# Patient Record
Sex: Male | Born: 1955 | Race: White | Hispanic: No | Marital: Married | Smoking: Former smoker
Health system: Southern US, Community
[De-identification: ages and names within clinical notes are randomized; demographics above are authoritative.]

## PROBLEM LIST (undated history)

## (undated) DIAGNOSIS — N200 Calculus of kidney: Secondary | ICD-10-CM

## (undated) DIAGNOSIS — F0390 Unspecified dementia without behavioral disturbance: Secondary | ICD-10-CM

## (undated) DIAGNOSIS — N132 Hydronephrosis with renal and ureteral calculous obstruction: Secondary | ICD-10-CM

## (undated) DIAGNOSIS — D649 Anemia, unspecified: Secondary | ICD-10-CM

## (undated) HISTORY — PX: NEPHRECTOMY: SHX65

## (undated) NOTE — *Deleted (*Deleted)
Transition of Care [facility]) - CM/SW Discharge Note   Patient Details  Name: Gerald Diaz MRN: [id number] Date of Birth: 07-15-1955  [facility]) CM/SW Contact:  Epifanio Lesches, RN Phone Number: [phone] 02/10/2020, 12:19 PM   Clinical Narrative:    Patient will DC to: home  With 24/7 caregivers Anticipated DC date: 02/10/2020 Family notified: [name] ( guardian) at [phone] Transport by: car   Per MD patient ready for DC today . RN, patient, patient's guardian,  notified of DC.  RNCM will sign off for now as intervention is no longer needed. Please consult [place] again if new needs arise.     Barriers to Discharge: No Barriers Identified   Patient Goals and CMS Choice Patient states their goals for this hospitalization and ongoing recovery are:: return home w/ caregivers CMS Medicare.gov Compare Post Acute Care list provided to::  (n/a) Choice offered to / list presented to : [facility] POA / Guardian  Discharge Placement                       Discharge Plan and Services In-house Referral: Clinical Social Work Discharge Planning Services: CM Consult Post Acute Care Choice: Resumption of Svcs/PTA Provider                               Social Determinants of Health (SDOH) Interventions     Readmission Risk Interventions Readmission Risk Prevention Plan 02/09/2020  Post Dischage Appt Not Complete  Appt Comments d/c date tbd  Medication Screening Complete  Transportation Screening Complete  Some recent data might be hidden

---

## 1997-10-11 ENCOUNTER — Ambulatory Visit (HOSPITAL_COMMUNITY): Admission: RE | Admit: 1997-10-11 | Discharge: 1997-10-11 | Payer: Self-pay

## 2001-05-01 ENCOUNTER — Emergency Department (HOSPITAL_COMMUNITY): Admission: EM | Admit: 2001-05-01 | Discharge: 2001-05-01 | Payer: Self-pay | Admitting: Emergency Medicine

## 2001-05-08 ENCOUNTER — Emergency Department (HOSPITAL_COMMUNITY): Admission: EM | Admit: 2001-05-08 | Discharge: 2001-05-08 | Payer: Self-pay | Admitting: Emergency Medicine

## 2004-02-16 ENCOUNTER — Ambulatory Visit (HOSPITAL_COMMUNITY): Admission: RE | Admit: 2004-02-16 | Discharge: 2004-02-16 | Payer: Self-pay | Admitting: Orthopedic Surgery

## 2004-02-19 ENCOUNTER — Ambulatory Visit (HOSPITAL_BASED_OUTPATIENT_CLINIC_OR_DEPARTMENT_OTHER): Admission: RE | Admit: 2004-02-19 | Discharge: 2004-02-19 | Payer: Self-pay | Admitting: Orthopedic Surgery

## 2004-10-11 ENCOUNTER — Emergency Department (HOSPITAL_COMMUNITY): Admission: EM | Admit: 2004-10-11 | Discharge: 2004-10-11 | Payer: Self-pay | Admitting: *Deleted

## 2005-06-09 ENCOUNTER — Encounter: Admission: RE | Admit: 2005-06-09 | Discharge: 2005-06-09 | Payer: Self-pay | Admitting: Sports Medicine

## 2009-02-27 ENCOUNTER — Ambulatory Visit: Payer: Self-pay | Admitting: Oncology

## 2009-03-13 LAB — CBC WITH DIFFERENTIAL (CANCER CENTER ONLY)
BASO#: 0.1 10*3/uL (ref 0.0–0.2)
BASO%: 0.8 % (ref 0.0–2.0)
EOS%: 7.8 % — ABNORMAL HIGH (ref 0.0–7.0)
Eosinophils Absolute: 0.5 10*3/uL (ref 0.0–0.5)
HCT: 43.4 % (ref 38.7–49.9)
HGB: 14.9 g/dL (ref 13.0–17.1)
LYMPH#: 1.5 10*3/uL (ref 0.9–3.3)
LYMPH%: 22.8 % (ref 14.0–48.0)
MCH: 33.6 pg — ABNORMAL HIGH (ref 28.0–33.4)
MCHC: 34.2 g/dL (ref 32.0–35.9)
MCV: 98 fL (ref 82–98)
MONO#: 0.4 10*3/uL (ref 0.1–0.9)
MONO%: 6.1 % (ref 0.0–13.0)
NEUT#: 4 10*3/uL (ref 1.5–6.5)
NEUT%: 62.5 % (ref 40.0–80.0)
Platelets: 197 10*3/uL (ref 145–400)
RBC: 4.42 10*6/uL (ref 4.20–5.70)
RDW: 11.4 % (ref 10.5–14.6)
WBC: 6.4 10*3/uL (ref 4.0–10.0)

## 2009-03-13 LAB — MORPHOLOGY - CHCC SATELLITE
PLT EST ~~LOC~~: ADEQUATE
Platelet Morphology: NORMAL

## 2009-03-13 LAB — URINALYSIS, MICROSCOPIC (CHCC SATELLITE)
Bacteria, UA: NEGATIVE
Bilirubin (Urine): NEGATIVE
Blood: NEGATIVE
Epithelial Cells: NONE SEEN
Glucose: NEGATIVE g/dL
Ketones: NEGATIVE mg/dL
Leukocyte Esterase: NEGATIVE
Mucus: NONE SEEN
Nitrite: NEGATIVE
Protein: NEGATIVE mg/dL
Specific Gravity, Urine: 1.015 (ref 1.003–1.035)
WBC: NEGATIVE (ref 0–2)
pH: 6 (ref 4.60–8.00)

## 2009-03-13 LAB — CMP (CANCER CENTER ONLY)
ALT(SGPT): 27 U/L (ref 10–47)
AST: 27 U/L (ref 11–38)
Albumin: 3.5 g/dL (ref 3.3–5.5)
Alkaline Phosphatase: 66 U/L (ref 26–84)
BUN, Bld: 27 mg/dL — ABNORMAL HIGH (ref 7–22)
CO2: 29 mEq/L (ref 18–33)
Calcium: 9.1 mg/dL (ref 8.0–10.3)
Chloride: 105 mEq/L (ref 98–108)
Creat: 0.8 mg/dl (ref 0.6–1.2)
Glucose, Bld: 86 mg/dL (ref 73–118)
Potassium: 4.6 mEq/L (ref 3.3–4.7)
Sodium: 144 mEq/L (ref 128–145)
Total Bilirubin: 0.6 mg/dl (ref 0.20–1.60)
Total Protein: 6.9 g/dL (ref 6.4–8.1)

## 2009-03-13 LAB — LACTATE DEHYDROGENASE: LDH: 141 U/L (ref 94–250)

## 2009-03-15 LAB — FOLATE: Folate: >20 ng/mL

## 2009-03-15 LAB — DIRECT ANTIGLOBULIN TEST (NOT AT ARMC)
DAT (Complement): NEGATIVE
DAT IgG: NEGATIVE

## 2009-03-15 LAB — PROTEIN ELECTROPHORESIS, SERUM
Albumin ELP: 62.4 % (ref 55.8–66.1)
Alpha-1-Globulin: 3.7 % (ref 2.9–4.9)
Alpha-2-Globulin: 8.8 % (ref 7.1–11.8)
Beta 2: 6.3 % (ref 3.2–6.5)
Beta Globulin: 5.7 % (ref 4.7–7.2)
Gamma Globulin: 13.1 % (ref 11.1–18.8)
Total Protein, Serum Electrophoresis: 6.7 g/dL (ref 6.0–8.3)

## 2009-03-15 LAB — RETICULOCYTES (CHCC)
ABS Retic: 31.6 10*3/uL (ref 19.0–186.0)
RBC.: 4.52 MIL/uL (ref 4.22–5.81)
Retic Ct Pct: 0.7 % (ref 0.4–3.1)

## 2009-03-15 LAB — HAPTOGLOBIN: Haptoglobin: 87 mg/dL (ref 16–200)

## 2009-03-15 LAB — FERRITIN: Ferritin: 111 ng/mL (ref 22–322)

## 2009-03-15 LAB — IRON AND TIBC
%SAT: 33 % (ref 20–55)
Iron: 101 ug/dL (ref 42–165)
TIBC: 310 ug/dL (ref 215–435)
UIBC: 209 ug/dL

## 2009-03-15 LAB — ERYTHROPOIETIN: Erythropoietin: 16.5 m[IU]/mL (ref 2.6–34.0)

## 2009-03-15 LAB — VITAMIN B12: Vitamin B-12: 815 pg/mL (ref 211–911)

## 2009-04-18 ENCOUNTER — Ambulatory Visit: Payer: Self-pay | Admitting: Oncology

## 2009-04-23 LAB — CBC WITH DIFFERENTIAL (CANCER CENTER ONLY)
BASO#: 0.1 10*3/uL (ref 0.0–0.2)
BASO%: 0.8 % (ref 0.0–2.0)
EOS%: 7.4 % — ABNORMAL HIGH (ref 0.0–7.0)
Eosinophils Absolute: 0.6 10*3/uL — ABNORMAL HIGH (ref 0.0–0.5)
HCT: 41.7 % (ref 38.7–49.9)
HGB: 14.1 g/dL (ref 13.0–17.1)
LYMPH#: 1.9 10*3/uL (ref 0.9–3.3)
LYMPH%: 24.4 % (ref 14.0–48.0)
MCH: 33.5 pg — ABNORMAL HIGH (ref 28.0–33.4)
MCHC: 33.9 g/dL (ref 32.0–35.9)
MCV: 99 fL — ABNORMAL HIGH (ref 82–98)
MONO#: 0.5 10*3/uL (ref 0.1–0.9)
MONO%: 6.4 % (ref 0.0–13.0)
NEUT#: 4.7 10*3/uL (ref 1.5–6.5)
NEUT%: 61 % (ref 40.0–80.0)
Platelets: 193 10*3/uL (ref 145–400)
RBC: 4.23 10*6/uL (ref 4.20–5.70)
RDW: 11.5 % (ref 10.5–14.6)
WBC: 7.7 10*3/uL (ref 4.0–10.0)

## 2010-09-19 NOTE — Op Note (Signed)
NAMEKAHLEB, MCCLANE NO.:  [id number]   MEDICAL RECORD NO.:  [id number]          PATIENT TYPE:  AMB   LOCATION:  DSC                          FACILITY:  [facility]   PHYSICIAN:  Katy Fitch. Sypher Jr., M.D.DATE OF BIRTH:  01/05/1956   DATE OF PROCEDURE:  02/19/2004  DATE OF DISCHARGE:                                 OPERATIVE REPORT   PREOPERATIVE DIAGNOSIS:  Chronic acromioclavicular arthropathy, left  shoulder, with stage 2 impingement, chronic bursitis and pain.   POSTOPERATIVE DIAGNOSIS:  Chronic acromioclavicular arthropathy, left  shoulder, with stage 2 impingement, chronic bursitis and pain.   OPERATIONS:  1.  Diagnostic arthroscopy of left glenohumeral joint with a limited      synovectomy.  2.  Arthroscopic subacromial decompression with bursectomy, coracoacromial      ligament release and acromioplasty.  3.  Open left distal clavicle resection, i.e. Mumford procedure.   OPERATING SURGEON:  Katy Fitch. Sypher, M.D.   ASSISTANT:  Jonni Sanger, P.A.   ANESTHESIA:  General endotracheal supplemented by interscalene block.   SUPERVISING ANESTHESIOLOGIST:  Burna Forts, M.D.   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.   He has a history of a right rotator cuff tear treated in 2000 with  subsequent development of impingement on the right.  He is status post right  shoulder subacromial decompression, debridement and distal clavicle  resection with resolution of his right shoulder predicament.  He recently  presented with symptoms on the left suggestive of his right rotator cuff  tear.   Clinical examination revealed signs of impingement and AC arthropathy.  An  MRI of his shoulder documented tendinoplasty of the rotator cuff without  evidence of a retracted rotator cuff tear and profound edema of the distal  clavicle and advanced arthritis of the [facility] joint with prominent inferior   osteophytes.   We recommended to Mr. Atiyeh that he undergo arthroscopic evaluation of his  shoulder followed by distal clavicle resection and subacromial  decompression.  After informed consent, he is brought to the operating room  at this time.   He is quite familiar with the postoperative protocol and understands the  risks and benefits of this procedure given his past experience.   DESCRIPTION OF PROCEDURE:  Rashon Westrup is brought to the operating room and  placed in the supine position on the operating table.   Following the induction of general orotracheal anesthesia, he was carefully  positioned in the beach chair position with the aid of a torso and head  holder designed for shoulder arthroscopy.   The entire left arm and __________ were prepped with Duraprep and draped  with impervious arthroscopy drapes.   The shoulder was distended with 20 mL of sterile saline with a 20 gauge  spinal needle brought in through the anticipated posterior portal.  The  arthroscope was placed atraumatically through the posterior portal.   Diagnostic arthroscopy revealed intact hyalin articular cartilage surfaces  on the glenoid and humeral head.   The anterior capsular ligaments were normal with  early adhesive capsulitis  tissues noted and rather redundant synovium hanging within the joint.  The  biceps origin was stable and the biceps tendon was normal through the  rotator interval.  The subscapularis insertion was normal.  The  supraspinatus insertion had some mild degenerative tendinoplasty without a  retracted tear and the infraspinatus was normal in appearance.  The  condition of the rotator cuff was documented with a digital camera.   A suction shaver was used to perform an anterior synovectomy and hemostasis  was achieved with the radiofrequency probe.   The arthroscopic equipment was removed from the glenohumeral joint and  placed in the subacromial space.   There was a florid  bursitis noted.   After bursectomy, the anatomy of the coracoacromial arch was studied.   There was a type 2 acromion with a rather prominent anteromedial lip  adjacent to the [facility] joint.  The inferior clavicle was prominent.  The capsule  of the [facility] joint was taken down with the cutting cautery followed by use of a  suction bur to level the acromion to a type 1 morphology.  The  coracoacromial ligament was released and electrocauterized with bipolar  cautery.   The bursa was thoroughly cleaned at the rotator cuff and no sign of  retracted tear was identified.   Attention was then directed to distal clavicle resection.  A 2 cm incision  was fashioned directly over the distal clavicle.  Subcutaneous tissues were  incised sharply revealing the [facility] joint capsule.  This was elevated with a 15  blade and small osteotome circumferentially to allow resection of the distal  15 mm of clavicle.   Baby Bennett retractors were placed protecting the deltoid and trapezius  muscles followed by use of an oscillating saw to remove the distal 5 mm of  the clavicle.   Bleeding points were electrocauterized with bipolar current followed by  repair of the dead space with mattress sutures of #2 fiber wire closing the  anterior deltoid muscle fibers to the trapezius posteriorly.   The skin was repaired with subdural sutures of 2-0 Vicryl.   The scope was replaced in the subacromial space and the acromion was burred  to a flat surface with the bur brought in posteriorly and visualization  laterally.  Once again a satisfactory contouring of the acromion was  documented followed by inspection of the cuff.  No sign of retracted tear  was noted.   The arthroscopic equipment was then removed followed by repair of the  portals with intradermal 3-0 Prolene and repair of the skin incision for  distal clavicle resection with intradermal 3-0 Prolene.  The wounds were dressed with Xeroflo, sterile gauze and a  Hypafix dressing.   Mr. Draheim will be discharged home with a prescription for Dilaudid 2 mg one  to two tablets p.o. q.4-6h. p.r.n. pain and also Keflex 500 mg one p.o.  q.8h. x 4 days for prophylactic antibiotic and Motrin 600 mg one p.o. q.6h.  p.r.n. pain, 30 tablets with one refill.       RVS/MEDQ  D:  02/19/2004  T:  02/19/2004  Job:  [id number]

## 2010-10-15 ENCOUNTER — Emergency Department (HOSPITAL_COMMUNITY)
Admission: EM | Admit: 2010-10-15 | Discharge: 2010-10-15 | Disposition: A | Discharge status: Home / Self Care | Payer: BC Managed Care – PPO | Attending: Emergency Medicine | Admitting: Emergency Medicine

## 2010-10-15 ENCOUNTER — Emergency Department (HOSPITAL_COMMUNITY): Payer: BC Managed Care – PPO

## 2010-10-15 DIAGNOSIS — R112 Nausea with vomiting, unspecified: Secondary | ICD-10-CM | POA: Insufficient documentation

## 2010-10-15 DIAGNOSIS — H81399 Other peripheral vertigo, unspecified ear: Secondary | ICD-10-CM | POA: Insufficient documentation

## 2010-10-15 LAB — BASIC METABOLIC PANEL
BUN: 17 mg/dL (ref 6–23)
CO2: 24 mEq/L (ref 19–32)
Calcium: 8.1 mg/dL — ABNORMAL LOW (ref 8.4–10.5)
Chloride: 107 mEq/L (ref 96–112)
Creatinine, Ser: 0.74 mg/dL (ref 0.4–1.5)
GFR calc Af Amer: >60 mL/min (ref >60)
GFR calc non Af Amer: >60 mL/min (ref >60)
Glucose, Bld: 161 mg/dL — ABNORMAL HIGH (ref 70–99)
Potassium: 3.7 mEq/L (ref 3.5–5.1)
Sodium: 141 mEq/L (ref 135–145)

## 2010-10-15 LAB — CBC
HCT: 41 % (ref 39.0–52.0)
Hemoglobin: 14.4 g/dL (ref 13.0–17.0)
MCH: 34.5 pg — ABNORMAL HIGH (ref 26.0–34.0)
MCHC: 35.1 g/dL (ref 30.0–36.0)
MCV: 98.3 fL (ref 78.0–100.0)
Platelets: 170 10*3/uL (ref 150–400)
RBC: 4.17 MIL/uL — ABNORMAL LOW (ref 4.22–5.81)
RDW: 12.5 % (ref 11.5–15.5)
WBC: 7.9 10*3/uL (ref 4.0–10.5)

## 2010-10-15 LAB — DIFFERENTIAL
Basophils Absolute: 0 10*3/uL (ref 0.0–0.1)
Basophils Relative: 1 % (ref 0–1)
Eosinophils Absolute: 0.3 10*3/uL (ref 0.0–0.7)
Eosinophils Relative: 3 % (ref 0–5)
Lymphocytes Relative: 22 % (ref 12–46)
Lymphs Abs: 1.7 10*3/uL (ref 0.7–4.0)
Monocytes Absolute: 0.7 10*3/uL (ref 0.1–1.0)
Monocytes Relative: 9 % (ref 3–12)
Neutro Abs: 5.2 10*3/uL (ref 1.7–7.7)
Neutrophils Relative %: 66 % (ref 43–77)

## 2010-10-15 LAB — ETHANOL: Alcohol, Ethyl (B): <11 mg/dL — ABNORMAL HIGH (ref 0–10)

## 2010-10-15 LAB — RAPID URINE DRUG SCREEN, HOSP PERFORMED
Amphetamines: NOT DETECTED
Barbiturates: NOT DETECTED
Benzodiazepines: NOT DETECTED
Cocaine: NOT DETECTED
Opiates: NOT DETECTED
Tetrahydrocannabinol: NOT DETECTED

## 2010-10-15 LAB — CK TOTAL AND CKMB (NOT AT ARMC)
CK, MB: 2.5 ng/mL (ref 0.3–4.0)
Relative Index: INVALID (ref 0.0–2.5)
Total CK: 78 U/L (ref 7–232)

## 2010-10-15 LAB — TROPONIN I: Troponin I: <0.3 ng/mL (ref <0.30)

## 2010-10-16 NOTE — Consult Note (Signed)
NAMELEOMAR, WESTBERG NO.:  [id number]  MEDICAL RECORD NO.:  [id number]  LOCATION:  [facility]                         FACILITY:  [facility]  PHYSICIAN:  Levie Heritage, MD       DATE OF BIRTH:  1955/08/09  DATE OF CONSULTATION:  10/15/2010 DATE OF DISCHARGE:  10/15/2010                                CONSULTATION   REFERRING PHYSICIAN:  ER Team.  REASON FOR CONSULTATION:  Code stroke.  CHIEF COMPLAINT:  Sudden vertigo.  HISTORY OF PRESENT ILLNESS:  This patient is a 55 year old man with otherwise no obvious health issues who had sudden onset of vertigo feelings since 7:30 a.m. this morning.  He states that this was triggered after he was brushing his teeth and has not gone away, since then he has thrown up many times and has no additional neurological deficit in addition to the vertigo.  PAST MEDICAL HISTORY:  He does not have any significant health issues except having some shoulder surgery and infection of the kidney as a child.  ALLERGIES:  No known drug allergies.  FAMILY HISTORY:  He denies any vascular path issues in the family with no obvious cardiac problems, strokes or diabetes mellitus.  REVIEW OF SYSTEMS:  Denies any chest pain.  Denies any shortness of breath.  Denies any pain anywhere.  Denies any motor weakness.  No issues with the vision.  No problems with the sensory system.  He does have obvious vomiting and is carrying a bag in his hand to vomit.  He is feeling nauseous.  No recent fever.  No rashes.  No recent burning urination or loss of bladder or bowel control function.  No recent flu- like symptoms.  No recent weight loss.  Rest of 10-organ review of system unremarkable.  SOCIAL HISTORY:  He is retired from [employer].  He is nonsmoker.  He is not married.  He lives alone.  Denies any alcohol abuse or drug abuse.  REVIEW OF CLINICAL DATA:  I have seen his workup today and CBC has been pretty unremarkable.  Alcohol is also normal.  Glucose is  mildly high and CK and CK-MB is normal as well as negative troponins. MRI of the brain was requested for the suspicion of acute stroke and the quick limited DWR sequence did not show any acute abnormality, although official report is pending.  PHYSICAL EXAMINATION:  Currently lying down on the bed with vomiting bag in his hand and is still throwing up. Awake, oriented x3, mild distress of vomiting and nausea repeatedly. Bilateral pupils are reactive to light and accommodation with no field cut.  Moves eyes to all direction.  Symmetrical face for sensation and strength testing.  Midline tongue without atrophy or fasciculation. Palate elevates symmetrically with midline uvula. Motor examination:  Strength is 5/5 all over. Sensory examination, admits to feel light touch sensation all over. I do not see any nystagmus.  I do not see any dysarthria.  I do not notice any aphasia. The NIH stroke scale currently is 0.  IMPRESSION:  This patient is a 55 year old man with sudden onset of nausea, vomiting and vertigo feeling started after brushing his teeth  this morning at 7:30 a.m.  His MRI of the brain is negative for any acute stroke. He was given Valium 2 mg in the ER to get through his MRI study which also to care of his nausea feeling. This good outcome with the Valium and the MRA finding in February 2007 are strongly in favor of peripheral cause of vertigo.  I do not see any central pathology otherwise. Some vascular loops touching the right vestibulocochlear nerve were noted in 2007, which could have been branches of right superior cerebellar artery. This could be the cause of his symptoms and further workup will be done in order to device management plan.  PLAN:  The patient needs an ENT evaluation for any other peripheral cause of vertigo as he did have previously pulsatile tinnitus in the past, but for now I think he will need to be discharged on p.r.n. basis of Valium and  meclizine. I think it is also important to get his CT angio of the intracranial vessels in order to evaluate further the blood vessels touching the vestibular nerve or possibly a conventional angiogram will be more helpful.  However, he needs to have a complete ENT evaluation for his symptoms first before planning any intervention. No further inpatient neurological workup, otherwise, recommended at this time.  I have discussed in detail with the ER Team about my impression in detail.  Please do not hesitate to contact me for any questions you may have on this patient.          ______________________________ Levie Heritage, MD     WS/MEDQ  D:  10/15/2010  T:  10/16/2010  Job:  [id number]  Electronically Signed by Levie Heritage MD on 10/16/2010 08:08:52 AM

## 2011-04-02 ENCOUNTER — Other Ambulatory Visit: Payer: Self-pay | Admitting: Emergency Medicine

## 2011-04-02 DIAGNOSIS — Z1211 Encounter for screening for malignant neoplasm of colon: Secondary | ICD-10-CM

## 2011-04-08 ENCOUNTER — Ambulatory Visit
Admission: RE | Admit: 2011-04-08 | Discharge: 2011-04-08 | Disposition: A | Discharge status: Home / Self Care | Payer: BC Managed Care – PPO | Source: Ambulatory Visit | Attending: Emergency Medicine | Admitting: Emergency Medicine

## 2011-04-08 DIAGNOSIS — Z1211 Encounter for screening for malignant neoplasm of colon: Secondary | ICD-10-CM

## 2016-06-17 ENCOUNTER — Encounter (HOSPITAL_COMMUNITY): Payer: Self-pay | Admitting: Emergency Medicine

## 2016-06-17 ENCOUNTER — Emergency Department (HOSPITAL_COMMUNITY)
Admission: EM | Admit: 2016-06-17 | Discharge: 2016-06-18 | Disposition: A | Discharge status: Home / Self Care | Payer: No Typology Code available for payment source | Attending: Emergency Medicine | Admitting: Emergency Medicine

## 2016-06-17 DIAGNOSIS — Y92009 Unspecified place in unspecified non-institutional (private) residence as the place of occurrence of the external cause: Secondary | ICD-10-CM | POA: Insufficient documentation

## 2016-06-17 DIAGNOSIS — Y939 Activity, unspecified: Secondary | ICD-10-CM | POA: Insufficient documentation

## 2016-06-17 DIAGNOSIS — W260XXA Contact with knife, initial encounter: Secondary | ICD-10-CM | POA: Insufficient documentation

## 2016-06-17 DIAGNOSIS — S61011A Laceration without foreign body of right thumb without damage to nail, initial encounter: Secondary | ICD-10-CM | POA: Diagnosis not present

## 2016-06-17 DIAGNOSIS — Z23 Encounter for immunization: Secondary | ICD-10-CM | POA: Diagnosis not present

## 2016-06-17 DIAGNOSIS — Y999 Unspecified external cause status: Secondary | ICD-10-CM | POA: Insufficient documentation

## 2016-06-17 HISTORY — DX: Anemia, unspecified: D64.9

## 2016-06-17 MED ORDER — TETANUS-DIPHTH-ACELL PERTUSSIS 5-2.5-18.5 LF-MCG/0.5 IM SUSP
0.5000 mL | Freq: Once | INTRAMUSCULAR | Status: AC
  Administered 2016-06-17: 0.5 mL via INTRAMUSCULAR
  Filled 2016-06-17: qty 0.5

## 2016-06-17 NOTE — Discharge Instructions (Signed)
Please read and follow all provided instructions.  Your diagnoses today include:  1. Laceration of right thumb without foreign body without damage to nail, initial encounter     Tests performed today include: Vital signs. See below for your results today.   Medications prescribed:  Take as prescribed   Home care instructions:  Follow any educational materials contained in this packet.  Follow-up instructions: Please follow-up with your primary care provider for further evaluation of symptoms and treatment   Return instructions:  Please return to the Emergency Department if you do not get better, if you get worse, or new symptoms OR  - Fever (temperature greater than 101.65F)  - Bleeding that does not stop with holding pressure to the area    -Severe pain (please note that you may be more sore the day after your accident)  - Chest Pain  - Difficulty breathing  - Severe nausea or vomiting  - Inability to tolerate food and liquids  - Passing out  - Skin becoming red around your wounds  - Change in mental status (confusion or lethargy)  - New numbness or weakness    Please return if you have any other emergent concerns.  Additional Information:  Your vital signs today were: BP 140/92 (BP Location: Right Arm)    Pulse 86    Temp 98.2 F (36.8 C) (Oral)    Resp 20    Ht 6' (1.829 m)    Wt 75.8 kg    SpO2 98%    BMI 22.65 kg/m  If your blood pressure (BP) was elevated above 135/85 this visit, please have this repeated by your doctor within one month. ---------------

## 2016-06-17 NOTE — ED Triage Notes (Signed)
Pt was putting together a model ship and was using a xacto knife and accidentally made a vertical cut on his right thumb.  Pt reports flushing it well at home.  Bleeding controlled at this time.  Unknown of when last tetanus was.

## 2016-06-17 NOTE — ED Provider Notes (Signed)
[facility]-EMERGENCY DEPT Provider Note   CSN: [id number] Arrival date & time: 06/17/16  2224     History   Chief Complaint No chief complaint on file.   HPI Gerald Diaz is a 61 y.o. male.  HPI  61 y.o. male presents to the Emergency Department today complaining of right thumb laceration. Noted using xacto knife on model ship when he made a vertical cut on this thumb. Flushed at home with water with bleeding controlled. Does not use blood thinners. Rates pain 1/10. No nail involvement. No swelling. No other symptoms noted   Past Medical History:  Diagnosis Date  . Anemia     There are no active problems to display for this patient.   Past Surgical History:  Procedure Laterality Date  . NEPHRECTOMY     pt was 10       Home Medications    Prior to Admission medications   Not on File    Family History No family history on file.  Social History Social History  Substance Use Topics  . Smoking status: Never Smoker  . Smokeless tobacco: Never Used  . Alcohol use No     Allergies   Patient has no known allergies.   Review of Systems Review of Systems  Constitutional: Negative for fever.  Skin: Positive for wound.  Neurological: Negative for numbness.     Physical Exam Updated Vital Signs BP 140/92 (BP Location: Right Arm)   Pulse 86   Temp 98.2 F (36.8 C) (Oral)   Resp 20   Ht 6' (1.829 m)   Wt 75.8 kg   SpO2 98%   BMI 22.65 kg/m   Physical Exam  Constitutional: He is oriented to person, place, and time. Vital signs are normal. He appears well-developed and well-nourished.  HENT:  Head: Normocephalic.  Right Ear: Hearing normal.  Left Ear: Hearing normal.  Eyes: Conjunctivae and EOM are normal. Pupils are equal, round, and reactive to light.  Cardiovascular: Normal rate and regular rhythm.   Pulmonary/Chest: Effort normal.  Neurological: He is alert and oriented to person, place, and time.  Skin: Skin is warm and dry.  Right palmar aspect  of thumb with 2cm linear superficial laceration. Bleeding controlled. Bottom of wound visualized. No swelling. No signs of infection  Psychiatric: He has a normal mood and affect. His speech is normal and behavior is normal. Thought content normal.     ED Treatments / Results  Labs (all labs ordered are listed, but only abnormal results are displayed) Labs Reviewed - No data to display  EKG  EKG Interpretation None       Radiology No results found.  Procedures .Marland KitchenLaceration Repair Date/Time: 06/17/2016 11:25 PM Performed by: Audry Pili Authorized by: Audry Pili   Consent:    Consent obtained:  Verbal   Consent given by:  Patient   Risks discussed:  Infection and pain   Alternatives discussed:  No treatment Laceration details:    Location:  Finger   Finger location:  R thumb   Length (cm):  2 Exploration:    Hemostasis achieved with:  Direct pressure   Wound exploration: entire depth of wound probed and visualized   Treatment:    Area cleansed with:  Hibiclens   Amount of cleaning:  Standard   Irrigation solution:  Sterile saline Skin repair:    Repair method:  Tissue adhesive Approximation:    Approximation:  Close   Vermilion border: well-aligned   Post-procedure details:  Dressing:  Open (no dressing)   Patient tolerance of procedure:  Tolerated well, no immediate complications   (including critical care time)  Medications Ordered in ED Medications - No data to display   Initial Impression / Assessment and Plan / ED Course  I have reviewed the triage vital signs and the nursing notes.  Pertinent labs & imaging results that were available during my care of the patient were reviewed by me and considered in my medical decision making (see chart for details).  Final Clinical Impressions(s) / ED Diagnoses  =   {I have reviewed the relevant previous healthcare records.  {I obtained HPI from historian.   ED Course:  Assessment: Patient is a 61yM that  presents with laceration to right humb. Tdap booster given. Pressure irrigation performed. Bottom of the wound visualized with bleeding controled. Laceration occurred < 8 hours prior to repair which was well tolerated. Pt has no co morbidities to effect normal wound healing. Closed with Dermabond. Pt is hemodynamically stable w no complaints prior to dc.     Disposition/Plan:  DC Home Additional Verbal discharge instructions given and discussed with patient.  Pt Instructed to f/u with PCP in the next week for evaluation and treatment of symptoms. Return precautions given Pt acknowledges and agrees with plan  Supervising Physician Canary Brimhristopher J Tegeler, MD  Final diagnoses:  Laceration of right thumb without foreign body without damage to nail, initial encounter    New Prescriptions New Prescriptions   No medications on file     Audry Piliyler Chinenye Katzenberger, PA-C 06/17/16 2326    Canary Brimhristopher J Tegeler, MD 06/18/16 1153

## 2019-12-19 ENCOUNTER — Telehealth: Payer: Self-pay | Admitting: Physician Assistant

## 2019-12-19 NOTE — Telephone Encounter (Signed)
Called to discuss the homebound Covid-19 vaccination initiative with the patient and/or caregiver.   Message left to call back.  Oney Tatlock PA-C  [facility]     

## 2019-12-22 ENCOUNTER — Telehealth: Payer: Self-pay | Admitting: Physician Assistant

## 2019-12-22 NOTE — Telephone Encounter (Signed)
I connected by phone with Gerald Diaz and/or patient's caregiver on 12/22/2019 at 6:53 PM to discuss the potential vaccination through our Homebound vaccination initiative.   Prevaccination Checklist for COVID-19 Vaccines  1.  Are you feeling sick today? no  2.  Have you ever received a dose of a COVID-19 vaccine?  no      If yes, which one? None   3.  Have you ever had an allergic reaction: (This would include a severe reaction [ e.g., anaphylaxis] that required treatment with epinephrine or EpiPen or that caused you to go to the hospital.  It would also include an allergic reaction that occurred within 4 hours that caused hives, swelling, or respiratory distress, including wheezing.) A.  A previous dose of COVID-19 vaccine. no  B.  A vaccine or injectable therapy that contains multiple components, one of which is a COVID-19 vaccine component, but it is not known which component elicited the immediate reaction. no  C.  Are you allergic to polyethylene glycol? no  D. Are you allergic to Polysorbate, which is found in some vaccines, film coated tablets and intravenous steroids?  no   4.  Have you ever had an allergic reaction to another vaccine (other than COVID-19 vaccine) or an injectable medication? (This would include a severe reaction [ e.g., anaphylaxis] that required treatment with epinephrine or EpiPen or that caused you to go to the hospital.  It would also include an allergic reaction that occurred within 4 hours that caused hives, swelling, or respiratory distress, including wheezing.)  no   5.  Have you ever had a severe allergic reaction (e.g., anaphylaxis) to something other than a component of the COVID-19 vaccine, or any vaccine or injectable medication?  This would include food, pet, venom, environmental, or oral medication allergies.  no   6.  Have you received any vaccine in the last 14 days? no   7.  Have you ever had a positive test for COVID-19 or has a doctor ever told you  that you had COVID-19?  no   8.  Have you received passive antibody therapy (monoclonal antibodies or convalescent serum) as a treatment for COVID-19? no   9.  Do you have a weakened immune system caused by something such as HIV infection or cancer or do you take immunosuppressive drugs or therapies?  no   10.  Do you have a bleeding disorder or are you taking a blood thinner? no   11.  Are you pregnant or breast-feeding? no   12.  Do you have dermal fillers? no   __________________   This patient is a 64 y.o. male that meets the FDA criteria to receive homebound vaccination. Patient or parent/caregiver understands they have the option to accept or refuse homebound vaccination.  Patient passed the pre-screening checklist and would like to proceed with homebound vaccination.  Based on questionnaire above, I recommend the patient be observed for 30 minutes.  There are no other household members/caregivers who are also interested in receiving the vaccine.    I will send the patient's information to our scheduling team who will reach out to schedule the patient and potential caregiver/family members for homebound vaccination.   The patient has advanced dementia and taken care of by the center for guardianship. We have limited medical history on the pt but do not think he has a history of anaphylaxis. For that reason I will have him observed for 30 min.   Cline Crock 12/22/2019 6:53  PM

## 2020-01-04 ENCOUNTER — Other Ambulatory Visit: Payer: Self-pay

## 2020-01-04 ENCOUNTER — Inpatient Hospital Stay (HOSPITAL_COMMUNITY)
Admission: EM | Admit: 2020-01-04 | Discharge: 2020-01-08 | DRG: 659 | Disposition: A | Discharge status: Home / Self Care | Payer: Medicare Other | Attending: Family Medicine | Admitting: Family Medicine

## 2020-01-04 ENCOUNTER — Emergency Department (HOSPITAL_COMMUNITY): Payer: Medicare Other

## 2020-01-04 ENCOUNTER — Encounter (HOSPITAL_COMMUNITY): Payer: Self-pay

## 2020-01-04 DIAGNOSIS — G9341 Metabolic encephalopathy: Secondary | ICD-10-CM | POA: Diagnosis present

## 2020-01-04 DIAGNOSIS — N170 Acute kidney failure with tubular necrosis: Secondary | ICD-10-CM

## 2020-01-04 DIAGNOSIS — N132 Hydronephrosis with renal and ureteral calculous obstruction: Secondary | ICD-10-CM

## 2020-01-04 DIAGNOSIS — F0390 Unspecified dementia without behavioral disturbance: Secondary | ICD-10-CM | POA: Diagnosis present

## 2020-01-04 DIAGNOSIS — X58XXXA Exposure to other specified factors, initial encounter: Secondary | ICD-10-CM | POA: Diagnosis not present

## 2020-01-04 DIAGNOSIS — Z905 Acquired absence of kidney: Secondary | ICD-10-CM

## 2020-01-04 DIAGNOSIS — Z20822 Contact with and (suspected) exposure to covid-19: Secondary | ICD-10-CM | POA: Diagnosis present

## 2020-01-04 DIAGNOSIS — E875 Hyperkalemia: Secondary | ICD-10-CM | POA: Diagnosis present

## 2020-01-04 DIAGNOSIS — N136 Pyonephrosis: Secondary | ICD-10-CM | POA: Diagnosis present

## 2020-01-04 DIAGNOSIS — R310 Gross hematuria: Secondary | ICD-10-CM | POA: Diagnosis not present

## 2020-01-04 DIAGNOSIS — N210 Calculus in bladder: Secondary | ICD-10-CM | POA: Diagnosis present

## 2020-01-04 DIAGNOSIS — S3739XA Other injury of urethra, initial encounter: Secondary | ICD-10-CM | POA: Diagnosis not present

## 2020-01-04 DIAGNOSIS — N179 Acute kidney failure, unspecified: Secondary | ICD-10-CM | POA: Diagnosis present

## 2020-01-04 DIAGNOSIS — N201 Calculus of ureter: Secondary | ICD-10-CM

## 2020-01-04 HISTORY — DX: Unspecified dementia, unspecified severity, without behavioral disturbance, psychotic disturbance, mood disturbance, and anxiety: F03.90

## 2020-01-04 LAB — CBC
HCT: 42.3 % (ref 39.0–52.0)
Hemoglobin: 14.6 g/dL (ref 13.0–17.0)
MCH: 34.1 pg — ABNORMAL HIGH (ref 26.0–34.0)
MCHC: 34.5 g/dL (ref 30.0–36.0)
MCV: 98.8 fL (ref 80.0–100.0)
Platelets: 201 10*3/uL (ref 150–400)
RBC: 4.28 MIL/uL (ref 4.22–5.81)
RDW: 12.2 % (ref 11.5–15.5)
WBC: 18.6 10*3/uL — ABNORMAL HIGH (ref 4.0–10.5)
nRBC: 0 % (ref 0.0–0.2)

## 2020-01-04 LAB — COMPREHENSIVE METABOLIC PANEL
ALT: 19 U/L (ref 0–44)
AST: 12 U/L — ABNORMAL LOW (ref 15–41)
Albumin: 4 g/dL (ref 3.5–5.0)
Alkaline Phosphatase: 76 U/L (ref 38–126)
Anion gap: 23 — ABNORMAL HIGH (ref 5–15)
BUN: 165 mg/dL — ABNORMAL HIGH (ref 8–23)
CO2: 18 mmol/L — ABNORMAL LOW (ref 22–32)
Calcium: 8.9 mg/dL (ref 8.9–10.3)
Chloride: 104 mmol/L (ref 98–111)
Creatinine, Ser: 15.31 mg/dL — ABNORMAL HIGH (ref 0.61–1.24)
GFR calc Af Amer: 3 mL/min — ABNORMAL LOW (ref >60)
GFR calc non Af Amer: 3 mL/min — ABNORMAL LOW (ref >60)
Glucose, Bld: 130 mg/dL — ABNORMAL HIGH (ref 70–99)
Potassium: 5.3 mmol/L — ABNORMAL HIGH (ref 3.5–5.1)
Sodium: 145 mmol/L (ref 135–145)
Total Bilirubin: 1.2 mg/dL (ref 0.3–1.2)
Total Protein: 7.3 g/dL (ref 6.5–8.1)

## 2020-01-04 MED ORDER — SODIUM CHLORIDE 0.9 % IV BOLUS
500.0000 mL | Freq: Once | INTRAVENOUS | Status: AC
  Administered 2020-01-04: 500 mL via INTRAVENOUS

## 2020-01-04 NOTE — ED Triage Notes (Signed)
Pt arrived via walk in, with caregivers, for evaluation of nausea and vomiting x3 days, no diarrhea. Home health nurse also stated BP was high at home. BP WNL in triage when pt still.  Denies any other sx.

## 2020-01-04 NOTE — ED Provider Notes (Signed)
[facility]-EMERGENCY DEPT Provider Note   CSN: [id number] Arrival date & time: 01/04/20  1325     History Chief Complaint  Patient presents with  . Nausea    Gerald Diaz is a 64 y.o. male.  HPI Level 5 caveat secondary to dementia   61 y o male with ho anemia and dementia presents today with reports of nausea and vomiting.  Caregiver is present.  She is she is 1 of many caregivers and is not entirely sure of what has been going on with him.  She does report that he has had some nausea and vomiting.  She is unable to tell me how many times he has vomited.  Decreased p.o. intake.  She does not know about his bowel habits she states that he does his self-care on his own. Past Medical History:  Diagnosis Date  . Anemia   . Dementia (HCC)     There are no problems to display for this patient.   Past Surgical History:  Procedure Laterality Date  . NEPHRECTOMY     pt was 10       History reviewed. No pertinent family history.  Social History   Tobacco Use  . Smoking status: Never Smoker  . Smokeless tobacco: Never Used  Substance Use Topics  . Alcohol use: No  . Drug use: No    Home Medications Prior to Admission medications   Not on File    Allergies    Patient has no known allergies.  Review of Systems   Review of Systems  All other systems reviewed and are negative.   Physical Exam Updated Vital Signs BP 130/68 (BP Location: Right Arm)   Pulse 78   Temp 97.8 F (36.6 C) (Oral)   Resp 16   SpO2 99%   Physical Exam Vitals and nursing note reviewed.  Constitutional:      Appearance: Normal appearance.  HENT:     Head: Normocephalic.     Right Ear: External ear normal.     Left Ear: External ear normal.     Nose: Nose normal.     Mouth/Throat:     Pharynx: Oropharynx is clear.  Eyes:     Pupils: Pupils are equal, round, and reactive to light.  Cardiovascular:     Rate and Rhythm: Normal rate and regular rhythm.      Pulses: Normal pulses.  Pulmonary:     Effort: Pulmonary effort is normal.     Breath sounds: Normal breath sounds.  Abdominal:     General: There is distension.     Tenderness: There is abdominal tenderness.     Comments: Abdomen appears distended. There is mild diffuse tenderness to palpation worse in the left lower quadrant  Musculoskeletal:        General: Normal range of motion.     Cervical back: Normal range of motion.  Skin:    Capillary Refill: Capillary refill takes less than 2 seconds.     Comments: Maculopapular rash noted over abdomen.  Neurological:     General: No focal deficit present.     Mental Status: He is alert.     Cranial Nerves: No cranial nerve deficit.     Sensory: No sensory deficit.     Motor: No weakness.     Coordination: Coordination normal.  Psychiatric:        Mood and Affect: Mood normal.     ED Results / Procedures / Treatments   Labs (  all labs ordered are listed, but only abnormal results are displayed) Labs Reviewed  COMPREHENSIVE METABOLIC PANEL - Abnormal; Notable for the following components:      Result Value   Potassium 5.3 (*)    CO2 18 (*)    Glucose, Bld 130 (*)    BUN 165 (*)    Creatinine, Ser 15.31 (*)    AST 12 (*)    GFR calc non Af Amer 3 (*)    GFR calc Af Amer 3 (*)    Anion gap 23 (*)    All other components within normal limits  CBC - Abnormal; Notable for the following components:   WBC 18.6 (*)    MCH 34.1 (*)    All other components within normal limits  URINALYSIS, ROUTINE W REFLEX MICROSCOPIC    EKG EKG Interpretation  Date/Time:  Thursday January 04 2020 22:55:15 EDT Ventricular Rate:  87 PR Interval:    QRS Duration: 97 QT Interval:  360 QTC Calculation: 433 R Axis:   81 Text Interpretation: Sinus rhythm Borderline right axis deviation Confirmed by Margarita Grizzle [phone]) on 01/04/2020 10:58:32 PM   Radiology CT ABDOMEN PELVIS WO CONTRAST  Result Date: 01/04/2020 CLINICAL DATA:  Abdominal  pain. EXAM: CT ABDOMEN AND PELVIS WITHOUT CONTRAST TECHNIQUE: Multidetector CT imaging of the abdomen and pelvis was performed following the standard protocol without IV contrast. Study is somewhat degraded by patient motion. COMPARISON:  04/08/2011 FINDINGS: Lower chest: No acute abnormality. Hepatobiliary: No focal liver abnormality is seen. No gallstones, gallbladder wall thickening, or biliary dilatation. Pancreas: Unremarkable. No pancreatic ductal dilatation or surrounding inflammatory changes. Spleen: Normal in size without focal abnormality. Adrenals/Urinary Tract: No adrenal masses. Left kidney is swollen with mild hydronephrosis, perinephric stranding and several small nonobstructing intrarenal stones. Proximal ureter is dilated to the level of a 9 mm proximal ureteral stone. Ureter below this is normal in course and in caliber. Right kidney has been surgically removed. Mildly distended bladder. Multiple dependent bladder stones. No wall thickening or masses. Stomach/Bowel: Normal stomach. Small bowel and colon are normal in caliber. No wall thickening. No inflammation. No evidence of appendicitis. Vascular/Lymphatic: Scattered common iliac artery atherosclerotic calcifications. No aortic atherosclerotic calcifications or aneurysm. No enlarged lymph nodes. Reproductive: Enlarged prostate, 5.7 cm in greatest transverse dimension. Other: No abdominal wall hernia.  No ascites. Musculoskeletal: No fracture or acute finding. No osteoblastic or osteolytic lesions. IMPRESSION: 1. 9 mm stone in the proximal left ureter causes moderate left hydronephrosis with left renal edema and perinephric stranding. 2. No other acute abnormality within the abdomen or pelvis. 3. Multiple small nonobstructing stones in the left kidney. 4. Small dependent bladder stones. No bladder wall thickening or masses. 5. Status post right nephrectomy. Electronically Signed   By: [name] M.D.   On: 01/04/2020 21:04     Procedures .Critical Care Performed by: Margarita Grizzle, MD Authorized by: Margarita Grizzle, MD   Critical care provider statement:    Critical care time (minutes):  45   Critical care end time:  01/04/2020 11:00 PM   Critical care was necessary to treat or prevent imminent or life-threatening deterioration of the following conditions:  Renal failure   Critical care was time spent personally by me on the following activities:  Discussions with consultants, evaluation of patient's response to treatment, examination of patient, ordering and performing treatments and interventions, ordering and review of laboratory studies, ordering and review of radiographic studies, pulse oximetry, re-evaluation of patient's condition, obtaining history from patient  or surrogate and review of old charts   (including critical care time)  Medications Ordered in ED Medications - No data to display  ED Course  I have reviewed the triage vital signs and the nursing notes.  Pertinent labs & imaging results that were available during my care of the patient were reviewed by me and considered in my medical decision making (see chart for details).    MDM Rules/Calculators/A&P                          64 year old man presents today with acute renal failure likely secondary to 9 mm stone in the proximal left ureter, status post nephrectomy right kidney. Discussed with Dr. Alvester Morin.  Covid test is currently pending.  Patient is nonvaccinated. Mild hyperkalemia, EKG pending. EKG reviewed no evidence of hyperkalemic changes noted on EKG Discussed with Dr. Toniann Fail who will see and evaluate for admission post surgery Gerald Diaz is guardian who can be contacted for permission  Final Clinical Impression(s) / ED Diagnoses Final diagnoses:  Ureteral stone with hydronephrosis  Acute renal failure, unspecified acute renal failure type [facility])    Rx / DC Orders ED Discharge Orders    None       Margarita Grizzle,  MD 01/04/20 2300

## 2020-01-05 ENCOUNTER — Inpatient Hospital Stay (HOSPITAL_COMMUNITY): Payer: Medicare Other

## 2020-01-05 ENCOUNTER — Encounter (HOSPITAL_COMMUNITY)
Admission: EM | Disposition: A | Discharge status: Home / Self Care | Payer: Self-pay | Source: Home / Self Care | Attending: Family Medicine

## 2020-01-05 ENCOUNTER — Encounter (HOSPITAL_COMMUNITY): Payer: Self-pay | Admitting: Internal Medicine

## 2020-01-05 ENCOUNTER — Inpatient Hospital Stay (HOSPITAL_COMMUNITY): Payer: Medicare Other | Admitting: Anesthesiology

## 2020-01-05 DIAGNOSIS — N170 Acute kidney failure with tubular necrosis: Secondary | ICD-10-CM

## 2020-01-05 DIAGNOSIS — N179 Acute kidney failure, unspecified: Principal | ICD-10-CM

## 2020-01-05 DIAGNOSIS — F0390 Unspecified dementia without behavioral disturbance: Secondary | ICD-10-CM | POA: Diagnosis present

## 2020-01-05 DIAGNOSIS — N201 Calculus of ureter: Secondary | ICD-10-CM | POA: Diagnosis present

## 2020-01-05 DIAGNOSIS — N132 Hydronephrosis with renal and ureteral calculous obstruction: Secondary | ICD-10-CM

## 2020-01-05 HISTORY — PX: CYSTOSCOPY W/ URETERAL STENT PLACEMENT: SHX1429

## 2020-01-05 LAB — CBC
HCT: 42.2 % (ref 39.0–52.0)
Hemoglobin: 14.3 g/dL (ref 13.0–17.0)
MCH: 33.9 pg (ref 26.0–34.0)
MCHC: 33.9 g/dL (ref 30.0–36.0)
MCV: 100 fL (ref 80.0–100.0)
Platelets: 193 10*3/uL (ref 150–400)
RBC: 4.22 MIL/uL (ref 4.22–5.81)
RDW: 11.9 % (ref 11.5–15.5)
WBC: 15.1 10*3/uL — ABNORMAL HIGH (ref 4.0–10.5)
nRBC: 0 % (ref 0.0–0.2)

## 2020-01-05 LAB — URINALYSIS, ROUTINE W REFLEX MICROSCOPIC
Bilirubin Urine: NEGATIVE
Glucose, UA: NEGATIVE mg/dL
Ketones, ur: NEGATIVE mg/dL
Nitrite: NEGATIVE
Protein, ur: 30 mg/dL — AB
RBC / HPF: >50 RBC/hpf — ABNORMAL HIGH (ref 0–5)
Specific Gravity, Urine: 1.012 (ref 1.005–1.030)
WBC, UA: >50 WBC/hpf — ABNORMAL HIGH (ref 0–5)
pH: 5 (ref 5.0–8.0)

## 2020-01-05 LAB — GLUCOSE, CAPILLARY
Glucose-Capillary: 125 mg/dL — ABNORMAL HIGH (ref 70–99)
Glucose-Capillary: 144 mg/dL — ABNORMAL HIGH (ref 70–99)
Glucose-Capillary: 153 mg/dL — ABNORMAL HIGH (ref 70–99)
Glucose-Capillary: 194 mg/dL — ABNORMAL HIGH (ref 70–99)

## 2020-01-05 LAB — BASIC METABOLIC PANEL
Anion gap: 19 — ABNORMAL HIGH (ref 5–15)
BUN: 154 mg/dL — ABNORMAL HIGH (ref 8–23)
CO2: 15 mmol/L — ABNORMAL LOW (ref 22–32)
Calcium: 8.1 mg/dL — ABNORMAL LOW (ref 8.9–10.3)
Chloride: 107 mmol/L (ref 98–111)
Creatinine, Ser: 13.45 mg/dL — ABNORMAL HIGH (ref 0.61–1.24)
GFR calc Af Amer: 4 mL/min — ABNORMAL LOW (ref >60)
GFR calc non Af Amer: 3 mL/min — ABNORMAL LOW (ref >60)
Glucose, Bld: 134 mg/dL — ABNORMAL HIGH (ref 70–99)
Potassium: 4.8 mmol/L (ref 3.5–5.1)
Sodium: 141 mmol/L (ref 135–145)

## 2020-01-05 LAB — CBG MONITORING, ED: Glucose-Capillary: 119 mg/dL — ABNORMAL HIGH (ref 70–99)

## 2020-01-05 LAB — HIV ANTIBODY (ROUTINE TESTING W REFLEX): HIV Screen 4th Generation wRfx: NONREACTIVE

## 2020-01-05 LAB — SARS CORONAVIRUS 2 BY RT PCR (HOSPITAL ORDER, PERFORMED IN ~~LOC~~ HOSPITAL LAB): SARS Coronavirus 2: NEGATIVE

## 2020-01-05 SURGERY — CYSTOSCOPY, WITH RETROGRADE PYELOGRAM AND URETERAL STENT INSERTION
Anesthesia: General | Site: Ureter | Laterality: Left

## 2020-01-05 MED ORDER — MIDAZOLAM HCL 5 MG/5ML IJ SOLN
INTRAMUSCULAR | Status: DC | PRN
  Administered 2020-01-05: 1 mg via INTRAVENOUS

## 2020-01-05 MED ORDER — FENTANYL CITRATE (PF) 100 MCG/2ML IJ SOLN
INTRAMUSCULAR | Status: DC | PRN
  Administered 2020-01-05 (×2): 50 ug via INTRAVENOUS

## 2020-01-05 MED ORDER — SODIUM CHLORIDE 0.9 % IV SOLN
1.0000 g | INTRAVENOUS | Status: DC
  Administered 2020-01-05 – 2020-01-07 (×3): 1 g via INTRAVENOUS
  Filled 2020-01-05: qty 1
  Filled 2020-01-05 (×2): qty 10
  Filled 2020-01-05: qty 1

## 2020-01-05 MED ORDER — PROPOFOL 10 MG/ML IV BOLUS
INTRAVENOUS | Status: DC | PRN
  Administered 2020-01-05: 120 mg via INTRAVENOUS

## 2020-01-05 MED ORDER — ONDANSETRON HCL 4 MG/2ML IJ SOLN: 4.0000 mg | Freq: Four times a day (QID) | INTRAMUSCULAR | Status: DC | PRN

## 2020-01-05 MED ORDER — HYDRALAZINE HCL 20 MG/ML IJ SOLN
INTRAMUSCULAR | Status: AC
  Filled 2020-01-05: qty 1

## 2020-01-05 MED ORDER — CEFAZOLIN SODIUM-DEXTROSE 2-4 GM/100ML-% IV SOLN
INTRAVENOUS | Status: AC
  Filled 2020-01-05: qty 100

## 2020-01-05 MED ORDER — IOHEXOL 300 MG/ML  SOLN
INTRAMUSCULAR | Status: DC | PRN
  Administered 2020-01-05: 10 mL via URETHRAL

## 2020-01-05 MED ORDER — FENTANYL CITRATE (PF) 100 MCG/2ML IJ SOLN
INTRAMUSCULAR | Status: AC
  Filled 2020-01-05: qty 2

## 2020-01-05 MED ORDER — SUCCINYLCHOLINE CHLORIDE 20 MG/ML IJ SOLN
INTRAMUSCULAR | Status: DC | PRN
  Administered 2020-01-05: 80 mg via INTRAVENOUS

## 2020-01-05 MED ORDER — HYDRALAZINE HCL 20 MG/ML IJ SOLN
10.0000 mg | Freq: Four times a day (QID) | INTRAMUSCULAR | Status: DC | PRN
  Administered 2020-01-05: 10 mg via INTRAVENOUS

## 2020-01-05 MED ORDER — ONDANSETRON HCL 4 MG/2ML IJ SOLN: 4.0000 mg | Freq: Once | INTRAMUSCULAR | Status: DC | PRN

## 2020-01-05 MED ORDER — 0.9 % SODIUM CHLORIDE (POUR BTL) OPTIME
TOPICAL | Status: DC | PRN
  Administered 2020-01-05: 1000 mL

## 2020-01-05 MED ORDER — DEXAMETHASONE SODIUM PHOSPHATE 10 MG/ML IJ SOLN
INTRAMUSCULAR | Status: DC | PRN
  Administered 2020-01-05: 10 mg via INTRAVENOUS

## 2020-01-05 MED ORDER — FENTANYL CITRATE (PF) 100 MCG/2ML IJ SOLN: 25.0000 ug | INTRAMUSCULAR | Status: DC | PRN

## 2020-01-05 MED ORDER — KETOROLAC TROMETHAMINE 30 MG/ML IJ SOLN: 30.0000 mg | Freq: Once | INTRAMUSCULAR | Status: DC | PRN

## 2020-01-05 MED ORDER — ONDANSETRON HCL 4 MG/2ML IJ SOLN
INTRAMUSCULAR | Status: DC | PRN
  Administered 2020-01-05: 4 mg via INTRAVENOUS

## 2020-01-05 MED ORDER — CEFAZOLIN SODIUM-DEXTROSE 2-3 GM-%(50ML) IV SOLR
INTRAVENOUS | Status: DC | PRN
  Administered 2020-01-05: 2 g via INTRAVENOUS

## 2020-01-05 MED ORDER — MIDAZOLAM HCL 2 MG/2ML IJ SOLN
INTRAMUSCULAR | Status: AC
  Filled 2020-01-05: qty 2

## 2020-01-05 MED ORDER — ONDANSETRON HCL 4 MG PO TABS: 4.0000 mg | ORAL_TABLET | Freq: Four times a day (QID) | ORAL | Status: DC | PRN

## 2020-01-05 MED ORDER — LIDOCAINE 2% (20 MG/ML) 5 ML SYRINGE
INTRAMUSCULAR | Status: DC | PRN
  Administered 2020-01-05: 100 mg via INTRAVENOUS

## 2020-01-05 MED ORDER — PROPOFOL 10 MG/ML IV BOLUS
INTRAVENOUS | Status: AC
  Filled 2020-01-05: qty 20

## 2020-01-05 MED ORDER — CHLORHEXIDINE GLUCONATE CLOTH 2 % EX PADS
6.0000 | MEDICATED_PAD | Freq: Every day | CUTANEOUS | Status: DC
  Administered 2020-01-05 – 2020-01-06 (×2): 6 via TOPICAL

## 2020-01-05 MED ORDER — SODIUM CHLORIDE 0.9 % IV SOLN: INTRAVENOUS | Status: DC | PRN

## 2020-01-05 SURGICAL SUPPLY — 22 items
BAG DRN RND TRDRP ANRFLXCHMBR (UROLOGICAL SUPPLIES) ×1
BAG URINE DRAIN 2000ML AR STRL (UROLOGICAL SUPPLIES) ×1 IMPLANT
BAG URO CATCHER STRL LF (MISCELLANEOUS) ×2 IMPLANT
CATH FOLEY 2WAY 5CC 20FR (CATHETERS) ×1 IMPLANT
CATH INTERMIT  6FR 70CM (CATHETERS) ×2 IMPLANT
CLOTH BEACON ORANGE TIMEOUT ST (SAFETY) ×2 IMPLANT
GLOVE BIO SURGEON STRL SZ7.5 (GLOVE) ×2 IMPLANT
GLOVE BIOGEL PI IND STRL 7.5 (GLOVE) IMPLANT
GLOVE BIOGEL PI IND STRL 8 (GLOVE) IMPLANT
GLOVE BIOGEL PI INDICATOR 7.5 (GLOVE) ×1
GLOVE BIOGEL PI INDICATOR 8 (GLOVE) ×1
GLOVE INDICATOR 8.0 STRL GRN (GLOVE) ×1 IMPLANT
GOWN STRL REUS W/TWL LRG LVL3 (GOWN DISPOSABLE) ×4 IMPLANT
GOWN STRL REUS W/TWL XL LVL3 (GOWN DISPOSABLE) ×2 IMPLANT
GUIDEWIRE STR DUAL SENSOR (WIRE) ×2 IMPLANT
KIT TURNOVER KIT A (KITS) ×1 IMPLANT
MANIFOLD NEPTUNE II (INSTRUMENTS) ×2 IMPLANT
PACK CYSTO (CUSTOM PROCEDURE TRAY) ×2 IMPLANT
STENT URET 6FRX26 CONTOUR (STENTS) ×1 IMPLANT
SYR 10ML LL (SYRINGE) ×1 IMPLANT
TUBING CONNECTING 10 (TUBING) ×2 IMPLANT
TUBING UROLOGY SET (TUBING) ×1 IMPLANT

## 2020-01-05 NOTE — Anesthesia Procedure Notes (Signed)
Procedure Name: Intubation Performed by: Kizzie Fantasia, CRNA Pre-anesthesia Checklist: Patient identified, Emergency Drugs available, Suction available, Patient being monitored and Timeout performed Patient Re-evaluated:Patient Re-evaluated prior to induction Oxygen Delivery Method: Circle system utilized Preoxygenation: Pre-oxygenation with 100% oxygen Induction Type: IV induction Ventilation: Mask ventilation without difficulty Laryngoscope Size: Glidescope and 4 Grade View: Grade I Tube type: Oral Tube size: 7.5 mm Number of attempts: 1 Airway Equipment and Method: Video-laryngoscopy and Stylet Placement Confirmation: ETT inserted through vocal cords under direct vision,  positive ETCO2 and breath sounds checked- equal and bilateral Secured at: 23 cm Tube secured with: Tape Dental Injury: Teeth and Oropharynx as per pre-operative assessment  Comments: Glidescope used as pt did not open mouth for airway exam d/t demenia

## 2020-01-05 NOTE — Progress Notes (Signed)
PROGRESS NOTE    Gerald Diaz  HGD:[id number] DOB: [birth year] DOA: 01/04/2020 PCP: [name], MD   Brief Narrative:  HPI: Gerald Diaz is a 64 y.o. male with history of advanced dementia lives at home with 24-hour care with care was brought to the ER after patient had intractable nausea vomiting for the last 4 days.  Patient is not able to provide much history.  Most of the history was obtained from the legal guardian.  ED Course: In the ER patient appeared hemodynamically stable Covid test was negative.  Labs show creatinine of 15.3 potassium 5.3 with BUN of 165 WBC 18.6 CT scan of the abdomen pelvis shows nonobstructing left kidney.  Note that patient has only 1 functioning kidney.  Dr. Alvester Morin of urology has been consulted and plan is to take patient to the OR.  EKG shows normal sinus rhythm.  Assessment & Plan:   Principal Problem:   ARF (acute renal failure) (HCC) Active Problems:   Left ureteral stone   Dementia (HCC)  Acute renal failure secondary to obstructive uropathy/obstructing left ureteral stone/left hydronephrosis/?  Left pyelonephritis/hyperkalemia: For some reason, UA was not collected at the time of admission.  I requested RN to collect 1.  Now this shows multiple bacteria's, leukoesterase.  CT scan yesterday showed left sided perirenal stranding consistent with possible pyelonephritis.  I think he probably has pyelonephritis.  I will start him on Rocephin 1 g for that.  Will order urine culture as well as blood culture.  He is now status post cystoscopy with left retrograde pyelogram and left ureteral stent placement by urology.  His renal function has improved slightly.  Will need daily labs for monitoring.  Hyperkalemia resolved.  Further management per urology.  Acute metabolic encephalopathy in a patient with known dementia: Patient is alert but pleasantly confused.  His caregiver was present at the bedside and according to them, patient usually is alert and oriented x4  despite of having his dementia.  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  He has a guardian.  According to them, he has been having altered mental status at least 24 to 48 hours prior to presenting to the ED on 01-04-20.  He has mittens in the hand.  We will continue his home medications.  DVT prophylaxis: SCDs Start: 01/05/20 0116   Code Status: Full Code  Family Communication: His regular caregiver present at bedside.  Plan of care discussed with patient in length with her and she verbalized understanding and agreed with it.  Status is: Inpatient  Remains inpatient appropriate because:Inpatient level of care appropriate due to severity of illness   Dispo: The patient is from: Home              Anticipated d/c is to: Home              Anticipated d/c date is: 2 days              Patient currently is not medically stable to d/c.        Estimated body mass index is 22.65 kg/m as calculated from the following:   Height as of 06/17/16: 6' (1.829 m).   Weight as of 06/17/16: 75.8 kg.      Nutritional status:               Consultants:   Urology  Procedures:   Cystoscopy with left ureteral stent placement  Antimicrobials:  Anti-infectives (From admission,  onward)   Start     Dose/Rate Route Frequency Ordered Stop   01/05/20 0337  ceFAZolin (ANCEF) 2-4 GM/100ML-% IVPB       Note to Pharmacy: Mirian Mo   : cabinet override      01/05/20 4163 01/05/20 1544         Subjective: Patient seen and examined in PACU.  He was alert but pleasantly confused.  Denied having any complaint.  Caregiver at the bedside.  Objective: Vitals:   01/05/20 1115 01/05/20 1130 01/05/20 1145 01/05/20 1226  BP:  (!) 118/106  123/85  Pulse:   91 85  Resp: (!) 32 17 20 20   Temp:    (!) 97.5 F (36.4 C)  TempSrc:    Oral  SpO2:  100% 99% 95%    Intake/Output Summary (Last 24 hours) at 01/05/2020 1322 Last data filed at 01/05/2020 1254 Gross per 24 hour  Intake 1350  ml  Output 5200 ml  Net -3850 ml   There were no vitals filed for this visit.  Examination:  General exam: Appears calm and comfortable  Respiratory system: Clear to auscultation. Respiratory effort normal. Cardiovascular system: S1 & S2 heard, RRR. No JVD, murmurs, rubs, gallops or clicks. No pedal edema. Gastrointestinal system: Abdomen is nondistended, soft and nontender. No organomegaly or masses felt. Normal bowel sounds heard. Central nervous system: Alert and oriented. No focal neurological deficits. Extremities: Symmetric 5 x 5 power. Skin: No rashes, lesions or ulcers    Data Reviewed: I have personally reviewed following labs and imaging studies  CBC: Recent Labs  Lab 01/04/20 1409 01/05/20 0555  WBC 18.6* 15.1*  HGB 14.6 14.3  HCT 42.3 42.2  MCV 98.8 100.0  PLT 201 193   Basic Metabolic Panel: Recent Labs  Lab 01/04/20 1409 01/05/20 0555  NA 145 141  K 5.3* 4.8  CL 104 107  CO2 18* 15*  GLUCOSE 130* 134*  BUN 165* 154*  CREATININE 15.31* 13.45*  CALCIUM 8.9 8.1*   GFR: CrCl cannot be calculated (Unknown ideal weight.). Liver Function Tests: Recent Labs  Lab 01/04/20 1409  AST 12*  ALT 19  ALKPHOS 76  BILITOT 1.2  PROT 7.3  ALBUMIN 4.0   No results for input(s): LIPASE, AMYLASE in the last 168 hours. No results for input(s): AMMONIA in the last 168 hours. Coagulation Profile: No results for input(s): INR, PROTIME in the last 168 hours. Cardiac Enzymes: No results for input(s): CKTOTAL, CKMB, CKMBINDEX, TROPONINI in the last 168 hours. BNP (last 3 results) No results for input(s): PROBNP in the last 8760 hours. HbA1C: No results for input(s): HGBA1C in the last 72 hours. CBG: Recent Labs  Lab 01/05/20 0143 01/05/20 0554 01/05/20 1235  GLUCAP 119* 125* 144*   Lipid Profile: No results for input(s): CHOL, HDL, LDLCALC, TRIG, CHOLHDL, LDLDIRECT in the last 72 hours. Thyroid Function Tests: No results for input(s): TSH, T4TOTAL,  FREET4, T3FREE, THYROIDAB in the last 72 hours. Anemia Panel: No results for input(s): VITAMINB12, FOLATE, FERRITIN, TIBC, IRON, RETICCTPCT in the last 72 hours. Sepsis Labs: No results for input(s): PROCALCITON, LATICACIDVEN in the last 168 hours.  Recent Results (from the past 240 hour(s))  SARS Coronavirus 2 by RT PCR (hospital order, performed in [facility] hospital lab) Nasopharyngeal Nasopharyngeal Swab     Status: None   Collection Time: 01/04/20 10:34 PM   Specimen: Nasopharyngeal Swab  Result Value Ref Range Status   SARS Coronavirus 2 NEGATIVE NEGATIVE Final    Comment: (  NOTE) SARS-CoV-2 target nucleic acids are NOT DETECTED.  The SARS-CoV-2 RNA is generally detectable in upper and lower respiratory specimens during the acute phase of infection. The lowest concentration of SARS-CoV-2 viral copies this assay can detect is 250 copies / mL. A negative result does not preclude SARS-CoV-2 infection and should not be used as the sole basis for treatment or other patient management decisions.  A negative result may occur with improper specimen collection / handling, submission of specimen other than nasopharyngeal swab, presence of viral mutation(s) within the areas targeted by this assay, and inadequate number of viral copies (<250 copies / mL). A negative result must be combined with clinical observations, patient history, and epidemiological information.  Fact Sheet for Patients:   BoilerBrush.com.cy  Fact Sheet for Healthcare Providers: https://pope.com/  This test is not yet approved or  cleared by the [place] FDA and has been authorized for detection and/or diagnosis of SARS-CoV-2 by FDA under an Emergency Use Authorization (EUA).  This EUA will remain in effect (meaning this test can be used) for the duration of the COVID-19 declaration under Section 564(b)(1) of the Act, 21 U.S.C. section 360bbb-3(b)(1), unless the  authorization is terminated or revoked sooner.  Performed at [facility], [address], [place], [place] [address]       Radiology Studies: CT ABDOMEN PELVIS WO CONTRAST  Result Date: 01/04/2020 CLINICAL DATA:  Abdominal pain. EXAM: CT ABDOMEN AND PELVIS WITHOUT CONTRAST TECHNIQUE: Multidetector CT imaging of the abdomen and pelvis was performed following the standard protocol without IV contrast. Study is somewhat degraded by patient motion. COMPARISON:  04/08/2011 FINDINGS: Lower chest: No acute abnormality. Hepatobiliary: No focal liver abnormality is seen. No gallstones, gallbladder wall thickening, or biliary dilatation. Pancreas: Unremarkable. No pancreatic ductal dilatation or surrounding inflammatory changes. Spleen: Normal in size without focal abnormality. Adrenals/Urinary Tract: No adrenal masses. Left kidney is swollen with mild hydronephrosis, perinephric stranding and several small nonobstructing intrarenal stones. Proximal ureter is dilated to the level of a 9 mm proximal ureteral stone. Ureter below this is normal in course and in caliber. Right kidney has been surgically removed. Mildly distended bladder. Multiple dependent bladder stones. No wall thickening or masses. Stomach/Bowel: Normal stomach. Small bowel and colon are normal in caliber. No wall thickening. No inflammation. No evidence of appendicitis. Vascular/Lymphatic: Scattered common iliac artery atherosclerotic calcifications. No aortic atherosclerotic calcifications or aneurysm. No enlarged lymph nodes. Reproductive: Enlarged prostate, 5.7 cm in greatest transverse dimension. Other: No abdominal wall hernia.  No ascites. Musculoskeletal: No fracture or acute finding. No osteoblastic or osteolytic lesions. IMPRESSION: 1. 9 mm stone in the proximal left ureter causes moderate left hydronephrosis with left renal edema and perinephric stranding. 2. No other acute abnormality within the abdomen or pelvis. 3.  Multiple small nonobstructing stones in the left kidney. 4. Small dependent bladder stones. No bladder wall thickening or masses. 5. Status post right nephrectomy. Electronically Signed   By: [name] M.D.   On: 01/04/2020 21:04   DG C-Arm 1-60 Min-No Report  Result Date: 01/05/2020 Fluoroscopy was utilized by the requesting physician.  No radiographic interpretation.    Scheduled Meds: . hydrALAZINE       Continuous Infusions: . ceFAZolin       LOS: 1 day   Time spent: 35 minutes   Hughie Closs, MD [facility]  01/05/2020, 1:22 PM   To contact the attending provider between 7A-7P or the covering provider during after hours 7P-7A, please log into  the web site [email or web address].

## 2020-01-05 NOTE — Op Note (Signed)
Operative Note  Preoperative diagnosis:  1.  Left ureteral calculus 2.  Solitary left kidney 3.  Acute renal insufficiency 4.  Bladder calculi  Post operative diagnosis: Same  Procedure(s): 1.  Cystoscopy with left retrograde pyelogram and left ureteral stent placement  Surgeon: Modena Slater, MD  Assistants: None  Anesthesia: General  Complications: None immediate  EBL: Minimal  Specimens: 1.  None  Drains/Catheters: 1.  6 X 26 double-J ureteral stent 2.  Foley catheter  Intraoperative findings: 1.  Normal urethra.  Bladder was moderately trabeculated with multiple small bladder calculi and a larger calculus.  2.  Left retrograde pyelogram revealed a filling defect at the level of the stone with upstream hydroureteronephrosis  Indication: 64 year old male with a solitary left kidney found to have an obstructing left ureteral stone and a creatinine of 15.  He presents for urgent ureteral stent placement.  Description of procedure:  The patient was identified and consent was obtained.  The patient was taken to the operating room and placed in the supine position.  The patient was placed under general anesthesia.  Perioperative antibiotics were administered.  The patient was placed in dorsal lithotomy.  Patient was prepped and draped in a standard sterile fashion and a timeout was performed.  A 21 French rigid cystoscope was advanced into the urethra and into the bladder.  The left distal most portion of the ureter was cannulated with an open-ended ureteral catheter.  Retrograde pyelogram was performed with the findings noted above.  A sensor wire was then advanced up to the kidney under fluoroscopic guidance.  A 6 X 26 double-J ureteral stent was advanced up to the kidney under fluoroscopic guidance.  The wire was withdrawn and fluoroscopy confirmed good proximal placement and direct visualization confirmed a good coil within the bladder.  The scope was withdrawn.  A 20 French  Foley catheter was placed.  This concluded the operation.  Patient tolerated procedure well and was stable postoperatively.  Plan: Continue to monitor renal function.  He can undergo a voiding trial once creatinine improves an appropriate amount.  He will need to be scheduled in the future for left ureteroscopy with laser lithotripsy and ureteral stent exchange as well as a cystolitholapaxy

## 2020-01-05 NOTE — Progress Notes (Signed)
Update given to pts legal guardian (Dorian). Legal guardian stated that if patient were to go home with foley that he would need to be d/ced to a SNF because his caregivers are not equipped to care for anything medical related.

## 2020-01-05 NOTE — H&P (Signed)
History and Physical    Gerald Diaz:[id number] DOB: [birth year] DOA: 01/04/2020  PCP: [name], MD  Patient coming from: Home.  History obtained from patient's legal guardian.  Patient has dementia.  Chief Complaint: Nausea vomiting.  HPI: Gerald Diaz is a 64 y.o. male with history of advanced dementia lives at home with 24-hour care with care was brought to the ER after patient had intractable nausea vomiting for the last 4 days.  Patient is not able to provide much history.  Most of the history was obtained from the legal guardian.  ED Course: In the ER patient appeared hemodynamically stable Covid test was negative.  Labs show creatinine of 15.3 potassium 5.3 with BUN of 165 WBC 18.6 CT scan of the abdomen pelvis shows nonobstructing left kidney.  Note that patient has only 1 functioning kidney.  Dr. Alvester Morin of urology has been consulted and plan is to take patient to the OR.  EKG shows normal sinus rhythm.  Review of Systems: As per HPI, rest all negative.   Past Medical History:  Diagnosis Date  . Anemia   . Dementia [facility])     Past Surgical History:  Procedure Laterality Date  . NEPHRECTOMY     pt was 10     reports that he has never smoked. He has never used smokeless tobacco. He reports that he does not drink alcohol and does not use drugs.  No Known Allergies  Family History  Family history unknown: Yes    Prior to Admission medications   Medication Sig Start Date End Date Taking? Authorizing Provider  donepezil (ARICEPT) 10 MG tablet Take 10 mg by mouth at bedtime.  11/10/19  Yes [provider]  sertraline (ZOLOFT) 25 MG tablet Take 25 mg by mouth daily. 01/01/20  Yes [provider]    Physical Exam: Constitutional: Moderately built and nourished. Vitals:   01/04/20 1743 01/04/20 2300 01/05/20 0000 01/05/20 0042  BP: 130/68 (!) 141/94  131/86  Pulse: 78 87    Resp: 16 (!) 21  19  Temp:      TempSrc:      SpO2: 99% 99% 99% 98%    Eyes: Anicteric no pallor. ENMT: No discharge from the ears eyes nose or mouth. Neck: No mass felt.  No neck rigidity. Respiratory: No rhonchi or crepitations. Cardiovascular: S1-S2 heard. Abdomen: Soft nontender bowel sounds present. Musculoskeletal: No edema. Skin: No rash. Neurologic: Alert awake oriented to his name.  Moves all extremities. Psychiatric: Has advanced dementia.   Labs on Admission: I have personally reviewed following labs and imaging studies  CBC: Recent Labs  Lab 01/04/20 1409  WBC 18.6*  HGB 14.6  HCT 42.3  MCV 98.8  PLT 201   Basic Metabolic Panel: Recent Labs  Lab 01/04/20 1409  NA 145  K 5.3*  CL 104  CO2 18*  GLUCOSE 130*  BUN 165*  CREATININE 15.31*  CALCIUM 8.9   GFR: CrCl cannot be calculated (Unknown ideal weight.). Liver Function Tests: Recent Labs  Lab 01/04/20 1409  AST 12*  ALT 19  ALKPHOS 76  BILITOT 1.2  PROT 7.3  ALBUMIN 4.0   No results for input(s): LIPASE, AMYLASE in the last 168 hours. No results for input(s): AMMONIA in the last 168 hours. Coagulation Profile: No results for input(s): INR, PROTIME in the last 168 hours. Cardiac Enzymes: No results for input(s): CKTOTAL, CKMB, CKMBINDEX, TROPONINI in the last 168 hours. BNP (last 3 results) No results for  input(s): PROBNP in the last 8760 hours. HbA1C: No results for input(s): HGBA1C in the last 72 hours. CBG: No results for input(s): GLUCAP in the last 168 hours. Lipid Profile: No results for input(s): CHOL, HDL, LDLCALC, TRIG, CHOLHDL, LDLDIRECT in the last 72 hours. Thyroid Function Tests: No results for input(s): TSH, T4TOTAL, FREET4, T3FREE, THYROIDAB in the last 72 hours. Anemia Panel: No results for input(s): VITAMINB12, FOLATE, FERRITIN, TIBC, IRON, RETICCTPCT in the last 72 hours. Urine analysis:    Component Value Date/Time   LABSPEC 1.015 03/13/2009 1428   Sepsis Labs: @LABRCNTIP (procalcitonin:4,lacticidven:4) ) Recent Results (from the  past 240 hour(s))  SARS Coronavirus 2 by RT PCR (hospital order, performed in [facility] hospital lab) Nasopharyngeal Nasopharyngeal Swab     Status: None   Collection Time: 01/04/20 10:34 PM   Specimen: Nasopharyngeal Swab  Result Value Ref Range Status   SARS Coronavirus 2 NEGATIVE NEGATIVE Final    Comment: (NOTE) SARS-CoV-2 target nucleic acids are NOT DETECTED.  The SARS-CoV-2 RNA is generally detectable in upper and lower respiratory specimens during the acute phase of infection. The lowest concentration of SARS-CoV-2 viral copies this assay can detect is 250 copies / mL. A negative result does not preclude SARS-CoV-2 infection and should not be used as the sole basis for treatment or other patient management decisions.  A negative result may occur with improper specimen collection / handling, submission of specimen other than nasopharyngeal swab, presence of viral mutation(s) within the areas targeted by this assay, and inadequate number of viral copies (<250 copies / mL). A negative result must be combined with clinical observations, patient history, and epidemiological information.  Fact Sheet for Patients:   03/05/20  Fact Sheet for Healthcare Providers: BoilerBrush.com.cy  This test is not yet approved or  cleared by the https://pope.com/ FDA and has been authorized for detection and/or diagnosis of SARS-CoV-2 by FDA under an Emergency Use Authorization (EUA).  This EUA will remain in effect (meaning this test can be used) for the duration of the COVID-19 declaration under Section 564(b)(1) of the Act, 21 U.S.C. section 360bbb-3(b)(1), unless the authorization is terminated or revoked sooner.  Performed at [facility], [address], [place], [place]      Radiological Exams on Admission: CT ABDOMEN PELVIS WO CONTRAST  Result Date: 01/04/2020 CLINICAL DATA:  Abdominal pain. EXAM: CT  ABDOMEN AND PELVIS WITHOUT CONTRAST TECHNIQUE: Multidetector CT imaging of the abdomen and pelvis was performed following the standard protocol without IV contrast. Study is somewhat degraded by patient motion. COMPARISON:  04/08/2011 FINDINGS: Lower chest: No acute abnormality. Hepatobiliary: No focal liver abnormality is seen. No gallstones, gallbladder wall thickening, or biliary dilatation. Pancreas: Unremarkable. No pancreatic ductal dilatation or surrounding inflammatory changes. Spleen: Normal in size without focal abnormality. Adrenals/Urinary Tract: No adrenal masses. Left kidney is swollen with mild hydronephrosis, perinephric stranding and several small nonobstructing intrarenal stones. Proximal ureter is dilated to the level of a 9 mm proximal ureteral stone. Ureter below this is normal in course and in caliber. Right kidney has been surgically removed. Mildly distended bladder. Multiple dependent bladder stones. No wall thickening or masses. Stomach/Bowel: Normal stomach. Small bowel and colon are normal in caliber. No wall thickening. No inflammation. No evidence of appendicitis. Vascular/Lymphatic: Scattered common iliac artery atherosclerotic calcifications. No aortic atherosclerotic calcifications or aneurysm. No enlarged lymph nodes. Reproductive: Enlarged prostate, 5.7 cm in greatest transverse dimension. Other: No abdominal wall hernia.  No ascites. Musculoskeletal: No fracture or  acute finding. No osteoblastic or osteolytic lesions. IMPRESSION: 1. 9 mm stone in the proximal left ureter causes moderate left hydronephrosis with left renal edema and perinephric stranding. 2. No other acute abnormality within the abdomen or pelvis. 3. Multiple small nonobstructing stones in the left kidney. 4. Small dependent bladder stones. No bladder wall thickening or masses. 5. Status post right nephrectomy. Electronically Signed   By: [name] M.D.   On: 01/04/2020 21:04    EKG: Independently reviewed.   Normal sinus rhythm.  Assessment/Plan Principal Problem:   ARF (acute renal failure) (HCC) Active Problems:   Left ureteral stone   Dementia (HCC)    1. Acute renal failure with mild hyperkalemia secondary to obstructive uropathy with single functioning kidney for which urologist Dr. Alvester Morin has been consulted and patient be taken to the OR for possible stent placement.  We will keep patient n.p.o. in anticipation. 2. History of dementia presently on sertraline.  No acute issues. 3. Leukocytosis could be reactionary.  Since patient has severe acute renal failure with obstruction will need close monitoring for any further worsening in inpatient status.   DVT prophylaxis: SCDs.  Avoiding anticoagulation in anticipation of procedure. Code Status: Full code as confirmed with patient's legal guardian. Family Communication: Legal guardian. Disposition Plan: Home. Consults called: Urology. Admission status: Inpatient.   Eduard Clos MD [facility] Pager [phone].  If 7PM-7AM, please contact night-coverage [email or web address] Password [email or web address]  01/05/2020, 1:17 AM

## 2020-01-05 NOTE — Discharge Instructions (Signed)

## 2020-01-05 NOTE — Consult Note (Signed)
H&P Physician requesting consult: Midge Minium  Chief Complaint: Left ureteral stone, solitary kidney, acute renal failure  History of Present Illness: 64 year old male with advanced dementia presented with a several day history of nausea and vomiting.  Due to dementia, he is not able to provide much history.  He was found to have a creatinine of 15.3 and leukocytosis of 18.6.  CT of the abdomen pelvis showed a solitary left kidney with a proximal left 9 mm ureteral calculus with upstream hydronephrosis.  Patient currently has no complaints.  Past Medical History:  Diagnosis Date  . Anemia   . Dementia [facility])    Past Surgical History:  Procedure Laterality Date  . NEPHRECTOMY     pt was 10    Home Medications:  Medications Prior to Admission  Medication Sig Dispense Refill Last Dose  . donepezil (ARICEPT) 10 MG tablet Take 10 mg by mouth at bedtime.    Past Week at Unknown time  . sertraline (ZOLOFT) 25 MG tablet Take 25 mg by mouth daily.   Past Week at Unknown time   Allergies: No Known Allergies  Family History  Family history unknown: Yes   Social History:  reports that he has never smoked. He has never used smokeless tobacco. He reports that he does not drink alcohol and does not use drugs.  ROS: A complete review of systems was performed.  All systems are negative except for pertinent findings as noted. ROS   Physical Exam:  Vital signs in last 24 hours: Temp:  [97.8 F (36.6 C)-98.8 F (37.1 C)] 98.8 F (37.1 C) (09/03 0300) Pulse Rate:  [69-94] 74 (09/03 0300) Resp:  [16-21] 17 (09/03 0300) BP: (130-141)/(68-94) 141/72 (09/03 0300) SpO2:  [95 %-99 %] 98 % (09/03 0300) General:  Alert and oriented, No acute distress HEENT: Normocephalic, atraumatic Neck: No JVD or lymphadenopathy Cardiovascular: Regular rate and rhythm Lungs: Regular rate and effort Abdomen: Soft, nontender, nondistended, no abdominal masses Back: No CVA tenderness Extremities: No  edema Neurologic: Grossly intact  Laboratory Data:  Results for orders placed or performed during the hospital encounter of 01/04/20 (from the past 24 hour(s))  Comprehensive metabolic panel     Status: Abnormal   Collection Time: 01/04/20  2:09 PM  Result Value Ref Range   Sodium 145 135 - 145 mmol/L   Potassium 5.3 (H) 3.5 - 5.1 mmol/L   Chloride 104 98 - 111 mmol/L   CO2 18 (L) 22 - 32 mmol/L   Glucose, Bld 130 (H) 70 - 99 mg/dL   BUN 956 (H) 8 - 23 mg/dL   Creatinine, Ser 21.30 (H) 0.61 - 1.24 mg/dL   Calcium 8.9 8.9 - 86.5 mg/dL   Total Protein 7.3 6.5 - 8.1 g/dL   Albumin 4.0 3.5 - 5.0 g/dL   AST 12 (L) 15 - 41 U/L   ALT 19 0 - 44 U/L   Alkaline Phosphatase 76 38 - 126 U/L   Total Bilirubin 1.2 0.3 - 1.2 mg/dL   GFR calc non Af Amer 3 (L) >60 mL/min   GFR calc Af Amer 3 (L) >60 mL/min   Anion gap 23 (H) 5 - 15  CBC     Status: Abnormal   Collection Time: 01/04/20  2:09 PM  Result Value Ref Range   WBC 18.6 (H) 4.0 - 10.5 K/uL   RBC 4.28 4.22 - 5.81 MIL/uL   Hemoglobin 14.6 13.0 - 17.0 g/dL   HCT 78.4 39 - 52 %  MCV 98.8 80.0 - 100.0 fL   MCH 34.1 (H) 26.0 - 34.0 pg   MCHC 34.5 30.0 - 36.0 g/dL   RDW 54.6 50.3 - 54.6 %   Platelets 201 150 - 400 K/uL   nRBC 0.0 0.0 - 0.2 %  SARS Coronavirus 2 by RT PCR (hospital order, performed in [facility] hospital lab) Nasopharyngeal Nasopharyngeal Swab     Status: None   Collection Time: 01/04/20 10:34 PM   Specimen: Nasopharyngeal Swab  Result Value Ref Range   SARS Coronavirus 2 NEGATIVE NEGATIVE  CBG monitoring, ED     Status: Abnormal   Collection Time: 01/05/20  1:43 AM  Result Value Ref Range   Glucose-Capillary 119 (H) 70 - 99 mg/dL   Recent Results (from the past 240 hour(s))  SARS Coronavirus 2 by RT PCR (hospital order, performed in [facility] hospital lab) Nasopharyngeal Nasopharyngeal Swab     Status: None   Collection Time: 01/04/20 10:34 PM   Specimen: Nasopharyngeal Swab  Result Value Ref Range Status    SARS Coronavirus 2 NEGATIVE NEGATIVE Final    Comment: (NOTE) SARS-CoV-2 target nucleic acids are NOT DETECTED.  The SARS-CoV-2 RNA is generally detectable in upper and lower respiratory specimens during the acute phase of infection. The lowest concentration of SARS-CoV-2 viral copies this assay can detect is 250 copies / mL. A negative result does not preclude SARS-CoV-2 infection and should not be used as the sole basis for treatment or other patient management decisions.  A negative result may occur with improper specimen collection / handling, submission of specimen other than nasopharyngeal swab, presence of viral mutation(s) within the areas targeted by this assay, and inadequate number of viral copies (<250 copies / mL). A negative result must be combined with clinical observations, patient history, and epidemiological information.  Fact Sheet for Patients:   BoilerBrush.com.cy  Fact Sheet for Healthcare Providers: https://pope.com/  This test is not yet approved or  cleared by the [place] FDA and has been authorized for detection and/or diagnosis of SARS-CoV-2 by FDA under an Emergency Use Authorization (EUA).  This EUA will remain in effect (meaning this test can be used) for the duration of the COVID-19 declaration under Section 564(b)(1) of the Act, 21 U.S.C. section 360bbb-3(b)(1), unless the authorization is terminated or revoked sooner.  Performed at [facility], [address], [place], [place] [address]    Creatinine: Recent Labs    01/04/20 1409  CREATININE 15.31*    Impression/Assessment:  Left ureteral calculus Ureteral obstruction secondary to calculus Acute renal insufficiency  Plan:  Plan for urgent ureteral stent placement.  Risk and benefits discussed with his legal guardian over the phone.  Consent obtained and witnessed by nurse.  Hopefully he will have improvement of  renal function with ureteral stent placement be able to avoid dialysis.  Ray Church, III 01/05/2020, 3:38 AM

## 2020-01-05 NOTE — Anesthesia Preprocedure Evaluation (Signed)
Anesthesia Evaluation  Patient identified by MRN, date of birth, ID band Patient awake    Reviewed: Allergy & Precautions, NPO status , Patient's Chart, lab work & pertinent test results  Airway Mallampati: II  TM Distance: >3 FB Neck ROM: Full    Dental no notable dental hx.    Pulmonary neg pulmonary ROS,    Pulmonary exam normal breath sounds clear to auscultation       Cardiovascular negative cardio ROS Normal cardiovascular exam Rhythm:Regular Rate:Normal     Neuro/Psych PSYCHIATRIC DISORDERS Dementia negative neurological ROS     GI/Hepatic negative GI ROS, Neg liver ROS,   Endo/Other  negative endocrine ROS  Renal/GU ARFRenal disease  negative genitourinary   Musculoskeletal negative musculoskeletal ROS (+)   Abdominal   Peds negative pediatric ROS (+)  Hematology negative hematology ROS (+)   Anesthesia Other Findings   Reproductive/Obstetrics negative OB ROS                             Anesthesia Physical Anesthesia Plan  ASA: IV and emergent  Anesthesia Plan: General   Post-op Pain Management:    Induction: Intravenous  PONV Risk Score and Plan: 2 and Ondansetron, Dexamethasone and Treatment may vary due to age or medical condition  Airway Management Planned: Oral ETT  Additional Equipment:   Intra-op Plan:   Post-operative Plan: Extubation in OR  Informed Consent: I have reviewed the patients History and Physical, chart, labs and discussed the procedure including the risks, benefits and alternatives for the proposed anesthesia with the patient or authorized representative who has indicated his/her understanding and acceptance.     Dental advisory given  Plan Discussed with: CRNA and Surgeon  Anesthesia Plan Comments:         Anesthesia Quick Evaluation

## 2020-01-05 NOTE — Progress Notes (Signed)
Urology Inpatient Progress Report  ARF (acute renal failure) (HCC) [N17.9] Ureteral stone with hydronephrosis [N13.2] Acute renal failure, unspecified acute renal failure type (HCC) [N17.9]  Procedure(s): CYSTOSCOPY WITH RETROGRADE PYELOGRAM/URETERAL STENT PLACEMENT  Day of Surgery   Intv/Subj: No acute events overnight. Patient is without complaint. Creatinine slightly improved this morning.  He has reduced over 4 L of urine this shift consistent with postobstructive diuresis.  Foley catheter draining well and he has no complaints.  Principal Problem:   ARF (acute renal failure) (HCC) Active Problems:   Left ureteral stone   Dementia (HCC)  Current Facility-Administered Medications  Medication Dose Route Frequency Provider Last Rate Last Admin  . cefTRIAXone (ROCEPHIN) 1 g in sodium chloride 0.9 % 100 mL IVPB  1 g Intravenous Q24H Pahwani, Daleen Bo, MD 200 mL/hr at 01/05/20 1723 1 g at 01/05/20 1723  . Chlorhexidine Gluconate Cloth 2 % PADS 6 each  6 each Topical Daily Hughie Closs, MD   6 each at 01/05/20 1447  . hydrALAZINE (APRESOLINE) injection 10 mg  10 mg Intravenous Q6H PRN Hughie Closs, MD   10 mg at 01/05/20 0826  . ondansetron (ZOFRAN) tablet 4 mg  4 mg Oral Q6H PRN Eduard Clos, MD       Or  . ondansetron [facility]) injection 4 mg  4 mg Intravenous Q6H PRN Eduard Clos, MD       Facility-Administered Medications Ordered in Other Encounters  Medication Dose Route Frequency Provider Last Rate Last Admin  . 0.9 %  sodium chloride infusion   Intravenous Continuous PRN Kizzie Fantasia, CRNA   New Bag at 01/05/20 [phone]  . ceFAZolin (ANCEF) IVPB 2 g/50 mL premix   Intravenous Anesthesia Intra-op Kizzie Fantasia, CRNA   2 g at 01/05/20 0357  . dexamethasone (DECADRON) injection   Intravenous Anesthesia Intra-op Kizzie Fantasia, CRNA   10 mg at 01/05/20 0405  . fentaNYL (SUBLIMAZE) injection   Intravenous Anesthesia Intra-op Kizzie Fantasia, CRNA   50 mcg at  01/05/20 0400  . lidocaine 2% (20 mg/mL) 5 mL syringe   Intravenous Anesthesia Intra-op Kizzie Fantasia, CRNA   100 mg at 01/05/20 0351  . midazolam (VERSED) 5 MG/5ML injection   Intravenous Anesthesia Intra-op Kizzie Fantasia, CRNA   1 mg at 01/05/20 0346  . ondansetron (ZOFRAN) injection   Intravenous Anesthesia Intra-op Kizzie Fantasia, CRNA   4 mg at 01/05/20 0405  . propofol (DIPRIVAN) 10 mg/mL bolus/IV push   Intravenous Anesthesia Intra-op Kizzie Fantasia, CRNA   120 mg at 01/05/20 0351  . succinylcholine (ANECTINE) injection   Intravenous Anesthesia Intra-op Kizzie Fantasia, CRNA   80 mg at 01/05/20 0351     Objective: Vital: Vitals:   01/05/20 1145 01/05/20 1226 01/05/20 1603 01/05/20 1651  BP:  123/85 123/83   Pulse: 91 85 85   Resp: 20 20 20    Temp:  (!) 97.5 F (36.4 C) 98 F (36.7 C)   TempSrc:  Oral Oral   SpO2: 99% 95% 97%   Weight:    76.1 kg   I/Os: I/O last 3 completed shifts: In: 550 [I.V.:500; IV Piggyback:50] Out: 1900 [Urine:1900]  Physical Exam:  General: Patient is in no apparent distress Lungs: Normal respiratory effort, chest expands symmetrically. GI:The abdomen is soft and nontender without mass. Foley: Draining clear yellow urine Ext: lower extremities symmetric  Lab Results: Recent Labs    01/04/20 1409 01/05/20 0555  WBC 18.6* 15.1*  HGB 14.6  14.3  HCT 42.3 42.2   Recent Labs    01/04/20 1409 01/05/20 0555  NA 145 141  K 5.3* 4.8  CL 104 107  CO2 18* 15*  GLUCOSE 130* 134*  BUN 165* 154*  CREATININE 15.31* 13.45*  CALCIUM 8.9 8.1*   No results for input(s): LABPT, INR in the last 72 hours. No results for input(s): LABURIN in the last 72 hours. Results for orders placed or performed during the hospital encounter of 01/04/20  SARS Coronavirus 2 by RT PCR (hospital order, performed in [facility] hospital lab) Nasopharyngeal Nasopharyngeal Swab     Status: None   Collection Time: 01/04/20 10:34 PM   Specimen:  Nasopharyngeal Swab  Result Value Ref Range Status   SARS Coronavirus 2 NEGATIVE NEGATIVE Final    Comment: (NOTE) SARS-CoV-2 target nucleic acids are NOT DETECTED.  The SARS-CoV-2 RNA is generally detectable in upper and lower respiratory specimens during the acute phase of infection. The lowest concentration of SARS-CoV-2 viral copies this assay can detect is 250 copies / mL. A negative result does not preclude SARS-CoV-2 infection and should not be used as the sole basis for treatment or other patient management decisions.  A negative result may occur with improper specimen collection / handling, submission of specimen other than nasopharyngeal swab, presence of viral mutation(s) within the areas targeted by this assay, and inadequate number of viral copies (<250 copies / mL). A negative result must be combined with clinical observations, patient history, and epidemiological information.  Fact Sheet for Patients:   BoilerBrush.com.cy  Fact Sheet for Healthcare Providers: https://pope.com/  This test is not yet approved or  cleared by the [place] FDA and has been authorized for detection and/or diagnosis of SARS-CoV-2 by FDA under an Emergency Use Authorization (EUA).  This EUA will remain in effect (meaning this test can be used) for the duration of the COVID-19 declaration under Section 564(b)(1) of the Act, 21 U.S.C. section 360bbb-3(b)(1), unless the authorization is terminated or revoked sooner.  Performed at [facility], [address] [place], [place] [address]     Studies/Results: CT ABDOMEN PELVIS WO CONTRAST  Result Date: 01/04/2020 CLINICAL DATA:  Abdominal pain. EXAM: CT ABDOMEN AND PELVIS WITHOUT CONTRAST TECHNIQUE: Multidetector CT imaging of the abdomen and pelvis was performed following the standard protocol without IV contrast. Study is somewhat degraded by patient motion. COMPARISON:   04/08/2011 FINDINGS: Lower chest: No acute abnormality. Hepatobiliary: No focal liver abnormality is seen. No gallstones, gallbladder wall thickening, or biliary dilatation. Pancreas: Unremarkable. No pancreatic ductal dilatation or surrounding inflammatory changes. Spleen: Normal in size without focal abnormality. Adrenals/Urinary Tract: No adrenal masses. Left kidney is swollen with mild hydronephrosis, perinephric stranding and several small nonobstructing intrarenal stones. Proximal ureter is dilated to the level of a 9 mm proximal ureteral stone. Ureter below this is normal in course and in caliber. Right kidney has been surgically removed. Mildly distended bladder. Multiple dependent bladder stones. No wall thickening or masses. Stomach/Bowel: Normal stomach. Small bowel and colon are normal in caliber. No wall thickening. No inflammation. No evidence of appendicitis. Vascular/Lymphatic: Scattered common iliac artery atherosclerotic calcifications. No aortic atherosclerotic calcifications or aneurysm. No enlarged lymph nodes. Reproductive: Enlarged prostate, 5.7 cm in greatest transverse dimension. Other: No abdominal wall hernia.  No ascites. Musculoskeletal: No fracture or acute finding. No osteoblastic or osteolytic lesions. IMPRESSION: 1. 9 mm stone in the proximal left ureter causes moderate left hydronephrosis with left renal edema and perinephric stranding.  2. No other acute abnormality within the abdomen or pelvis. 3. Multiple small nonobstructing stones in the left kidney. 4. Small dependent bladder stones. No bladder wall thickening or masses. 5. Status post right nephrectomy. Electronically Signed   By: [name] M.D.   On: 01/04/2020 21:04   DG C-Arm 1-60 Min-No Report  Result Date: 01/05/2020 Fluoroscopy was utilized by the requesting physician.  No radiographic interpretation.    Assessment: Left ureteral calculus and a solitary kidney Ureteral obstruction secondary to calculus Acute  renal insufficiency  Procedure(s): CYSTOSCOPY WITH RETROGRADE PYELOGRAM/URETERAL STENT PLACEMENT, Day of Surgery  doing well.  Plan: Continue Foley catheter for now until renal function improves adequately and urine output gets closer to baseline.  He will need to have definitive management of his stone once he clinically improves on an outpatient basis.   Modena Slater, MD Urology 01/05/2020, 5:42 PM

## 2020-01-06 LAB — CBC WITH DIFFERENTIAL/PLATELET
Abs Immature Granulocytes: 0.07 10*3/uL (ref 0.00–0.07)
Basophils Absolute: 0 10*3/uL (ref 0.0–0.1)
Basophils Relative: 0 %
Eosinophils Absolute: 0 10*3/uL (ref 0.0–0.5)
Eosinophils Relative: 0 %
HCT: 44.7 % (ref 39.0–52.0)
Hemoglobin: 15.2 g/dL (ref 13.0–17.0)
Immature Granulocytes: 0 %
Lymphocytes Relative: 3 %
Lymphs Abs: 0.4 10*3/uL — ABNORMAL LOW (ref 0.7–4.0)
MCH: 33.5 pg (ref 26.0–34.0)
MCHC: 34 g/dL (ref 30.0–36.0)
MCV: 98.5 fL (ref 80.0–100.0)
Monocytes Absolute: 1.4 10*3/uL — ABNORMAL HIGH (ref 0.1–1.0)
Monocytes Relative: 8 %
Neutro Abs: 14.8 10*3/uL — ABNORMAL HIGH (ref 1.7–7.7)
Neutrophils Relative %: 89 %
Platelets: 240 10*3/uL (ref 150–400)
RBC: 4.54 MIL/uL (ref 4.22–5.81)
RDW: 12.3 % (ref 11.5–15.5)
WBC: 16.7 10*3/uL — ABNORMAL HIGH (ref 4.0–10.5)
nRBC: 0 % (ref 0.0–0.2)

## 2020-01-06 LAB — BASIC METABOLIC PANEL
Anion gap: 12 (ref 5–15)
BUN: 73 mg/dL — ABNORMAL HIGH (ref 8–23)
CO2: 20 mmol/L — ABNORMAL LOW (ref 22–32)
Calcium: 9.1 mg/dL (ref 8.9–10.3)
Chloride: 121 mmol/L — ABNORMAL HIGH (ref 98–111)
Creatinine, Ser: 2.97 mg/dL — ABNORMAL HIGH (ref 0.61–1.24)
GFR calc Af Amer: 24 mL/min — ABNORMAL LOW (ref >60)
GFR calc non Af Amer: 21 mL/min — ABNORMAL LOW (ref >60)
Glucose, Bld: 137 mg/dL — ABNORMAL HIGH (ref 70–99)
Potassium: 4.7 mmol/L (ref 3.5–5.1)
Sodium: 153 mmol/L — ABNORMAL HIGH (ref 135–145)

## 2020-01-06 LAB — MAGNESIUM: Magnesium: 2.6 mg/dL — ABNORMAL HIGH (ref 1.7–2.4)

## 2020-01-06 LAB — URINE CULTURE: Culture: NO GROWTH

## 2020-01-06 LAB — GLUCOSE, CAPILLARY
Glucose-Capillary: 132 mg/dL — ABNORMAL HIGH (ref 70–99)
Glucose-Capillary: 149 mg/dL — ABNORMAL HIGH (ref 70–99)
Glucose-Capillary: 136 mg/dL — ABNORMAL HIGH (ref 70–99)

## 2020-01-06 NOTE — Evaluation (Signed)
Physical Therapy Evaluation Patient Details Name: Gerald Diaz MRN: [id number] DOB: [birth year] Today's Date: 01/06/2020   History of Present Illness  64 y.o. male with history of advanced dementia lives at home with 24-hour care was brought to the ER after patient had intractable nausea vomiting for the last 4 days. s/p CYSTOSCOPY WITH RETROGRADE PYELOGRAM/URETERAL STENT PLACEMENT  Clinical Impression  Pt admitted with above diagnosis. * Pt amb ~ 140' with min-guard assist. Pt has 24 hour assist at home d/t dementia. Recommend pt amb staff and or his caregiver. Doubt pt will need f/u PT post acute as long as he continues to progress. SNF stay would likely make pt more confused and worsen his cognition. PT will follow in acute setting as well.  Pt currently with functional limitations due to the deficits listed below (see PT Problem List). Pt will benefit from skilled PT to increase their independence and safety with mobility to allow discharge to the venue listed below.       Follow Up Recommendations No PT follow up. Continue 24hour assist     Equipment Recommendations  Other (comment)    Recommendations for Other Services       Precautions / Restrictions Precautions Precautions: Fall Restrictions Weight Bearing Restrictions: No      Mobility  Bed Mobility Overal bed mobility: Needs Assistance Bed Mobility: Supine to Sit;Sit to Supine     Supine to sit: Supervision Sit to supine: Supervision   General bed mobility comments: verbal cues for task initiation/overall safety  Transfers Overall transfer level: Needs assistance   Transfers: Sit to/from Stand Sit to Stand: Supervision;Min guard         General transfer comment: for safety, cues to intiate task  Ambulation/Gait Ambulation/Gait assistance: Min guard Gait Distance (Feet): 140 Feet Assistive device: None       General Gait Details: unsteady gait initially, pt appeared to be experiencing slight LOB at  times, however he was exhibiting alerted gait pattern intermittently and reaching down fidgeting with catheter tube. (caregiver states pt has been complaining about catheter discomfort)  Stairs            Wheelchair Mobility    Modified Rankin (Stroke Patients Only)       Balance Overall balance assessment: Needs assistance Sitting-balance support: No upper extremity supported;Feet supported Sitting balance-Leahy Scale: Good       Standing balance-Leahy Scale: Fair Standing balance comment: fair to good, unsteady with gait however recovers with delayed reactions. able to reach ~ 8" outside BOS with close supervison                             Pertinent Vitals/Pain Pain Assessment: No/denies pain    Home Living Family/patient expects to be discharged to:: Private residence   Available Help at Discharge: Available 24 hours/day;Personal care attendant           Home Equipment: None Additional Comments: per chart and caregiver at bedside pt has 24 hour care at home d/t dementia.    Prior Function Level of Independence: Needs assistance   Gait / Transfers Assistance Needed: has 24 hour care, distant supervision  ADL's / Homemaking Assistance Needed: supervision  Comments: requires     Hand Dominance        Extremity/Trunk Assessment   Upper Extremity Assessment Upper Extremity Assessment: Overall WFL for tasks assessed;Defer to OT evaluation    Lower Extremity Assessment Lower Extremity Assessment: Overall [facility] for  tasks assessed       Communication   Communication: No difficulties  Cognition Arousal/Alertness: Awake/alert Behavior During Therapy: Restless (fidgeting with catheter, and tele leads) Overall Cognitive Status: History of cognitive impairments - at baseline                                 General Comments: baseline pleasantly confused      General Comments      Exercises     Assessment/Plan    PT  Assessment Patient needs continued PT services  PT Problem List Decreased activity tolerance;Decreased balance;Decreased cognition       PT Treatment Interventions DME instruction;Therapeutic exercise;Gait training;Functional mobility training;Therapeutic activities;Patient/family education    PT Goals (Current goals can be found in the Care Plan section)  Acute Rehab PT Goals Patient Stated Goal: none stated, pt pleasantly confused PT Goal Formulation: Patient unable to participate in goal setting Time For Goal Achievement: 01/20/20 Potential to Achieve Goals: Good    Frequency Min 3X/week   Barriers to discharge        Co-evaluation               AM-PAC PT "6 Clicks" Mobility  Outcome Measure Help needed turning from your back to your side while in a flat bed without using bedrails?: None Help needed moving from lying on your back to sitting on the side of a flat bed without using bedrails?: A Little Help needed moving to and from a bed to a chair (including a wheelchair)?: A Little Help needed standing up from a chair using your arms (e.g., wheelchair or bedside chair)?: A Little Help needed to walk in hospital room?: A Little Help needed climbing 3-5 steps with a railing? : A Little 6 Click Score: 19    End of Session Equipment Utilized During Treatment: Gait belt Activity Tolerance: Patient tolerated treatment well Patient left: with call bell/phone within reach;in bed;with bed alarm set;with family/visitor present Nurse Communication: Mobility status PT Visit Diagnosis: Unsteadiness on feet (R26.81)    Time:  -      Charges:              Delice Bison, PT  Acute Rehab Dept ([facility]) [phone] Pager [phone]  01/06/2020   [facility] 01/06/2020, 1:30 PM

## 2020-01-06 NOTE — Progress Notes (Signed)
PROGRESS NOTE    [name]  TJQ:[id number] DOB: March 17, 1956 DOA: 01/04/2020 PCP: [name], MD   Brief Narrative:  Gerald Diaz is a 64 y.o. male with history of advanced dementia lives at home with 24-hour care was brought to the ER after patient had intractable nausea vomiting for the last 4 days.  Patient was not able to provide much history due to advanced dementia.  Most of the history was obtained from the legal guardian.  In the ER patient appeared hemodynamically stable Covid test was negative.  Labs showed creatinine of 15.3 potassium 5.3 with BUN of 165 WBC 18.6 CT scan of the abdomen pelvis showed 9 mm stone in proximal left ureter causing moderate left hydronephrosis and left renal edema and perinephric stranding.  Note that patient has only 1 functioning kidney.  Dr. Alvester Morin of urology was consulted and patient underwent cystoscopy with retrograde pyelogram/ureteral stent placement on 01/05/2020.  Admit under hospitalist service.  Was then also thought to be having pyelonephritis based on the UA and was started on Rocephin.  Assessment & Plan:   Principal Problem:   ARF (acute renal failure) (HCC) Active Problems:   Left ureteral stone   Dementia (HCC)  Acute renal failure secondary to obstructive uropathy/obstructing left ureteral stone/left hydronephrosis/?  Left pyelonephritis/hyperkalemia: For some reason, UA was not collected at the time of admission.  I requested RN to collect 1.  Now this shows multiple bacteria's, leukoesterase.  CT scan showed left sided perirenal stranding consistent with possible pyelonephritis.  UA consistent with UTI.  He has remained afebrile.  His creatinine has improved significantly all the way from over 13 to over 2 today.  Has good urine output.  Continue Rocephin, follow urine and blood culture.  Further management per urology.  Acute metabolic encephalopathy in a patient with known dementia: Patient is alert but pleasantly confused.  His  caregiver was present at the bedside yesterday and according to them, patient usually is alert and oriented x4 despite of having his dementia.  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  He has a guardian.  According to them, he has been having altered mental status at least 24 to 48 hours prior to presenting to the ED on 01-04-20.  However, I was given contrary information by a different caregiver was at the bedside today.  This caregiver claims that he takes care of the patient approximately 5 days a week and he has been seeing this patient for last 3 months.  According to him, patient remains pleasantly confused which he is now and according to him, this is patient's baseline.  He thinks that patient might benefit from going to SNF.  Per his request, PT OT as well as TOC has been consulted.  DVT prophylaxis: SCDs Start: 01/05/20 0116   Code Status: Full Code  Family Communication: His regular caregiver present at bedside.  Plan of care discussed with patient in length with her and she verbalized understanding and agreed with it.  Status is: Inpatient  Remains inpatient appropriate because:Inpatient level of care appropriate due to severity of illness   Dispo: The patient is from: Home              Anticipated d/c is to: Home              Anticipated d/c date is: 1-2 days              Patient currently is not  medically stable to d/c.        Estimated body mass index is 22.75 kg/m as calculated from the following:   Height as of 06/17/16: 6' (1.829 m).   Weight as of this encounter: 76.1 kg.      Nutritional status:               Consultants:   Urology  Procedures:   Cystoscopy with left ureteral stent placement  Antimicrobials:  Anti-infectives (From admission, onward)   Start     Dose/Rate Route Frequency Ordered Stop   01/05/20 1600  cefTRIAXone (ROCEPHIN) 1 g in sodium chloride 0.9 % 100 mL IVPB        1 g 200 mL/hr over 30 Minutes Intravenous Every  24 hours 01/05/20 1323     01/05/20 0337  ceFAZolin (ANCEF) 2-4 GM/100ML-% IVPB       Note to Pharmacy: Mirian Mo   : cabinet override      01/05/20 0337 01/05/20 1544         Subjective: Patient seen and examined.  Caregiver at the bedside.  Patient once again remains pleasantly confused.  He has no complaints.  Objective: Vitals:   01/05/20 2050 01/06/20 0046 01/06/20 0539 01/06/20 0942  BP: 105/89 126/83 116/76 (!) 132/92  Pulse: 77 90 72 61  Resp: 18 16 16 16   Temp: 98.4 F (36.9 C) 98.8 F (37.1 C) 98.4 F (36.9 C) 97.7 F (36.5 C)  TempSrc: Oral Oral Oral Oral  SpO2: 96% 94% 95% 97%  Weight:        Intake/Output Summary (Last 24 hours) at 01/06/2020 1101 Last data filed at 01/06/2020 0942 Gross per 24 hour  Intake 240 ml  Output 5525 ml  Net -5285 ml   Filed Weights   01/05/20 1651  Weight: 76.1 kg    Examination:  General exam: Appears calm and comfortable  Respiratory system: Clear to auscultation. Respiratory effort normal. Cardiovascular system: S1 & S2 heard, RRR. No JVD, murmurs, rubs, gallops or clicks. No pedal edema. Gastrointestinal system: Abdomen is nondistended, soft and nontender. No organomegaly or masses felt. Normal bowel sounds heard. Central nervous system: Alert and oriented x0. No focal neurological deficits. Extremities: Symmetric 5 x 5 power. Skin: No rashes, lesions or ulcers.  Psychiatry: Judgement and insight appear poor. Mood & affect appropriate.   Data Reviewed: I have personally reviewed following labs and imaging studies  CBC: Recent Labs  Lab 01/04/20 1409 01/05/20 0555 01/06/20 0548  WBC 18.6* 15.1* 16.7*  NEUTROABS  --   --  14.8*  HGB 14.6 14.3 15.2  HCT 42.3 42.2 44.7  MCV 98.8 100.0 98.5  PLT 201 193 240   Basic Metabolic Panel: Recent Labs  Lab 01/04/20 1409 01/05/20 0555 01/06/20 0548  NA 145 141 153*  K 5.3* 4.8 4.7  CL 104 107 121*  CO2 18* 15* 20*  GLUCOSE 130* 134* 137*  BUN 165* 154* 73*   CREATININE 15.31* 13.45* 2.97*  CALCIUM 8.9 8.1* 9.1  MG  --   --  2.6*   GFR: CrCl cannot be calculated (Unknown ideal weight.). Liver Function Tests: Recent Labs  Lab 01/04/20 1409  AST 12*  ALT 19  ALKPHOS 76  BILITOT 1.2  PROT 7.3  ALBUMIN 4.0   No results for input(s): LIPASE, AMYLASE in the last 168 hours. No results for input(s): AMMONIA in the last 168 hours. Coagulation Profile: No results for input(s): INR, PROTIME in the last 168 hours. Cardiac Enzymes:  No results for input(s): CKTOTAL, CKMB, CKMBINDEX, TROPONINI in the last 168 hours. BNP (last 3 results) No results for input(s): PROBNP in the last 8760 hours. HbA1C: No results for input(s): HGBA1C in the last 72 hours. CBG: Recent Labs  Lab 01/05/20 0554 01/05/20 1235 01/05/20 1733 01/05/20 2250 01/06/20 0542  GLUCAP 125* 144* 153* 194* 132*   Lipid Profile: No results for input(s): CHOL, HDL, LDLCALC, TRIG, CHOLHDL, LDLDIRECT in the last 72 hours. Thyroid Function Tests: No results for input(s): TSH, T4TOTAL, FREET4, T3FREE, THYROIDAB in the last 72 hours. Anemia Panel: No results for input(s): VITAMINB12, FOLATE, FERRITIN, TIBC, IRON, RETICCTPCT in the last 72 hours. Sepsis Labs: No results for input(s): PROCALCITON, LATICACIDVEN in the last 168 hours.  Recent Results (from the past 240 hour(s))  SARS Coronavirus 2 by RT PCR (hospital order, performed in [facility] hospital lab) Nasopharyngeal Nasopharyngeal Swab     Status: None   Collection Time: 01/04/20 10:34 PM   Specimen: Nasopharyngeal Swab  Result Value Ref Range Status   SARS Coronavirus 2 NEGATIVE NEGATIVE Final    Comment: (NOTE) SARS-CoV-2 target nucleic acids are NOT DETECTED.  The SARS-CoV-2 RNA is generally detectable in upper and lower respiratory specimens during the acute phase of infection. The lowest concentration of SARS-CoV-2 viral copies this assay can detect is 250 copies / mL. A negative result does not preclude  SARS-CoV-2 infection and should not be used as the sole basis for treatment or other patient management decisions.  A negative result may occur with improper specimen collection / handling, submission of specimen other than nasopharyngeal swab, presence of viral mutation(s) within the areas targeted by this assay, and inadequate number of viral copies (<250 copies / mL). A negative result must be combined with clinical observations, patient history, and epidemiological information.  Fact Sheet for Patients:   BoilerBrush.com.cyhttps://www.fda.gov/media/136312/download  Fact Sheet for Healthcare Providers: https://pope.com/https://www.fda.gov/media/136313/download  This test is not yet approved or  cleared by the [place] FDA and has been authorized for detection and/or diagnosis of SARS-CoV-2 by FDA under an Emergency Use Authorization (EUA).  This EUA will remain in effect (meaning this test can be used) for the duration of the COVID-19 declaration under Section 564(b)(1) of the Act, 21 U.S.C. section 360bbb-3(b)(1), unless the authorization is terminated or revoked sooner.  Performed at [facility], [address] [place], [place] [address]   Culture, blood (routine x 2)     Status: None (Preliminary result)   Collection Time: 01/05/20  1:46 PM   Specimen: BLOOD LEFT HAND  Result Value Ref Range Status   Specimen Description   Final    BLOOD LEFT HAND Performed at [facility], [address] [place], [place] [address]    Special Requests   Final    BOTTLES DRAWN AEROBIC AND ANAEROBIC Blood Culture adequate volume Performed at [facility], [address] [place], [place] [address]    Culture PENDING  Incomplete   Report Status PENDING  Incomplete      Radiology Studies: CT ABDOMEN PELVIS WO CONTRAST  Result Date: 01/04/2020 CLINICAL DATA:  Abdominal pain. EXAM: CT ABDOMEN AND PELVIS WITHOUT CONTRAST TECHNIQUE: Multidetector CT imaging of the abdomen  and pelvis was performed following the standard protocol without IV contrast. Study is somewhat degraded by patient motion. COMPARISON:  04/08/2011 FINDINGS: Lower chest: No acute abnormality. Hepatobiliary: No focal liver abnormality is seen. No gallstones, gallbladder wall thickening, or biliary dilatation. Pancreas: Unremarkable. No pancreatic  ductal dilatation or surrounding inflammatory changes. Spleen: Normal in size without focal abnormality. Adrenals/Urinary Tract: No adrenal masses. Left kidney is swollen with mild hydronephrosis, perinephric stranding and several small nonobstructing intrarenal stones. Proximal ureter is dilated to the level of a 9 mm proximal ureteral stone. Ureter below this is normal in course and in caliber. Right kidney has been surgically removed. Mildly distended bladder. Multiple dependent bladder stones. No wall thickening or masses. Stomach/Bowel: Normal stomach. Small bowel and colon are normal in caliber. No wall thickening. No inflammation. No evidence of appendicitis. Vascular/Lymphatic: Scattered common iliac artery atherosclerotic calcifications. No aortic atherosclerotic calcifications or aneurysm. No enlarged lymph nodes. Reproductive: Enlarged prostate, 5.7 cm in greatest transverse dimension. Other: No abdominal wall hernia.  No ascites. Musculoskeletal: No fracture or acute finding. No osteoblastic or osteolytic lesions. IMPRESSION: 1. 9 mm stone in the proximal left ureter causes moderate left hydronephrosis with left renal edema and perinephric stranding. 2. No other acute abnormality within the abdomen or pelvis. 3. Multiple small nonobstructing stones in the left kidney. 4. Small dependent bladder stones. No bladder wall thickening or masses. 5. Status post right nephrectomy. Electronically Signed   By: [name] M.D.   On: 01/04/2020 21:04   DG C-Arm 1-60 Min-No Report  Result Date: 01/05/2020 Fluoroscopy was utilized by the requesting physician.  No  radiographic interpretation.    Scheduled Meds: . Chlorhexidine Gluconate Cloth  6 each Topical Daily   Continuous Infusions: . cefTRIAXone (ROCEPHIN)  IV 1 g (01/05/20 2024)     LOS: 2 days   Time spent: 30 minutes   Hughie Closs, MD [facility]  01/06/2020, 11:01 AM   To contact the attending provider between 7A-7P or the covering provider during after hours 7P-7A, please log into the web site [email or web address].

## 2020-01-06 NOTE — Progress Notes (Signed)
OT Cancellation Note  Patient Details Name: Gerald Diaz MRN: [id number] DOB: [birth year]   Cancelled Treatment:    Reason Eval/Treat Not Completed: OT screened, no needs identified, will sign off. Discussed with PT that patient has 24/7 caregiver assist at home and was min G during PT eval. No acute OT needs identified at this time, please re-consult if new needs arise.   Marlyce Huge OT OT pager: [phone]   Carmelia Roller 01/06/2020, 1:44 PM

## 2020-01-07 LAB — CBC WITH DIFFERENTIAL/PLATELET
Abs Immature Granulocytes: 0.09 10*3/uL — ABNORMAL HIGH (ref 0.00–0.07)
Basophils Absolute: 0 10*3/uL (ref 0.0–0.1)
Basophils Relative: 0 %
Eosinophils Absolute: 0 10*3/uL (ref 0.0–0.5)
Eosinophils Relative: 0 %
HCT: 47 % (ref 39.0–52.0)
Hemoglobin: 15.7 g/dL (ref 13.0–17.0)
Immature Granulocytes: 1 %
Lymphocytes Relative: 6 %
Lymphs Abs: 0.9 10*3/uL (ref 0.7–4.0)
MCH: 33.8 pg (ref 26.0–34.0)
MCHC: 33.4 g/dL (ref 30.0–36.0)
MCV: 101.1 fL — ABNORMAL HIGH (ref 80.0–100.0)
Monocytes Absolute: 1 10*3/uL (ref 0.1–1.0)
Monocytes Relative: 7 %
Neutro Abs: 12.2 10*3/uL — ABNORMAL HIGH (ref 1.7–7.7)
Neutrophils Relative %: 86 %
Platelets: 242 10*3/uL (ref 150–400)
RBC: 4.65 MIL/uL (ref 4.22–5.81)
RDW: 12.5 % (ref 11.5–15.5)
WBC: 14.3 10*3/uL — ABNORMAL HIGH (ref 4.0–10.5)
nRBC: 0 % (ref 0.0–0.2)

## 2020-01-07 LAB — BASIC METABOLIC PANEL
Anion gap: 12 (ref 5–15)
BUN: 54 mg/dL — ABNORMAL HIGH (ref 8–23)
CO2: 21 mmol/L — ABNORMAL LOW (ref 22–32)
Calcium: 9.1 mg/dL (ref 8.9–10.3)
Chloride: 119 mmol/L — ABNORMAL HIGH (ref 98–111)
Creatinine, Ser: 1.75 mg/dL — ABNORMAL HIGH (ref 0.61–1.24)
GFR calc Af Amer: 46 mL/min — ABNORMAL LOW (ref >60)
GFR calc non Af Amer: 40 mL/min — ABNORMAL LOW (ref >60)
Glucose, Bld: 126 mg/dL — ABNORMAL HIGH (ref 70–99)
Potassium: 4.5 mmol/L (ref 3.5–5.1)
Sodium: 152 mmol/L — ABNORMAL HIGH (ref 135–145)

## 2020-01-07 LAB — GLUCOSE, CAPILLARY
Glucose-Capillary: 112 mg/dL — ABNORMAL HIGH (ref 70–99)
Glucose-Capillary: 116 mg/dL — ABNORMAL HIGH (ref 70–99)
Glucose-Capillary: 118 mg/dL — ABNORMAL HIGH (ref 70–99)
Glucose-Capillary: 155 mg/dL — ABNORMAL HIGH (ref 70–99)
Glucose-Capillary: 93 mg/dL (ref 70–99)

## 2020-01-07 MED ORDER — LEVOFLOXACIN 750 MG PO TABS: 750.0000 mg | ORAL_TABLET | Freq: Every day | ORAL | 0 refills | Status: AC

## 2020-01-07 NOTE — Progress Notes (Signed)
Patient ambulating to the bathroom with standby assist, tolerated well. Urine has turned from frank red blood to now tea color. Sitter is in the room.

## 2020-01-07 NOTE — Progress Notes (Signed)
Assumed patient care at 0400, agree with previous RN assessment.

## 2020-01-07 NOTE — Progress Notes (Signed)
PROGRESS NOTE    Gerald Diaz  UXL:[id number] DOB: 12-15-1955 DOA: 01/04/2020 PCP: Herschel SenegalHaber, Michele, MD   Brief Narrative:  Gerald Diaz is a 64 y.o. male with history of advanced dementia lives at home with 24-hour care was brought to the ER after patient had intractable nausea vomiting for the last 4 days.  Patient was not able to provide much history due to advanced dementia.  Most of the history was obtained from the legal guardian.  In the ER patient appeared hemodynamically stable Covid test was negative.  Labs showed creatinine of 15.3 potassium 5.3 with BUN of 165 WBC 18.6 CT scan of the abdomen pelvis showed 9 mm stone in proximal left ureter causing moderate left hydronephrosis and left renal edema and perinephric stranding.  Note that patient has only 1 functioning kidney.  Dr. Alvester MorinBell of urology was consulted and patient underwent cystoscopy with retrograde pyelogram/ureteral stent placement on 01/05/2020.  Admit under hospitalist service.  Was then also thought to be having pyelonephritis based on the UA and was started on Rocephin.  Assessment & Plan:   Principal Problem:   ARF (acute renal failure) (HCC) Active Problems:   Left ureteral stone   Dementia (HCC)  Acute renal failure secondary to obstructive uropathy/obstructing left ureteral stone/left hydronephrosis/?  Left pyelonephritis/hyperkalemia: UA  shows multiple bacteria's, leukoesterase.  CT scan showed left sided perirenal stranding consistent with possible pyelonephritis.  UA consistent with UTI.  He has remained afebrile.  His creatinine has improved significantly all the way from over 13 to over 1.7 today.  Has good urine output.  Discussed with Dr. Alvester MorinBell of urology.  He cleared the patient for discharge home as well.  Removed Foley catheter and he passed voiding trial however initially he had 25 cc out which was frank blood and then few hours later he had 50 cc out which was still bloody but better than before.  Likely  urethral trauma while removing Foley catheter.  Will need to keep in the hospital for observation to make sure his urine clears up.  We will continue Rocephin, follow urine and blood culture which are negative so far.  Hope to discharge back home tomorrow.  Acute metabolic encephalopathy in a patient with known dementia: Patient is alert but pleasantly confused.  Per caregiver at the bedside, this is his baseline.  DVT prophylaxis: SCDs Start: 01/05/20 0116   Code Status: Full Code  Family Communication: His regular caregiver present at bedside.  I called patient's guardian and discussed plan of care.  She informed me that none of the patient's caregiver is allowed or license to deal with any medical device such as Foley catheter.  If patient were to go home with Foley catheter, he will need to go to SNF.  [facility]  Status is: Inpatient  Remains inpatient appropriate because:Inpatient level of care appropriate due to severity of illness   Dispo: The patient is from: Home              Anticipated d/c is to: Home              Anticipated d/c date is: 1-2 days              Patient currently is not medically stable to d/c.        Estimated body mass index is 22.75 kg/m as calculated from the following:   Height as of 06/17/16: 6' (1.829 m).   Weight as  of this encounter: 76.1 kg.      Nutritional status:               Consultants:   Urology  Procedures:   Cystoscopy with left ureteral stent placement  Antimicrobials:  Anti-infectives (From admission, onward)   Start     Dose/Rate Route Frequency Ordered Stop   01/08/20 0000  levofloxacin (LEVAQUIN) 750 MG tablet        750 mg Oral Daily 01/07/20 1008 01/12/20 2359   01/05/20 1600  cefTRIAXone (ROCEPHIN) 1 g in sodium chloride 0.9 % 100 mL IVPB        1 g 200 mL/hr over 30 Minutes Intravenous Every 24 hours 01/05/20 1323     01/05/20 0337  ceFAZolin (ANCEF) 2-4 GM/100ML-% IVPB        Note to Pharmacy: Mirian Mo   : cabinet override      01/05/20 0337 01/05/20 1544         Subjective: Seen and examined.  Caregiver at the bedside.  Patient alert and pleasantly confused at baseline and as usual.  No complaints.  Objective: Vitals:   01/07/20 0113 01/07/20 0256 01/07/20 0812 01/07/20 1351  BP: 132/79 (!) 137/93 (!) 130/93 119/88  Pulse: 71 (!) 101 (!) 103 91  Resp: 16 16 17 17   Temp:  98.1 F (36.7 C) 99.4 F (37.4 C) (!) 97.3 F (36.3 C)  TempSrc:  Oral Oral Oral  SpO2: 99% 97% 95% 95%  Weight:        Intake/Output Summary (Last 24 hours) at 01/07/2020 1547 Last data filed at 01/07/2020 1532 Gross per 24 hour  Intake 220 ml  Output 2595 ml  Net -2375 ml   Filed Weights   01/05/20 1651  Weight: 76.1 kg    Examination:  General exam: Appears calm and comfortable  Respiratory system: Clear to auscultation. Respiratory effort normal. Cardiovascular system: S1 & S2 heard, RRR. No JVD, murmurs, rubs, gallops or clicks. No pedal edema. Gastrointestinal system: Abdomen is nondistended, soft and nontender. No organomegaly or masses felt. Normal bowel sounds heard. Central nervous system: Alert and oriented x0. No focal neurological deficits. Extremities: Symmetric 5 x 5 power. Skin: No rashes, lesions or ulcers.  Psychiatry: Judgement and insight appear poor. Mood & affect appropriate.    Data Reviewed: I have personally reviewed following labs and imaging studies  CBC: Recent Labs  Lab 01/04/20 1409 01/05/20 0555 01/06/20 0548 01/07/20 0526  WBC 18.6* 15.1* 16.7* 14.3*  NEUTROABS  --   --  14.8* 12.2*  HGB 14.6 14.3 15.2 15.7  HCT 42.3 42.2 44.7 47.0  MCV 98.8 100.0 98.5 101.1*  PLT 201 193 240 242   Basic Metabolic Panel: Recent Labs  Lab 01/04/20 1409 01/05/20 0555 01/06/20 0548 01/07/20 0526  NA 145 141 153* 152*  K 5.3* 4.8 4.7 4.5  CL 104 107 121* 119*  CO2 18* 15* 20* 21*  GLUCOSE 130* 134* 137* 126*  BUN 165* 154* 73*  54*  CREATININE 15.31* 13.45* 2.97* 1.75*  CALCIUM 8.9 8.1* 9.1 9.1  MG  --   --  2.6*  --    GFR: CrCl cannot be calculated (Unknown ideal weight.). Liver Function Tests: Recent Labs  Lab 01/04/20 1409  AST 12*  ALT 19  ALKPHOS 76  BILITOT 1.2  PROT 7.3  ALBUMIN 4.0   No results for input(s): LIPASE, AMYLASE in the last 168 hours. No results for input(s): AMMONIA in the last 168 hours. Coagulation Profile:  No results for input(s): INR, PROTIME in the last 168 hours. Cardiac Enzymes: No results for input(s): CKTOTAL, CKMB, CKMBINDEX, TROPONINI in the last 168 hours. BNP (last 3 results) No results for input(s): PROBNP in the last 8760 hours. HbA1C: No results for input(s): HGBA1C in the last 72 hours. CBG: Recent Labs  Lab 01/06/20 1202 01/06/20 1816 01/07/20 0111 01/07/20 0645 01/07/20 1112  GLUCAP 149* 136* 93 112* 155*   Lipid Profile: No results for input(s): CHOL, HDL, LDLCALC, TRIG, CHOLHDL, LDLDIRECT in the last 72 hours. Thyroid Function Tests: No results for input(s): TSH, T4TOTAL, FREET4, T3FREE, THYROIDAB in the last 72 hours. Anemia Panel: No results for input(s): VITAMINB12, FOLATE, FERRITIN, TIBC, IRON, RETICCTPCT in the last 72 hours. Sepsis Labs: No results for input(s): PROCALCITON, LATICACIDVEN in the last 168 hours.  Recent Results (from the past 240 hour(s))  SARS Coronavirus 2 by RT PCR (hospital order, performed in [facility] hospital lab) Nasopharyngeal Nasopharyngeal Swab     Status: None   Collection Time: 01/04/20 10:34 PM   Specimen: Nasopharyngeal Swab  Result Value Ref Range Status   SARS Coronavirus 2 NEGATIVE NEGATIVE Final    Comment: (NOTE) SARS-CoV-2 target nucleic acids are NOT DETECTED.  The SARS-CoV-2 RNA is generally detectable in upper and lower respiratory specimens during the acute phase of infection. The lowest concentration of SARS-CoV-2 viral copies this assay can detect is 250 copies / mL. A negative result does  not preclude SARS-CoV-2 infection and should not be used as the sole basis for treatment or other patient management decisions.  A negative result may occur with improper specimen collection / handling, submission of specimen other than nasopharyngeal swab, presence of viral mutation(s) within the areas targeted by this assay, and inadequate number of viral copies (<250 copies / mL). A negative result must be combined with clinical observations, patient history, and epidemiological information.  Fact Sheet for Patients:   BoilerBrush.com.cy  Fact Sheet for Healthcare Providers: https://pope.com/  This test is not yet approved or  cleared by the [place] FDA and has been authorized for detection and/or diagnosis of SARS-CoV-2 by FDA under an Emergency Use Authorization (EUA).  This EUA will remain in effect (meaning this test can be used) for the duration of the COVID-19 declaration under Section 564(b)(1) of the Act, 21 U.S.C. section 360bbb-3(b)(1), unless the authorization is terminated or revoked sooner.  Performed at [facility], [address], [place], [place] [address]   Culture, blood (routine x 2)     Status: None (Preliminary result)   Collection Time: 01/05/20  1:41 PM   Specimen: BLOOD  Result Value Ref Range Status   Specimen Description   Final    BLOOD LEFT ANTECUBITAL Performed at [facility], [address] [place], [place] [address]    Special Requests   Final    BOTTLES DRAWN AEROBIC AND ANAEROBIC Blood Culture adequate volume Performed at [facility], [address] [place], [place] [address]    Culture   Final    NO GROWTH 2 DAYS Performed at [facility], [address], [place], [place] [address]    Report Status PENDING  Incomplete  Culture, blood (routine x 2)     Status: None (Preliminary result)   Collection Time: 01/05/20  1:46  PM   Specimen: BLOOD LEFT HAND  Result Value Ref Range Status   Specimen Description   Final    BLOOD LEFT HAND Performed at  [facility], [address] [place], [place] [address]    Special Requests   Final    BOTTLES DRAWN AEROBIC AND ANAEROBIC Blood Culture adequate volume Performed at [facility], [address], [place], [place] [address]    Culture   Final    NO GROWTH 2 DAYS Performed at [facility], [address], [place], [place] [address]    Report Status PENDING  Incomplete  Culture, Urine     Status: None   Collection Time: 01/05/20  2:42 PM   Specimen: Urine, Clean Catch  Result Value Ref Range Status   Specimen Description   Final    URINE, CLEAN CATCH Performed at [facility], [address] [place], [place] [address]    Special Requests   Final    NONE Performed at [facility], [address] [place], [place] [address]    Culture   Final    NO GROWTH Performed at [facility], [address], [place], [place] [address]    Report Status 01/06/2020 FINAL  Final      Radiology Studies: No results found.  Scheduled Meds: . Chlorhexidine Gluconate Cloth  6 each Topical Daily   Continuous Infusions: . cefTRIAXone (ROCEPHIN)  IV 1 g (01/07/20 1532)     LOS: 3 days   Time spent: 28 minutes   Hughie Closs, MD [facility]  01/07/2020, 3:47 PM   To contact the attending provider between 7A-7P or the covering provider during after hours 7P-7A, please log into the web site [email or web address].

## 2020-01-08 LAB — CBC WITH DIFFERENTIAL/PLATELET
Abs Immature Granulocytes: 0.09 10*3/uL — ABNORMAL HIGH (ref 0.00–0.07)
Basophils Absolute: 0 10*3/uL (ref 0.0–0.1)
Basophils Relative: 0 %
Eosinophils Absolute: 0.1 10*3/uL (ref 0.0–0.5)
Eosinophils Relative: 1 %
HCT: 47.2 % (ref 39.0–52.0)
Hemoglobin: 15.4 g/dL (ref 13.0–17.0)
Immature Granulocytes: 1 %
Lymphocytes Relative: 8 %
Lymphs Abs: 1 10*3/uL (ref 0.7–4.0)
MCH: 33.1 pg (ref 26.0–34.0)
MCHC: 32.6 g/dL (ref 30.0–36.0)
MCV: 101.5 fL — ABNORMAL HIGH (ref 80.0–100.0)
Monocytes Absolute: 0.9 10*3/uL (ref 0.1–1.0)
Monocytes Relative: 8 %
Neutro Abs: 10.4 10*3/uL — ABNORMAL HIGH (ref 1.7–7.7)
Neutrophils Relative %: 82 %
Platelets: 236 10*3/uL (ref 150–400)
RBC: 4.65 MIL/uL (ref 4.22–5.81)
RDW: 12.2 % (ref 11.5–15.5)
WBC: 12.5 10*3/uL — ABNORMAL HIGH (ref 4.0–10.5)
nRBC: 0 % (ref 0.0–0.2)

## 2020-01-08 LAB — MAGNESIUM: Magnesium: 2.5 mg/dL — ABNORMAL HIGH (ref 1.7–2.4)

## 2020-01-08 LAB — BASIC METABOLIC PANEL
Anion gap: 12 (ref 5–15)
BUN: 52 mg/dL — ABNORMAL HIGH (ref 8–23)
CO2: 23 mmol/L (ref 22–32)
Calcium: 8.9 mg/dL (ref 8.9–10.3)
Chloride: 112 mmol/L — ABNORMAL HIGH (ref 98–111)
Creatinine, Ser: 1.73 mg/dL — ABNORMAL HIGH (ref 0.61–1.24)
GFR calc Af Amer: 47 mL/min — ABNORMAL LOW (ref >60)
GFR calc non Af Amer: 40 mL/min — ABNORMAL LOW (ref >60)
Glucose, Bld: 124 mg/dL — ABNORMAL HIGH (ref 70–99)
Potassium: 4.3 mmol/L (ref 3.5–5.1)
Sodium: 147 mmol/L — ABNORMAL HIGH (ref 135–145)

## 2020-01-08 LAB — GLUCOSE, CAPILLARY: Glucose-Capillary: 106 mg/dL — ABNORMAL HIGH (ref 70–99)

## 2020-01-08 NOTE — Progress Notes (Signed)
This RN notified legal guardian, Orlene Plum., about pt discharging. Legal guardian confirmed the caregiver present in the room will transport the pt home and will pick up his medication at the pharmacy. The legal guardian requested to be faxed the AVS. This was completed by this RN. The pt and caregiver were explained and provided the discharge instructions. Pt IV removed. Pt dressed in personal clothing, as well as the dermotherapy pants, and was transported to the front entrance via the wheelchair where the caregiver was waiting.

## 2020-01-08 NOTE — Discharge Summary (Signed)
Physician Discharge Summary  Gerald Diaz JSE:[id number] DOB: 01-09-1956 DOA: 01/04/2020  PCP: [name], MD  Admit date: 01/04/2020 Discharge date: 01/08/2020  Admitted From: Home Disposition: Home  Recommendations for Outpatient Follow-up:  1. Follow up with PCP in 1-2 weeks 2. Follow-up with urology in 1 to 2 weeks 3. Please obtain BMP/CBC in one week 4. Please follow up with your PCP on the following pending results: Unresulted Labs (From admission, onward)          Start     Ordered   01/06/20 0500  Basic metabolic panel  Daily,   R      01/05/20 1329           Home Health: None Equipment/Devices: None  Discharge Condition: Stable CODE STATUS: Full code Diet recommendation: Regular  Subjective: Seen and examined.  Caretaker at the bedside.  Patient has no complaints.  Brief/Interim Summary: Gerald Diaz a 64 y.o.malewithhistory of advanced dementia lives at home with 24-hour care was brought to the ER after patient had intractable nausea vomiting for the last 4 days. Patient was not able to provide much history due to advanced dementia. Most of the history was obtained from the legal guardian.  In the ER patient appeared hemodynamically stable Covid test was negative. Labs showed creatinine of 15.3 potassium 5.3 with BUN of 165 WBC 18.6 CT scan of the abdomen pelvis showed 9 mm stone in proximal left ureter causing moderate left hydronephrosis and left renal edema and perinephric stranding. Note that patient has only 1 functioning kidney. Dr. Alvester Morin of urology was consulted and patient underwent cystoscopy with retrograde pyelogram/ureteral stent placement on 01/05/2020.  Admitted under hospitalist service.  Was then also thought to be having pyelonephritis based on the UA and was started on Rocephin.  Since the placement of ureteral stent, patient's creatinine improved significantly all the way from 15.3-1.7 yesterday and remained stable at 1.7 today.  Patient has not  had any other complication during this hospitalization.  Patient's Foley catheter was removed yesterday per urology recommendations.  After that, patient had voided but initially it was frank hematuria.  Hematuria cleared up and patient has had couple of more voidings overnight which have been clear.  Patient's hemoglobin has remained stable.  Due to his creatinine being stable with no change compared to yesterday, I discussed the case with Dr. Alvester Morin of urology who thinks that patient can be discharged with follow-up with urology.  Patient has remained alert but confused which is his baseline.  We are going to discharge patient today in a stable condition.  I have prescribed him few more days of oral Levaquin to complete course for pyelonephritis.  I have also discussed plan of discharge with his legal guardian over the phone.  She is in agreement as well.  Discharge Diagnoses:  Principal Problem:   ARF (acute renal failure) (HCC) Active Problems:   Left ureteral stone   Dementia [facility])    Discharge Instructions  Discharge Instructions    Discharge patient   Complete by: As directed    Discharge disposition: 01-Home or Self Care   Discharge patient date: 01/08/2020     Allergies as of 01/08/2020   No Known Allergies     Medication List    TAKE these medications   donepezil 10 MG tablet Commonly known as: ARICEPT Take 10 mg by mouth at bedtime.   levofloxacin 750 MG tablet Commonly known as: Levaquin Take 1 tablet (750 mg total) by mouth daily  for 4 days.   sertraline 25 MG tablet Commonly known as: ZOLOFT Take 25 mg by mouth daily.       Follow-up Information    [name], MD Follow up in 1 week(s).   Specialties: Internal Medicine, Geriatric Medicine Contact information: [address] [place] [address] [phone]        [name], MD Follow up in 2 week(s).   Specialty: Urology Contact information: [address] [place]  [address] [phone]              No Known Allergies  Consultations: Urology   Procedures/Studies: CT ABDOMEN PELVIS WO CONTRAST  Result Date: 01/04/2020 CLINICAL DATA:  Abdominal pain. EXAM: CT ABDOMEN AND PELVIS WITHOUT CONTRAST TECHNIQUE: Multidetector CT imaging of the abdomen and pelvis was performed following the standard protocol without IV contrast. Study is somewhat degraded by patient motion. COMPARISON:  04/08/2011 FINDINGS: Lower chest: No acute abnormality. Hepatobiliary: No focal liver abnormality is seen. No gallstones, gallbladder wall thickening, or biliary dilatation. Pancreas: Unremarkable. No pancreatic ductal dilatation or surrounding inflammatory changes. Spleen: Normal in size without focal abnormality. Adrenals/Urinary Tract: No adrenal masses. Left kidney is swollen with mild hydronephrosis, perinephric stranding and several small nonobstructing intrarenal stones. Proximal ureter is dilated to the level of a 9 mm proximal ureteral stone. Ureter below this is normal in course and in caliber. Right kidney has been surgically removed. Mildly distended bladder. Multiple dependent bladder stones. No wall thickening or masses. Stomach/Bowel: Normal stomach. Small bowel and colon are normal in caliber. No wall thickening. No inflammation. No evidence of appendicitis. Vascular/Lymphatic: Scattered common iliac artery atherosclerotic calcifications. No aortic atherosclerotic calcifications or aneurysm. No enlarged lymph nodes. Reproductive: Enlarged prostate, 5.7 cm in greatest transverse dimension. Other: No abdominal wall hernia.  No ascites. Musculoskeletal: No fracture or acute finding. No osteoblastic or osteolytic lesions. IMPRESSION: 1. 9 mm stone in the proximal left ureter causes moderate left hydronephrosis with left renal edema and perinephric stranding. 2. No other acute abnormality within the abdomen or pelvis. 3. Multiple small nonobstructing stones in the left  kidney. 4. Small dependent bladder stones. No bladder wall thickening or masses. 5. Status post right nephrectomy. Electronically Signed   By: [name] M.D.   On: 01/04/2020 21:04   DG C-Arm 1-60 Min-No Report  Result Date: 01/05/2020 Fluoroscopy was utilized by the requesting physician.  No radiographic interpretation.      Discharge Exam: Vitals:   01/07/20 2215 01/08/20 0538  BP: (!) 144/92 (!) 147/86  Pulse: 76 (!) 54  Resp: 17 17  Temp: (!) 97.5 F (36.4 C) (!) 97.5 F (36.4 C)  SpO2: 99% 96%   Vitals:   01/07/20 0812 01/07/20 1351 01/07/20 2215 01/08/20 0538  BP: (!) 130/93 119/88 (!) 144/92 (!) 147/86  Pulse: (!) 103 91 76 (!) 54  Resp: 17 17 17 17   Temp: 99.4 F (37.4 C) (!) 97.3 F (36.3 C) (!) 97.5 F (36.4 C) (!) 97.5 F (36.4 C)  TempSrc: Oral Oral Oral   SpO2: 95% 95% 99% 96%  Weight:        General: Pt is alert, awake, not in acute distress Cardiovascular: RRR, S1/S2 +, no rubs, no gallops Respiratory: CTA bilaterally, no wheezing, no rhonchi Abdominal: Soft, NT, ND, bowel sounds + Extremities: no edema, no cyanosis    The results of significant diagnostics from this hospitalization (including imaging, microbiology, ancillary and laboratory) are listed below for reference.  Microbiology: Recent Results (from the past 240 hour(s))  SARS Coronavirus 2 by RT PCR (hospital order, performed in [facility] lab) Nasopharyngeal Nasopharyngeal Swab     Status: None   Collection Time: 01/04/20 10:34 PM   Specimen: Nasopharyngeal Swab  Result Value Ref Range Status   SARS Coronavirus 2 NEGATIVE NEGATIVE Final    Comment: (NOTE) SARS-CoV-2 target nucleic acids are NOT DETECTED.  The SARS-CoV-2 RNA is generally detectable in upper and lower respiratory specimens during the acute phase of infection. The lowest concentration of SARS-CoV-2 viral copies this assay can detect is 250 copies / mL. A negative result does not preclude SARS-CoV-2  infection and should not be used as the sole basis for treatment or other patient management decisions.  A negative result may occur with improper specimen collection / handling, submission of specimen other than nasopharyngeal swab, presence of viral mutation(s) within the areas targeted by this assay, and inadequate number of viral copies (<250 copies / mL). A negative result must be combined with clinical observations, patient history, and epidemiological information.  Fact Sheet for Patients:   BoilerBrush.com.cyhttps://www.fda.gov/media/136312/download  Fact Sheet for Healthcare Providers: https://pope.com/https://www.fda.gov/media/136313/download  This test is not yet approved or  cleared by the [place] FDA and has been authorized for detection and/or diagnosis of SARS-CoV-2 by FDA under an Emergency Use Authorization (EUA).  This EUA will remain in effect (meaning this test can be used) for the duration of the COVID-19 declaration under Section 564(b)(1) of the Act, 21 U.S.C. section 360bbb-3(b)(1), unless the authorization is terminated or revoked sooner.  Performed at [facility], [address] [place], [place] [address]   Culture, blood (routine x 2)     Status: None (Preliminary result)   Collection Time: 01/05/20  1:41 PM   Specimen: BLOOD  Result Value Ref Range Status   Specimen Description   Final    BLOOD LEFT ANTECUBITAL Performed at [facility], [address] [place], [place] [address]    Special Requests   Final    BOTTLES DRAWN AEROBIC AND ANAEROBIC Blood Culture adequate volume Performed at [facility], [address] [place], [place] [address]    Culture   Final    NO GROWTH 3 DAYS Performed at [facility], [address] [place], [place] [address]    Report Status PENDING  Incomplete  Culture, blood (routine x 2)     Status: None (Preliminary result)   Collection Time: 01/05/20  1:46 PM   Specimen: BLOOD  LEFT HAND  Result Value Ref Range Status   Specimen Description   Final    BLOOD LEFT HAND Performed at [facility], [address] [place], [place] [address]    Special Requests   Final    BOTTLES DRAWN AEROBIC AND ANAEROBIC Blood Culture adequate volume Performed at [facility], [address] [place], [place] [address]    Culture   Final    NO GROWTH 3 DAYS Performed at [facility], [address] [place], [place] [address]    Report Status PENDING  Incomplete  Culture, Urine     Status: None   Collection Time: 01/05/20  2:42 PM   Specimen: Urine, Clean Catch  Result Value Ref Range Status   Specimen Description   Final    URINE, CLEAN CATCH Performed at [facility], [address] [place], [place] [address]    Special Requests  Final    NONE Performed at [facility], [address], [place], [place] [address]    Culture   Final    NO GROWTH Performed at [facility], [address] [place], [place] [address]    Report Status 01/06/2020 FINAL  Final     Labs: BNP (last 3 results) No results for input(s): BNP in the last 8760 hours. Basic Metabolic Panel: Recent Labs  Lab 01/04/20 1409 01/05/20 0555 01/06/20 0548 01/07/20 0526 01/08/20 0505  NA 145 141 153* 152* 147*  K 5.3* 4.8 4.7 4.5 4.3  CL 104 107 121* 119* 112*  CO2 18* 15* 20* 21* 23  GLUCOSE 130* 134* 137* 126* 124*  BUN 165* 154* 73* 54* 52*  CREATININE 15.31* 13.45* 2.97* 1.75* 1.73*  CALCIUM 8.9 8.1* 9.1 9.1 8.9  MG  --   --  2.6*  --  2.5*   Liver Function Tests: Recent Labs  Lab 01/04/20 1409  AST 12*  ALT 19  ALKPHOS 76  BILITOT 1.2  PROT 7.3  ALBUMIN 4.0   No results for input(s): LIPASE, AMYLASE in the last 168 hours. No results for input(s): AMMONIA in the last 168 hours. CBC: Recent Labs  Lab 01/04/20 1409 01/05/20 0555 01/06/20 0548 01/07/20 0526 01/08/20 0505  WBC 18.6* 15.1* 16.7*  14.3* 12.5*  NEUTROABS  --   --  14.8* 12.2* 10.4*  HGB 14.6 14.3 15.2 15.7 15.4  HCT 42.3 42.2 44.7 47.0 47.2  MCV 98.8 100.0 98.5 101.1* 101.5*  PLT 201 193 240 242 236   Cardiac Enzymes: No results for input(s): CKTOTAL, CKMB, CKMBINDEX, TROPONINI in the last 168 hours. BNP: Invalid input(s): POCBNP CBG: Recent Labs  Lab 01/07/20 0645 01/07/20 1112 01/07/20 1659 01/07/20 2357 01/08/20 0623  GLUCAP 112* 155* 116* 118* 106*   D-Dimer No results for input(s): DDIMER in the last 72 hours. Hgb A1c No results for input(s): HGBA1C in the last 72 hours. Lipid Profile No results for input(s): CHOL, HDL, LDLCALC, TRIG, CHOLHDL, LDLDIRECT in the last 72 hours. Thyroid function studies No results for input(s): TSH, T4TOTAL, T3FREE, THYROIDAB in the last 72 hours.  Invalid input(s): FREET3 Anemia work up No results for input(s): VITAMINB12, FOLATE, FERRITIN, TIBC, IRON, RETICCTPCT in the last 72 hours. Urinalysis    Component Value Date/Time   COLORURINE RED (A) 01/04/2020 1126   APPEARANCEUR CLEAR 01/04/2020 1126   LABSPEC 1.012 01/04/2020 1126   LABSPEC 1.015 03/13/2009 1428   PHURINE 5.0 01/04/2020 1126   GLUCOSEU NEGATIVE 01/04/2020 1126   HGBUR LARGE (A) 01/04/2020 1126   BILIRUBINUR NEGATIVE 01/04/2020 1126   KETONESUR NEGATIVE 01/04/2020 1126   PROTEINUR 30 (A) 01/04/2020 1126   NITRITE NEGATIVE 01/04/2020 1126   LEUKOCYTESUR MODERATE (A) 01/04/2020 1126   Sepsis Labs Invalid input(s): PROCALCITONIN,  WBC,  LACTICIDVEN Microbiology Recent Results (from the past 240 hour(s))  SARS Coronavirus 2 by RT PCR (hospital order, performed in [facility] lab) Nasopharyngeal Nasopharyngeal Swab     Status: None   Collection Time: 01/04/20 10:34 PM   Specimen: Nasopharyngeal Swab  Result Value Ref Range Status   SARS Coronavirus 2 NEGATIVE NEGATIVE Final    Comment: (NOTE) SARS-CoV-2 target nucleic acids are NOT DETECTED.  The SARS-CoV-2 RNA is generally  detectable in upper and lower respiratory specimens during the acute phase of infection. The lowest concentration of SARS-CoV-2 viral copies this assay can detect is 250 copies / mL. A negative result does not preclude  SARS-CoV-2 infection and should not be used as the sole basis for treatment or other patient management decisions.  A negative result may occur with improper specimen collection / handling, submission of specimen other than nasopharyngeal swab, presence of viral mutation(s) within the areas targeted by this assay, and inadequate number of viral copies (<250 copies / mL). A negative result must be combined with clinical observations, patient history, and epidemiological information.  Fact Sheet for Patients:   BoilerBrush.com.cy  Fact Sheet for Healthcare Providers: https://pope.com/  This test is not yet approved or  cleared by the [place] FDA and has been authorized for detection and/or diagnosis of SARS-CoV-2 by FDA under an Emergency Use Authorization (EUA).  This EUA will remain in effect (meaning this test can be used) for the duration of the COVID-19 declaration under Section 564(b)(1) of the Act, 21 U.S.C. section 360bbb-3(b)(1), unless the authorization is terminated or revoked sooner.  Performed at [facility], [address] [place], [place] [address]   Culture, blood (routine x 2)     Status: None (Preliminary result)   Collection Time: 01/05/20  1:41 PM   Specimen: BLOOD  Result Value Ref Range Status   Specimen Description   Final    BLOOD LEFT ANTECUBITAL Performed at [facility], [address] [place], [place] [address]    Special Requests   Final    BOTTLES DRAWN AEROBIC AND ANAEROBIC Blood Culture adequate volume Performed at [facility], [address] [place], [place] [address]    Culture   Final    NO GROWTH 3 DAYS Performed  at [facility], [address] [place], [place] [address]    Report Status PENDING  Incomplete  Culture, blood (routine x 2)     Status: None (Preliminary result)   Collection Time: 01/05/20  1:46 PM   Specimen: BLOOD LEFT HAND  Result Value Ref Range Status   Specimen Description   Final    BLOOD LEFT HAND Performed at [facility], [address] [place], [place] [address]    Special Requests   Final    BOTTLES DRAWN AEROBIC AND ANAEROBIC Blood Culture adequate volume Performed at [facility], [address], [place], [place] [address]    Culture   Final    NO GROWTH 3 DAYS Performed at [facility], [address] [place], [place] [address]    Report Status PENDING  Incomplete  Culture, Urine     Status: None   Collection Time: 01/05/20  2:42 PM   Specimen: Urine, Clean Catch  Result Value Ref Range Status   Specimen Description   Final    URINE, CLEAN CATCH Performed at [facility], [address] [place], [place] [address]    Special Requests   Final    NONE Performed at [facility], [address], [place], [place] [address]    Culture   Final    NO GROWTH Performed at [facility], [address] [place], [place] [address]    Report Status 01/06/2020 FINAL  Final     Time coordinating discharge: Over 30 minutes  SIGNED:   Hughie Closs, MD  [facility] 01/08/2020, 10:22 AM  If 7PM-7AM, please contact night-coverage [email or web address]

## 2020-01-09 ENCOUNTER — Encounter (HOSPITAL_COMMUNITY): Payer: Self-pay | Admitting: Urology

## 2020-01-10 ENCOUNTER — Encounter (HOSPITAL_COMMUNITY): Payer: Self-pay | Admitting: Urology

## 2020-01-10 LAB — CULTURE, BLOOD (ROUTINE X 2)
Culture: NO GROWTH
Culture: NO GROWTH
Special Requests: ADEQUATE
Special Requests: ADEQUATE

## 2020-01-10 NOTE — Transfer of Care (Signed)
Immediate Anesthesia Transfer of Care Note  Patient: Claris Gower  Procedure(s) Performed: CYSTOSCOPY WITH RETROGRADE PYELOGRAM/URETERAL STENT PLACEMENT (Left Ureter)  Patient Location: PACU  Anesthesia Type:General  Level of Consciousness: sedated  Airway & Oxygen Therapy: Patient Spontanous Breathing  Post-op Assessment: Report given to RN and Post -op Vital signs reviewed and stable  Post vital signs: Reviewed and stable  Last Vitals:  Vitals Value Taken Time  BP 147/86 01/08/20 0538  Temp 36.4 C 01/08/20 0538  Pulse 54 01/08/20 0538  Resp 17 01/08/20 0538  SpO2 96 % 01/08/20 0538    Last Pain:  Vitals:   01/08/20 0953  TempSrc:   PainSc: 0-No pain         Complications: No complications documented.

## 2020-01-15 NOTE — Anesthesia Postprocedure Evaluation (Signed)
Anesthesia Post Note  Patient: Gerald Diaz  Procedure(s) Performed: CYSTOSCOPY WITH RETROGRADE PYELOGRAM/URETERAL STENT PLACEMENT (Left Ureter)     Patient location during evaluation: PACU Anesthesia Type: General Level of consciousness: awake and alert Pain management: pain level controlled Vital Signs Assessment: post-procedure vital signs reviewed and stable Respiratory status: spontaneous breathing, nonlabored ventilation, respiratory function stable and patient connected to nasal cannula oxygen Cardiovascular status: blood pressure returned to baseline and stable Postop Assessment: no apparent nausea or vomiting Anesthetic complications: no   No complications documented.  Last Vitals:  Vitals:   01/07/20 2215 01/08/20 0538  BP: (!) 144/92 (!) 147/86  Pulse: 76 (!) 54  Resp: 17 17  Temp: (!) 36.4 C (!) 36.4 C  SpO2: 99% 96%    Last Pain:  Vitals:   01/08/20 0953  TempSrc:   PainSc: 0-No pain                 Isauro Skelley S

## 2020-01-16 ENCOUNTER — Ambulatory Visit: Payer: Medicare Other | Attending: Critical Care Medicine

## 2020-01-16 ENCOUNTER — Ambulatory Visit: Payer: Medicare Other

## 2020-01-16 DIAGNOSIS — Z23 Encounter for immunization: Secondary | ICD-10-CM

## 2020-01-16 NOTE — Progress Notes (Signed)
   Covid-19 Vaccination Clinic  Name:  Gerald Diaz    MRN: [id number] DOB: 05/15/1955  01/16/2020  Mr. Gerald Diaz was observed post Covid-19 immunization for 15 minutes without incident. He was provided with Vaccine Information Sheet and instruction to access the V-Safe system.   Mr. Gerald Diaz was instructed to call 911 with any severe reactions post vaccine: Marland Kitchen Difficulty breathing  . Swelling of face and throat  . A fast heartbeat  . A bad rash all over body  . Dizziness and weakness   Immunizations Administered    Name Date Dose VIS Date Route   Moderna COVID-19 Vaccine 01/16/2020 10:45 AM 0.5 mL 04/2019 Intramuscular   Manufacturer: Moderna   Lot: [id number]   NDC: 89169-450-38

## 2020-01-18 ENCOUNTER — Ambulatory Visit: Payer: Self-pay | Admitting: Urology

## 2020-01-25 ENCOUNTER — Ambulatory Visit: Payer: Self-pay | Admitting: Urology

## 2020-02-06 ENCOUNTER — Encounter (HOSPITAL_COMMUNITY): Payer: Self-pay

## 2020-02-06 ENCOUNTER — Inpatient Hospital Stay (HOSPITAL_COMMUNITY)
Admission: EM | Admit: 2020-02-06 | Discharge: 2020-02-10 | DRG: 660 | Disposition: A | Discharge status: Home / Self Care | Payer: Medicare Other | Attending: Internal Medicine | Admitting: Internal Medicine

## 2020-02-06 ENCOUNTER — Emergency Department (HOSPITAL_COMMUNITY): Payer: Medicare Other

## 2020-02-06 ENCOUNTER — Other Ambulatory Visit: Payer: Self-pay

## 2020-02-06 DIAGNOSIS — Z20822 Contact with and (suspected) exposure to covid-19: Secondary | ICD-10-CM | POA: Diagnosis present

## 2020-02-06 DIAGNOSIS — N132 Hydronephrosis with renal and ureteral calculous obstruction: Secondary | ICD-10-CM | POA: Diagnosis present

## 2020-02-06 DIAGNOSIS — S0031XA Abrasion of nose, initial encounter: Secondary | ICD-10-CM | POA: Diagnosis present

## 2020-02-06 DIAGNOSIS — Z419 Encounter for procedure for purposes other than remedying health state, unspecified: Secondary | ICD-10-CM

## 2020-02-06 DIAGNOSIS — R64 Cachexia: Secondary | ICD-10-CM | POA: Diagnosis present

## 2020-02-06 DIAGNOSIS — Z87891 Personal history of nicotine dependence: Secondary | ICD-10-CM

## 2020-02-06 DIAGNOSIS — N210 Calculus in bladder: Secondary | ICD-10-CM | POA: Diagnosis present

## 2020-02-06 DIAGNOSIS — Z87442 Personal history of urinary calculi: Secondary | ICD-10-CM

## 2020-02-06 DIAGNOSIS — N189 Chronic kidney disease, unspecified: Secondary | ICD-10-CM | POA: Diagnosis present

## 2020-02-06 DIAGNOSIS — Y92009 Unspecified place in unspecified non-institutional (private) residence as the place of occurrence of the external cause: Secondary | ICD-10-CM | POA: Diagnosis not present

## 2020-02-06 DIAGNOSIS — N400 Enlarged prostate without lower urinary tract symptoms: Secondary | ICD-10-CM | POA: Diagnosis present

## 2020-02-06 DIAGNOSIS — Z905 Acquired absence of kidney: Secondary | ICD-10-CM

## 2020-02-06 DIAGNOSIS — R627 Adult failure to thrive: Secondary | ICD-10-CM | POA: Diagnosis present

## 2020-02-06 DIAGNOSIS — F0390 Unspecified dementia without behavioral disturbance: Secondary | ICD-10-CM | POA: Diagnosis present

## 2020-02-06 DIAGNOSIS — N179 Acute kidney failure, unspecified: Secondary | ICD-10-CM | POA: Diagnosis present

## 2020-02-06 DIAGNOSIS — W1830XA Fall on same level, unspecified, initial encounter: Secondary | ICD-10-CM | POA: Diagnosis present

## 2020-02-06 DIAGNOSIS — Z6822 Body mass index (BMI) 22.0-22.9, adult: Secondary | ICD-10-CM | POA: Diagnosis not present

## 2020-02-06 DIAGNOSIS — N201 Calculus of ureter: Secondary | ICD-10-CM | POA: Diagnosis present

## 2020-02-06 HISTORY — DX: Acquired absence of kidney: Z90.5

## 2020-02-06 HISTORY — DX: Calculus of kidney: N20.0

## 2020-02-06 LAB — COMPREHENSIVE METABOLIC PANEL
ALT: 19 U/L (ref 0–44)
AST: 20 U/L (ref 15–41)
Albumin: 3.8 g/dL (ref 3.5–5.0)
Alkaline Phosphatase: 79 U/L (ref 38–126)
Anion gap: 15 (ref 5–15)
BUN: 27 mg/dL — ABNORMAL HIGH (ref 8–23)
CO2: 24 mmol/L (ref 22–32)
Calcium: 9.7 mg/dL (ref 8.9–10.3)
Chloride: 105 mmol/L (ref 98–111)
Creatinine, Ser: 2.26 mg/dL — ABNORMAL HIGH (ref 0.61–1.24)
GFR calc non Af Amer: 29 mL/min — ABNORMAL LOW (ref >60)
Glucose, Bld: 120 mg/dL — ABNORMAL HIGH (ref 70–99)
Potassium: 4.2 mmol/L (ref 3.5–5.1)
Sodium: 144 mmol/L (ref 135–145)
Total Bilirubin: 1.1 mg/dL (ref 0.3–1.2)
Total Protein: 6.9 g/dL (ref 6.5–8.1)

## 2020-02-06 LAB — CBC
HCT: 46 % (ref 39.0–52.0)
Hemoglobin: 15.4 g/dL (ref 13.0–17.0)
MCH: 32.6 pg (ref 26.0–34.0)
MCHC: 33.5 g/dL (ref 30.0–36.0)
MCV: 97.5 fL (ref 80.0–100.0)
Platelets: 234 10*3/uL (ref 150–400)
RBC: 4.72 MIL/uL (ref 4.22–5.81)
RDW: 11.9 % (ref 11.5–15.5)
WBC: 10.8 10*3/uL — ABNORMAL HIGH (ref 4.0–10.5)
nRBC: 0 % (ref 0.0–0.2)

## 2020-02-06 LAB — URINALYSIS, ROUTINE W REFLEX MICROSCOPIC
Bacteria, UA: NONE SEEN
Bilirubin Urine: NEGATIVE
Glucose, UA: 50 mg/dL — AB
Ketones, ur: 20 mg/dL — AB
Nitrite: NEGATIVE
Protein, ur: 100 mg/dL — AB
RBC / HPF: >50 RBC/hpf — ABNORMAL HIGH (ref 0–5)
Specific Gravity, Urine: 1.017 (ref 1.005–1.030)
WBC, UA: >50 WBC/hpf — ABNORMAL HIGH (ref 0–5)
pH: 5 (ref 5.0–8.0)

## 2020-02-06 LAB — RESPIRATORY PANEL BY RT PCR (FLU A&B, COVID)
Influenza A by PCR: NEGATIVE
Influenza B by PCR: NEGATIVE
SARS Coronavirus 2 by RT PCR: NEGATIVE

## 2020-02-06 LAB — LIPASE, BLOOD: Lipase: 29 U/L (ref 11–51)

## 2020-02-06 MED ORDER — ONDANSETRON HCL 4 MG PO TABS: 4.0000 mg | ORAL_TABLET | Freq: Four times a day (QID) | ORAL | Status: DC | PRN

## 2020-02-06 MED ORDER — ONDANSETRON HCL 4 MG/2ML IJ SOLN
4.0000 mg | Freq: Four times a day (QID) | INTRAMUSCULAR | Status: DC | PRN
  Administered 2020-02-08 – 2020-02-10 (×2): 4 mg via INTRAVENOUS
  Filled 2020-02-06 (×2): qty 2

## 2020-02-06 MED ORDER — OXYCODONE HCL 5 MG PO TABS: 5.0000 mg | ORAL_TABLET | ORAL | Status: DC | PRN

## 2020-02-06 MED ORDER — LACTATED RINGERS IV SOLN: INTRAVENOUS | Status: DC

## 2020-02-06 MED ORDER — FLUCONAZOLE 100 MG PO TABS
100.0000 mg | ORAL_TABLET | Freq: Every day | ORAL | Status: DC
  Administered 2020-02-07 – 2020-02-10 (×4): 100 mg via ORAL
  Filled 2020-02-06 (×5): qty 1

## 2020-02-06 MED ORDER — BISACODYL 5 MG PO TBEC: 5.0000 mg | DELAYED_RELEASE_TABLET | Freq: Every day | ORAL | Status: DC | PRN

## 2020-02-06 MED ORDER — FENTANYL CITRATE (PF) 100 MCG/2ML IJ SOLN
50.0000 ug | Freq: Once | INTRAMUSCULAR | Status: AC
  Administered 2020-02-06: 50 ug via INTRAVENOUS
  Filled 2020-02-06: qty 2

## 2020-02-06 MED ORDER — MORPHINE SULFATE (PF) 2 MG/ML IV SOLN
2.0000 mg | INTRAVENOUS | Status: DC | PRN
  Administered 2020-02-09 – 2020-02-10 (×3): 2 mg via INTRAVENOUS
  Filled 2020-02-06 (×3): qty 1

## 2020-02-06 MED ORDER — ACETAMINOPHEN 325 MG PO TABS: 650.0000 mg | ORAL_TABLET | Freq: Four times a day (QID) | ORAL | Status: DC | PRN

## 2020-02-06 MED ORDER — ACETAMINOPHEN 650 MG RE SUPP: 650.0000 mg | Freq: Four times a day (QID) | RECTAL | Status: DC | PRN

## 2020-02-06 MED ORDER — SERTRALINE HCL 50 MG PO TABS
25.0000 mg | ORAL_TABLET | Freq: Every day | ORAL | Status: DC
  Administered 2020-02-07 – 2020-02-10 (×4): 25 mg via ORAL
  Filled 2020-02-06 (×4): qty 1

## 2020-02-06 MED ORDER — DOCUSATE SODIUM 100 MG PO CAPS
100.0000 mg | ORAL_CAPSULE | Freq: Two times a day (BID) | ORAL | Status: DC
  Administered 2020-02-07 – 2020-02-10 (×7): 100 mg via ORAL
  Filled 2020-02-06 (×8): qty 1

## 2020-02-06 MED ORDER — POLYETHYLENE GLYCOL 3350 17 G PO PACK: 17.0000 g | PACK | Freq: Every day | ORAL | Status: DC | PRN

## 2020-02-06 MED ORDER — SODIUM CHLORIDE 0.9 % IV BOLUS
1000.0000 mL | Freq: Once | INTRAVENOUS | Status: AC
  Administered 2020-02-06: 1000 mL via INTRAVENOUS

## 2020-02-06 MED ORDER — ONDANSETRON HCL 4 MG/2ML IJ SOLN
4.0000 mg | Freq: Once | INTRAMUSCULAR | Status: AC
  Administered 2020-02-06: 4 mg via INTRAVENOUS
  Filled 2020-02-06: qty 2

## 2020-02-06 NOTE — ED Notes (Signed)
Updated RN Byrd Hesselbach of [phone] informing her caregiver would like medication fluconazole to be placed on hold while awaiting approval from family. 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 Sent medication up with pt at this time.

## 2020-02-06 NOTE — Progress Notes (Signed)
Pharmacy Antibiotic Note  Gerald Diaz is a 64 y.o. male admitted on 02/06/2020 with incidental funguria on UA.  Pharmacy has been consulted for fluconazole dosing. Pt has significant dementia at baseline but does complain of abdominal pain and nausea. Of note, patient also has only 1 kidney (s/p childhood nephrectomy) and has currently kidney stones in the remaining kidney. This is day 1 of therapy. Pt is afebrile with WBC 10.8. QTc 420 on 10/5. Current renal function is CrCl of 48ml/min and Scr 2.26 as of 10/5.  Plan: fluconazole 100mg  PO  daily Monitor CBC, clinical progress, micro data, QTc as able, and renal function     Temp (24hrs), Avg:98.1 F (36.7 C), Min:98.1 F (36.7 C), Max:98.1 F (36.7 C)  Recent Labs  Lab 02/06/20 1001  WBC 10.8*  CREATININE 2.26*    CrCl cannot be calculated (Unknown ideal weight.).    No Known Allergies  Antimicrobials this admission: Fluconazole 10/5>  Microbiology results: 10/5 UCx: sent 10/5 Resp panel: sent 10/5 UA: + budding yeast  Thank you for allowing pharmacy to be a part of this patient's care.  04/07/20, PharmD PGY1 Pharmacy Resident 02/06/2020 7:41 PM

## 2020-02-06 NOTE — Consult Note (Signed)
Reason for Consult:Urolithiasis, solitary kidney  Referring Physician: Jonah Blue MD.   Gerald Diaz is an 64 y.o. male.   HPI:   1 - Left Ureteral / Renal Stones - approx 1cm volume stacked left ureteral stones, 42mm renal stone x 3, and several bladder stones by ER CT 02/2020. Left JJ stent placed recently in good position (placed by [facility] 01/2020). Then seen at [facility] 9/27 with plan for ureteroscopy as pt has a [employer] plan that we do not contract with, however, per guardian Smitty Cords never scheduled his surgery and is not responding to their calls which is understandably frustrating.   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  3 - Acute on Chronic Renal Failure - Cr 18 01/2020 up from baseline of <1 2010 with nadir of 1.7 after stenting and hydration. 2.2 by ER labs this admission. Poor PO intake per family.    4 - Funguria - incidental funguria on UA 02/2020. No known h/o diabetes.   PMH sig for severe dementia. 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 Family not involved.  Tyson Dense is  Eliot Ford at [phone] (not family).  Today "[name]" is seen in consultation for above.   Past Medical History:  Diagnosis Date  . Anemia   . Dementia (HCC)   . Nephrolithiasis     Past Surgical History:  Procedure Laterality Date  . CYSTOSCOPY W/ URETERAL STENT PLACEMENT Left 01/05/2020   Procedure: CYSTOSCOPY WITH RETROGRADE PYELOGRAM/URETERAL STENT PLACEMENT;  Surgeon: Crista Elliot, MD;  Location: [facility];  Service: Urology;  Laterality: Left;  . NEPHRECTOMY     pt was 10    Family History  Family history unknown: Yes    Social History:  reports that he has quit smoking. He has never used smokeless tobacco. He reports that he does not drink alcohol and does not use drugs.  Allergies: No Known Allergies  Medications: I have reviewed the patient's current medications.  Results for orders  placed or performed during the hospital encounter of 02/06/20 (from the past 48 hour(s))  Lipase, blood     Status: None   Collection Time: 02/06/20 10:01 AM  Result Value Ref Range   Lipase 29 11 - 51 U/L    Comment: Performed at [facility], [address], [place], [place] [address]  Comprehensive metabolic panel     Status: Abnormal   Collection Time: 02/06/20 10:01 AM  Result Value Ref Range   Sodium 144 135 - 145 mmol/L   Potassium 4.2 3.5 - 5.1 mmol/L   Chloride 105 98 - 111 mmol/L   CO2 24 22 - 32 mmol/L   Glucose, Bld 120 (H) 70 - 99 mg/dL    Comment: Glucose reference range applies only to samples taken after fasting for at least 8 hours.   BUN 27 (H) 8 - 23 mg/dL   Creatinine, Ser 7.98 (H) 0.61 - 1.24 mg/dL   Calcium 9.7 8.9 - 92.1 mg/dL   Total Protein 6.9 6.5 - 8.1 g/dL   Albumin 3.8 3.5 - 5.0 g/dL   AST 20 15 - 41 U/L   ALT 19 0 - 44 U/L   Alkaline Phosphatase 79 38 - 126 U/L   Total Bilirubin 1.1 0.3 - 1.2 mg/dL   GFR calc non Af Amer 29 (L) >60 mL/min   Anion gap 15 5 - 15    Comment:  Performed at [facility], [address] [place], [place] [address]  CBC     Status: Abnormal   Collection Time: 02/06/20 10:01 AM  Result Value Ref Range   WBC 10.8 (H) 4.0 - 10.5 K/uL   RBC 4.72 4.22 - 5.81 MIL/uL   Hemoglobin 15.4 13.0 - 17.0 g/dL   HCT 40.946.0 39 - 52 %   MCV 97.5 80.0 - 100.0 fL   MCH 32.6 26.0 - 34.0 pg   MCHC 33.5 30.0 - 36.0 g/dL   RDW 81.111.9 91.411.5 - 78.215.5 %   Platelets 234 150 - 400 K/uL   nRBC 0.0 0.0 - 0.2 %    Comment: Performed at [facility], [address] [place], [place] [address]  Urinalysis, Routine w reflex microscopic     Status: Abnormal   Collection Time: 02/06/20 10:01 AM  Result Value Ref Range   Color, Urine AMBER (A) YELLOW    Comment: BIOCHEMICALS MAY BE AFFECTED BY COLOR   APPearance CLOUDY (A) CLEAR   Specific Gravity, Urine 1.017 1.005 - 1.030   pH 5.0 5.0 - 8.0   Glucose, UA 50 (A) NEGATIVE mg/dL   Hgb  urine dipstick LARGE (A) NEGATIVE   Bilirubin Urine NEGATIVE NEGATIVE   Ketones, ur 20 (A) NEGATIVE mg/dL   Protein, ur 308100 (A) NEGATIVE mg/dL   Nitrite NEGATIVE NEGATIVE   Leukocytes,Ua SMALL (A) NEGATIVE   RBC / HPF >50 (H) 0 - 5 RBC/hpf   WBC, UA >50 (H) 0 - 5 WBC/hpf   Bacteria, UA NONE SEEN NONE SEEN   Budding Yeast PRESENT    Hyaline Casts, UA PRESENT     Comment: Performed at [facility], [address] [place], [place] [address]    CT Abdomen Pelvis Wo Contrast  Result Date: 02/06/2020 CLINICAL DATA:  Flank pain.  Confusion. EXAM: CT ABDOMEN AND PELVIS WITHOUT CONTRAST TECHNIQUE: Multidetector CT imaging of the abdomen and pelvis was performed following the standard protocol without IV contrast. COMPARISON:  CT scan 01/04/2020 FINDINGS: Lower chest: The lung bases are clear of acute process. No pleural effusion or pulmonary lesions. The heart is normal in size. No pericardial effusion. The distal esophagus and aorta are unremarkable. Hepatobiliary: No hepatic lesions are identified without contrast. The gallbladder appears normal. No common bile duct dilatation. Pancreas: No mass, inflammation or ductal dilatation. Spleen: Normal size.  No focal lesions. Adrenals/Urinary Tract: The adrenal glands are unremarkable. The right kidney is surgically absent. The left kidney contains a double-J ureteral stent. Mild hydroureteronephrosis noted. There are 2 adjacent calculi in the left mid ureter at the L4 level. Both calculi measure approximately 7 mm in length. I do not see any distal ureteral calculi. There are small layering calculi in the bladder and a larger calculus in the left ladder near the loop of the double-J stent. This measures 5.5 mm. Stomach/Bowel: The stomach, duodenum, small bowel and colon are grossly normal without oral contrast. No inflammatory changes, mass lesions or obstructive findings. The appendix is normal. Vascular/Lymphatic: Stable scattered vascular  calcifications. No abdominal or pelvic lymphadenopathy. Reproductive: Mild prostate gland enlargement with median lobe hypertrophy impressing on the base of the bladder. The seminal vesicles are unremarkable. Other: No pelvic mass or adenopathy. No free pelvic fluid collections. No inguinal mass or adenopathy. No abdominal wall hernia or subcutaneous lesions. Musculoskeletal: No significant bony findings. IMPRESSION: 1. Two adjacent calculi in the left mid ureter at the L4 level. Both calculi measure  approximately 7 mm in length. 2. Mild left-sided hydroureteronephrosis. 3. Status post right nephrectomy. 4. Stable bladder calculi. 5. No other significant abdominal/pelvic findings and no mass lesions or adenopathy. 6. Aortic atherosclerosis. Aortic Atherosclerosis (ICD10-I70.0). Electronically Signed   By: Rudie Meyer M.D.   On: 02/06/2020 15:00   CT Head Wo Contrast  Result Date: 02/06/2020 CLINICAL DATA:  Larey Seat.  Hit head.  Altered mental status. EXAM: CT HEAD WITHOUT CONTRAST CT CERVICAL SPINE WITHOUT CONTRAST TECHNIQUE: Multidetector CT imaging of the head and cervical spine was performed following the standard protocol without intravenous contrast. Multiplanar CT image reconstructions of the cervical spine were also generated. COMPARISON:  Brain MRI 10/15/2010 FINDINGS: CT HEAD FINDINGS Brain: Mild age advanced cerebral atrophy, ventriculomegaly and periventricular white matter disease. No extra-axial fluid collections are identified. No CT findings for acute hemispheric infarction or intracranial hemorrhage. No mass lesions. The brainstem and cerebellum are normal. Vascular: Mild vascular calcifications but no aneurysm or hyperdense vessels. Skull: No acute skull fracture or worrisome bone lesions. Sinuses/Orbits: The paranasal sinuses and mastoid air cells are clear. The globes are intact. Other: Small left frontal scalp hematoma but no foreign body or underlying fracture. CT CERVICAL SPINE FINDINGS  Alignment: Normal. Skull base and vertebrae: No acute fracture. No primary bone lesion or focal pathologic process. The C2-3 facets are fused bilaterally. Soft tissues and spinal canal: No prevertebral fluid or swelling. No visible canal hematoma. Disc levels: The spinal canal is quite generous. No large disc protrusions, significant spinal or foraminal stenosis. Air-filled bone cyst noted in the C7 vertebral body. Upper chest: The visualized lung apices are grossly clear. Other: No neck mass or adenopathy. IMPRESSION: 1. Mild age advanced cerebral atrophy, ventriculomegaly and periventricular white matter disease. 2. No acute intracranial findings or skull fracture. 3. Normal alignment of the cervical vertebral bodies and no acute cervical spine fracture. Electronically Signed   By: Rudie Meyer M.D.   On: 02/06/2020 14:52   CT Cervical Spine Wo Contrast  Result Date: 02/06/2020 CLINICAL DATA:  Larey Seat.  Hit head.  Altered mental status. EXAM: CT HEAD WITHOUT CONTRAST CT CERVICAL SPINE WITHOUT CONTRAST TECHNIQUE: Multidetector CT imaging of the head and cervical spine was performed following the standard protocol without intravenous contrast. Multiplanar CT image reconstructions of the cervical spine were also generated. COMPARISON:  Brain MRI 10/15/2010 FINDINGS: CT HEAD FINDINGS Brain: Mild age advanced cerebral atrophy, ventriculomegaly and periventricular white matter disease. No extra-axial fluid collections are identified. No CT findings for acute hemispheric infarction or intracranial hemorrhage. No mass lesions. The brainstem and cerebellum are normal. Vascular: Mild vascular calcifications but no aneurysm or hyperdense vessels. Skull: No acute skull fracture or worrisome bone lesions. Sinuses/Orbits: The paranasal sinuses and mastoid air cells are clear. The globes are intact. Other: Small left frontal scalp hematoma but no foreign body or underlying fracture. CT CERVICAL SPINE FINDINGS Alignment:  Normal. Skull base and vertebrae: No acute fracture. No primary bone lesion or focal pathologic process. The C2-3 facets are fused bilaterally. Soft tissues and spinal canal: No prevertebral fluid or swelling. No visible canal hematoma. Disc levels: The spinal canal is quite generous. No large disc protrusions, significant spinal or foraminal stenosis. Air-filled bone cyst noted in the C7 vertebral body. Upper chest: The visualized lung apices are grossly clear. Other: No neck mass or adenopathy. IMPRESSION: 1. Mild age advanced cerebral atrophy, ventriculomegaly and periventricular white matter disease. 2. No acute intracranial findings or skull fracture. 3. Normal alignment of the cervical vertebral  bodies and no acute cervical spine fracture. Electronically Signed   By: Rudie Meyer M.D.   On: 02/06/2020 14:52    Review of Systems  Unable to perform ROS: Dementia   Blood pressure 120/68, pulse 100, temperature 98.1 F (36.7 C), temperature source Oral, resp. rate 18, SpO2 100 %. Physical Exam Constitutional:      Comments: NAD, pleasant but severely demented in ER with caretaker at bedside.   HENT:     Nose: Nose normal.  Eyes:     Pupils: Pupils are equal, round, and reactive to light.  Cardiovascular:     Rate and Rhythm: Normal rate.     Pulses: Normal pulses.  Pulmonary:     Effort: Pulmonary effort is normal.  Abdominal:     General: Abdomen is flat.  Genitourinary:    Penis: Normal.      Comments: No CVAT Musculoskeletal:     Cervical back: Normal range of motion.  Skin:    General: Skin is warm.  Neurological:     Mental Status: He is alert.     Assessment/Plan:  1 - Left Ureteral / Renal Stones - now temporized with stent that is in good position. Rec proceed as previously planned with elective ureteroscopy with goal of stone free possibley later this admission if can clear insurance hurdles v. At [facility].  2 - Solitary Left Kidney - no intervention.   3 - Acute on  Chronic Renal Failure - Cr at recent baselien. He likely sustained at least some element of permanent damage at prior recent obsructive insult.   4 - Funguria - possibly contributing to nauesa at present.   Sebastian Ache 02/06/2020, 7:15 PM

## 2020-02-06 NOTE — ED Notes (Addendum)
Pt has hx dementia; per pt's caregiver; pt was seen 1 month ago, found 90mm stone L kidney (only has 1 kidney); had stent placed; since then, pt has been c/o epigastric pain, nausea, and has had decreased appetite; caregiver endorses pt has lost 30 lbs in last month

## 2020-02-06 NOTE — ED Notes (Signed)
Caregiver at bedside to call [facility] to find out if they are accepting pt to take fluconazole. Discussed reason for adminstration waiting for approval.

## 2020-02-06 NOTE — ED Provider Notes (Signed)
[facility] EMERGENCY DEPARTMENT Provider Note   CSN: [id number] Arrival date & time: 02/06/20  2951     History No chief complaint on file.   Gerald Diaz is a 64 y.o. male with pertinent past medical history of severe dementia that presents to the emergency department today for abdominal pain.  Patient is present with nurse caretaker who is able to provide most of the history since patient is severely demented.  Patient is a level 5 caveat.  Per caretaker, patient was diagnosed with 9 mm kidney stone 1 month ago, had stent placed and was supposed to see urologist next month to schedule appointment for removal.  States that about 3 weeks ago patient started having abdominal pain, has continued until today.  States that he has lost 23 pounds in 3 weeks.  Never complains of anything due to dementia, however today he started saying that he was having abdominal pain.  No vomiting, diarrhea.  Caretaker says that patient has good urine output.  Has been dark unsure if there is any blood in his urine.  No fevers, chills.  Patient has not complained of any other pain.  No shortness of breath.  Also mentions that patient fell today, hit his forehead.  Patient is unable to tell us much about this, however according to caretaker she states that unsure if he lost consciousness but he was awake and alert when they found him a couple seconds later.  She states that he probably fell due to weakness since he has been losing a lot of weight.  Has been skipping meals. Not on a blood thinner. 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  Has not seen anyone for this yet.  Has not taken anything for this.  Denies any medications besides sertraline.  HPI     Past Medical History:  Diagnosis Date  . Anemia   . Dementia (HCC)   . Nephrolithiasis     Patient Active Problem List   Diagnosis Date Noted  . AKI (acute kidney injury) (HCC) 02/06/2020  . 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  Dementia (HCC) 01/05/2020  . Ureteral stone with hydronephrosis   . ARF (acute renal failure) (HCC) 01/04/2020    Past Surgical History:  Procedure Laterality Date  . CYSTOSCOPY W/ URETERAL STENT PLACEMENT Left 01/05/2020   Procedure: CYSTOSCOPY WITH RETROGRADE PYELOGRAM/URETERAL STENT PLACEMENT;  Surgeon: Crista Elliot, MD;  Location: [facility]S;  Service: Urology;  Laterality: Left;  . NEPHRECTOMY     pt was 10       Family History  Family history unknown: Yes    Social History   Tobacco Use  . Smoking status: Former Games developer  . Smokeless tobacco: Never Used  . Tobacco comment: history generally unknown  Substance Use Topics  . Alcohol use: No  . Drug use: No    Home Medications Prior to Admission medications   Medication Sig Start Date End Date Taking? Authorizing Provider  sertraline (ZOLOFT) 25 MG tablet Take 25 mg by mouth daily. 01/01/20  Yes [provider]    Allergies    Patient has no known allergies.  Review of Systems   Review of Systems  Unable to perform ROS: Dementia    Physical Exam Updated Vital Signs BP 120/68   Pulse 100   Temp 98.1 F (36.7 C) (Oral)   Resp 18   SpO2 100%   Physical Exam Exam conducted with a chaperone present.  Constitutional:      General: He is not in acute distress.    Appearance: Normal appearance. He is not ill-appearing, toxic-appearing or diaphoretic.  HENT:     Head: Normocephalic.     Comments: Small hematoma to forehead, no abrasions    Nose: Nose normal.     Comments: Small abrasion on nose.     Mouth/Throat:     Mouth: Mucous membranes are moist.     Pharynx: Oropharynx is clear.  Eyes:     General: No scleral icterus.    Extraocular Movements: Extraocular movements intact.     Pupils: Pupils are equal, round, and reactive to light.  Cardiovascular:     Rate and Rhythm: Normal rate and regular rhythm.     Pulses: Normal pulses.     Heart sounds: Normal heart sounds.  Pulmonary:      Effort: Pulmonary effort is normal. No respiratory distress.     Breath sounds: Normal breath sounds. No stridor. No wheezing, rhonchi or rales.  Chest:     Chest wall: No tenderness.  Abdominal:     General: Abdomen is flat. There is no distension.     Palpations: Abdomen is soft.     Tenderness: There is no abdominal tenderness. There is no right CVA tenderness, left CVA tenderness, guarding or rebound.  Genitourinary:    Pubic Area: No rash or pubic lice.      Penis: Normal.      Testes: Normal. Cremasteric reflex is present.        Right: Tenderness or swelling not present.        Left: Tenderness or swelling not present.     Epididymis:     Right: Normal.     Left: Normal.  Musculoskeletal:        General: No swelling or tenderness. Normal range of motion.     Cervical back: Normal range of motion and neck supple. No rigidity.     Right lower leg: No edema.     Left lower leg: No edema.  Skin:    General: Skin is warm and dry.     Capillary Refill: Capillary refill takes less than 2 seconds.     Coloration: Skin is not pale.  Neurological:     General: No focal deficit present.     Mental Status: He is alert and oriented to person, place, and time.  Psychiatric:        Mood and Affect: Mood normal.        Behavior: Behavior normal.     ED Results / Procedures / Treatments   Labs (all labs ordered are listed, but only abnormal results are displayed) Labs Reviewed  COMPREHENSIVE METABOLIC PANEL - Abnormal; Notable for the following components:      Result Value   Glucose, Bld 120 (*)    BUN 27 (*)    Creatinine, Ser 2.26 (*)    GFR calc non Af Amer 29 (*)    All other components within normal limits  CBC - Abnormal; Notable for the following components:   WBC 10.8 (*)    All other components within normal limits  URINALYSIS, ROUTINE W REFLEX MICROSCOPIC - Abnormal; Notable for the following components:   Color, Urine AMBER (*)    APPearance CLOUDY (*)     Glucose, UA 50 (*)    Hgb urine dipstick LARGE (*)    Ketones, ur 20 (*)    Protein, ur 100 (*)    Leukocytes,Ua  SMALL (*)    RBC / HPF >50 (*)    WBC, UA >50 (*)    All other components within normal limits  URINE CULTURE  LIPASE, BLOOD  BASIC METABOLIC PANEL  CBC    EKG EKG Interpretation  Date/Time:  Tuesday February 06 2020 13:44:30 EDT Ventricular Rate:  82 PR Interval:    QRS Duration: 95 QT Interval:  359 QTC Calculation: 420 R Axis:   77 Text Interpretation: Sinus arrhythmia Otherwise within normal limits When compared with ECG of EARLIER SAME DATE HEART RATE has decreased Premature atrial complexes are no longer present Confirmed by Dione Booze ([id number]) on 02/06/2020 4:59:34 PM   Radiology CT Abdomen Pelvis Wo Contrast  Result Date: 02/06/2020 CLINICAL DATA:  Flank pain.  Confusion. EXAM: CT ABDOMEN AND PELVIS WITHOUT CONTRAST TECHNIQUE: Multidetector CT imaging of the abdomen and pelvis was performed following the standard protocol without IV contrast. COMPARISON:  CT scan 01/04/2020 FINDINGS: Lower chest: The lung bases are clear of acute process. No pleural effusion or pulmonary lesions. The heart is normal in size. No pericardial effusion. The distal esophagus and aorta are unremarkable. Hepatobiliary: No hepatic lesions are identified without contrast. The gallbladder appears normal. No common bile duct dilatation. Pancreas: No mass, inflammation or ductal dilatation. Spleen: Normal size.  No focal lesions. Adrenals/Urinary Tract: The adrenal glands are unremarkable. The right kidney is surgically absent. The left kidney contains a double-J ureteral stent. Mild hydroureteronephrosis noted. There are 2 adjacent calculi in the left mid ureter at the L4 level. Both calculi measure approximately 7 mm in length. I do not see any distal ureteral calculi. There are small layering calculi in the bladder and a larger calculus in the left ladder near the loop of the double-J stent.  This measures 5.5 mm. Stomach/Bowel: The stomach, duodenum, small bowel and colon are grossly normal without oral contrast. No inflammatory changes, mass lesions or obstructive findings. The appendix is normal. Vascular/Lymphatic: Stable scattered vascular calcifications. No abdominal or pelvic lymphadenopathy. Reproductive: Mild prostate gland enlargement with median lobe hypertrophy impressing on the base of the bladder. The seminal vesicles are unremarkable. Other: No pelvic mass or adenopathy. No free pelvic fluid collections. No inguinal mass or adenopathy. No abdominal wall hernia or subcutaneous lesions. Musculoskeletal: No significant bony findings. IMPRESSION: 1. Two adjacent calculi in the left mid ureter at the L4 level. Both calculi measure approximately 7 mm in length. 2. Mild left-sided hydroureteronephrosis. 3. Status post right nephrectomy. 4. Stable bladder calculi. 5. No other significant abdominal/pelvic findings and no mass lesions or adenopathy. 6. Aortic atherosclerosis. Aortic Atherosclerosis (ICD10-I70.0). Electronically Signed   By: Rudie Meyer M.D.   On: 02/06/2020 15:00   CT Head Wo Contrast  Result Date: 02/06/2020 CLINICAL DATA:  Larey Seat.  Hit head.  Altered mental status. EXAM: CT HEAD WITHOUT CONTRAST CT CERVICAL SPINE WITHOUT CONTRAST TECHNIQUE: Multidetector CT imaging of the head and cervical spine was performed following the standard protocol without intravenous contrast. Multiplanar CT image reconstructions of the cervical spine were also generated. COMPARISON:  Brain MRI 10/15/2010 FINDINGS: CT HEAD FINDINGS Brain: Mild age advanced cerebral atrophy, ventriculomegaly and periventricular white matter disease. No extra-axial fluid collections are identified. No CT findings for acute hemispheric infarction or intracranial hemorrhage. No mass lesions. The brainstem and cerebellum are normal. Vascular: Mild vascular calcifications but no aneurysm or hyperdense vessels. Skull: No  acute skull fracture or worrisome bone lesions. Sinuses/Orbits: The paranasal sinuses and mastoid air cells are clear. The globes are  intact. Other: Small left frontal scalp hematoma but no foreign body or underlying fracture. CT CERVICAL SPINE FINDINGS Alignment: Normal. Skull base and vertebrae: No acute fracture. No primary bone lesion or focal pathologic process. The C2-3 facets are fused bilaterally. Soft tissues and spinal canal: No prevertebral fluid or swelling. No visible canal hematoma. Disc levels: The spinal canal is quite generous. No large disc protrusions, significant spinal or foraminal stenosis. Air-filled bone cyst noted in the C7 vertebral body. Upper chest: The visualized lung apices are grossly clear. Other: No neck mass or adenopathy. IMPRESSION: 1. Mild age advanced cerebral atrophy, ventriculomegaly and periventricular white matter disease. 2. No acute intracranial findings or skull fracture. 3. Normal alignment of the cervical vertebral bodies and no acute cervical spine fracture. Electronically Signed   By: Rudie MeyerP.  Gallerani M.D.   On: 02/06/2020 14:52   CT Cervical Spine Wo Contrast  Result Date: 02/06/2020 CLINICAL DATA:  Larey SeatFell.  Hit head.  Altered mental status. EXAM: CT HEAD WITHOUT CONTRAST CT CERVICAL SPINE WITHOUT CONTRAST TECHNIQUE: Multidetector CT imaging of the head and cervical spine was performed following the standard protocol without intravenous contrast. Multiplanar CT image reconstructions of the cervical spine were also generated. COMPARISON:  Brain MRI 10/15/2010 FINDINGS: CT HEAD FINDINGS Brain: Mild age advanced cerebral atrophy, ventriculomegaly and periventricular white matter disease. No extra-axial fluid collections are identified. No CT findings for acute hemispheric infarction or intracranial hemorrhage. No mass lesions. The brainstem and cerebellum are normal. Vascular: Mild vascular calcifications but no aneurysm or hyperdense vessels. Skull: No acute skull  fracture or worrisome bone lesions. Sinuses/Orbits: The paranasal sinuses and mastoid air cells are clear. The globes are intact. Other: Small left frontal scalp hematoma but no foreign body or underlying fracture. CT CERVICAL SPINE FINDINGS Alignment: Normal. Skull base and vertebrae: No acute fracture. No primary bone lesion or focal pathologic process. The C2-3 facets are fused bilaterally. Soft tissues and spinal canal: No prevertebral fluid or swelling. No visible canal hematoma. Disc levels: The spinal canal is quite generous. No large disc protrusions, significant spinal or foraminal stenosis. Air-filled bone cyst noted in the C7 vertebral body. Upper chest: The visualized lung apices are grossly clear. Other: No neck mass or adenopathy. IMPRESSION: 1. Mild age advanced cerebral atrophy, ventriculomegaly and periventricular white matter disease. 2. No acute intracranial findings or skull fracture. 3. Normal alignment of the cervical vertebral bodies and no acute cervical spine fracture. Electronically Signed   By: Rudie MeyerP.  Gallerani M.D.   On: 02/06/2020 14:52    Procedures Procedures (including critical care time)  Medications Ordered in ED Medications  sertraline (ZOLOFT) tablet 25 mg (has no administration in time range)  lactated ringers infusion (has no administration in time range)  acetaminophen (TYLENOL) tablet 650 mg (has no administration in time range)    Or  acetaminophen (TYLENOL) suppository 650 mg (has no administration in time range)  oxyCODONE (Oxy IR/ROXICODONE) immediate release tablet 5 mg (has no administration in time range)  morphine 2 MG/ML injection 2 mg (has no administration in time range)  docusate sodium (COLACE) capsule 100 mg (has no administration in time range)  polyethylene glycol (MIRALAX / GLYCOLAX) packet 17 g (has no administration in time range)  bisacodyl (DULCOLAX) EC tablet 5 mg (has no administration in time range)  ondansetron (ZOFRAN) tablet 4 mg (has  no administration in time range)    Or  ondansetron (ZOFRAN) injection 4 mg (has no administration in time range)  sodium chloride 0.9 % bolus  1,000 mL (0 mLs Intravenous Stopped 02/06/20 1546)  fentaNYL (SUBLIMAZE) injection 50 mcg (50 mcg Intravenous Given 02/06/20 1548)  ondansetron (ZOFRAN) injection 4 mg (4 mg Intravenous Given 02/06/20 1548)    ED Course  I have reviewed the triage vital signs and the nursing notes.  Pertinent labs & imaging results that were available during my care of the patient were reviewed by me and considered in my medical decision making (see chart for details).    MDM Rules/Calculators/A&P                         LABRADFORD SCHNITKER is a 64 y.o. male with pertinent past medical history of severe dementia that presents to the emergency department today for abdominal pain.  Patient is severely demented, unable to obtain any history from him, however caretakers in the room is able to tell sory.  Patient's physical exam benign, normal GU exam.  Will obtain basic labs and CT imaging at this time.  CBC unremarkable.  Patient also fell this morning, caregiver most likely thinks this is from weaknes.  Small hematoma on forehead, CT imaging of head and neck without any acute finding.  CMP remarkable for creatinine of 2.26, this has increased from 1.79-month ago.  BUN also up, likely prerenal, did give 1 L of fluids at this time.  CT study shows two 7 mm stones with hydronephrosis.  Will consult hospitalist at this time due to AKI with ureteral stones.   328 Spoke to Dr. Berneice Heinrich, urology states that patient does not need emergent surgery, patient already has stent, but to admit to the hospitalist due to AKI and hydration.Urinalysis with hemoglobin, small leukocytosis, no nitrates.  Did speak to Dr. Berneice Heinrich about this he thinks is most likely from stent rather than infection.  410 spoke to hospitalist, Dr. Ophelia Charter hospitalist who will admit the patient.  The patient appears reasonably  stabilized for admission considering the current resources, flow, and capabilities available in the ED at this time, and I doubt any other [facility] requiring further screening and/or treatment in the ED prior to admission.  I discussed this case with my attending physician who cosigned this note including patient's presenting symptoms, physical exam, and planned diagnostics and interventions. Attending physician stated agreement with plan or made changes to plan which were implemented.     Final Clinical Impression(s) / ED Diagnoses Final diagnoses:  Ureteral stone with hydronephrosis  AKI (acute kidney injury) [facility])    Rx / DC Orders ED Discharge Orders    None       Farrel Gordon, PA-C 02/06/20 1733    Sabas Sous, MD 02/08/20 2259

## 2020-02-06 NOTE — ED Notes (Signed)
Attempted report to [id number], no answer will attempt again.

## 2020-02-06 NOTE — ED Notes (Signed)
Pt took 25 mg sertraline per caregiver at 0845.

## 2020-02-06 NOTE — H&P (Signed)
History and Physical    Gerald Diaz IOM:[id number] DOB: Nov 19, 1955 DOA: 02/06/2020  PCP: [name], MD Consultants:  [facility] [place] Patient coming from:  Home - lives with full-time caregivers; NOK: Eliot Ford, legal guardian, [phone]  Chief Complaint: Fall  HPI: Gerald Diaz is a 63 y.o. male with medical history significant of advanced dementia who was recently hospitalized from 9/2-6 with AKI and 9 mm stone in proximal left ureter in his solitary kidney; Dr. Alvester Morin was consulted and performed cystoscopy with IVP and stent placement.  He has seen urology in the interim (9/27) with plan for outpatient ureteroscopy with laser lithotripsy for stone removal; caregivers have not had further f/u about when this may be performed and are frustrated about this; he is scheduled to see urology again on Nov. 12. He has been complaining of his stomach feeling funny, not eating.  176 -> 147 pounds since last admission.  Complained of abdominal pain today - but midepigastric in nature, it appears.    This AM he fell at home and hit his head, progressive weakness.  No fever.    ED Course:  2 kidney stones, AKI, needs hydration.  Stent placed last month, but now 2 stones with hydronephrosis.  Dr. Berneice Heinrich will consult.  Needs hydration and pain control.  Review of Systems: As per HPI; otherwise review of systems reviewed and negative.   Ambulatory Status:  Ambulates without assistance  COVID Vaccine Status:  First shot - Moderna, second due on Oct 12  Past Medical History:  Diagnosis Date  . Anemia   . Dementia (HCC)   . Nephrolithiasis     Past Surgical History:  Procedure Laterality Date  . CYSTOSCOPY W/ URETERAL STENT PLACEMENT Left 01/05/2020   Procedure: CYSTOSCOPY WITH RETROGRADE PYELOGRAM/URETERAL STENT PLACEMENT;  Surgeon: Crista Elliot, MD;  Location: [facility];  Service: Urology;  Laterality: Left;  . NEPHRECTOMY     pt was 12    Social History   Socioeconomic  History  . Marital status: Married    Spouse name: Not on file  . Number of children: Not on file  . Years of education: Not on file  . Highest education level: Not on file  Occupational History  . Occupation: disabled  Tobacco Use  . Smoking status: Former Games developer  . Smokeless tobacco: Never Used  . Tobacco comment: history generally unknown  Substance and Sexual Activity  . Alcohol use: No  . Drug use: No  . Sexual activity: Not on file  Other Topics Concern  . Not on file  Social History Narrative  . Not on file   Social Determinants of Health   Financial Resource Strain:   . Difficulty of Paying Living Expenses: Not on file  Food Insecurity:   . Worried About Programme researcher, broadcasting/film/video in the Last Year: Not on file  . Ran Out of Food in the Last Year: Not on file  Transportation Needs:   . Lack of Transportation (Medical): Not on file  . Lack of Transportation (Non-Medical): Not on file  Physical Activity:   . Days of Exercise per Week: Not on file  . Minutes of Exercise per Session: Not on file  Stress:   . Feeling of Stress : Not on file  Social Connections:   . Frequency of Communication with Friends and Family: Not on file  . Frequency of Social Gatherings with Friends and Family: Not on file  . Attends Religious Services: Not on  file  . Active Member of Clubs or Organizations: Not on file  . Attends BankerClub or Organization Meetings: Not on file  . Marital Status: Not on file  Intimate Partner Violence:   . Fear of Current or Ex-Partner: Not on file  . Emotionally Abused: Not on file  . Physically Abused: Not on file  . Sexually Abused: Not on file    No Known Allergies  Family History  Family history unknown: Yes    Prior to Admission medications   Medication Sig Start Date End Date Taking? Authorizing Provider  sertraline (ZOLOFT) 25 MG tablet Take 25 mg by mouth daily. 01/01/20  Yes [provider]    Physical Exam: Vitals:   02/06/20 1445  02/06/20 1500 02/06/20 1552 02/06/20 1630  BP:  115/78 122/65 120/68  Pulse: 82 100    Resp: 13 15 18 18   Temp:      TempSrc:      SpO2: 100%        . General:  Appears calm and comfortable and is NAD; unable to answer questions but alert and conversant . Eyes:  PERRL, EOMI, normal lids, iris . ENT:  grossly normal hearing, dry lips & tongue, dry mm . Neck:  no LAD, masses or thyromegaly . Cardiovascular:  RRR, no m/r/g. No LE edema.  Marland Kitchen. Respiratory:   CTA bilaterally with no wheezes/rales/rhonchi.  Normal respiratory effort. . Abdomen:  soft, NT, ND, NABS - reports pain along mid-epigastric region but without apparent TTP . Back:   normal alignment, no CVAT . Skin:  Mild maculopapular rash along left diaphragm . Musculoskeletal:  grossly normal tone BUE/BLE, good ROM, no bony abnormality . Psychiatric:  eccentric mood and affect, speech fluent but inappropriate . Neurologic:  CN 2-12 grossly intact, moves all extremities in coordinated fashion    Radiological Exams on Admission: CT Abdomen Pelvis Wo Contrast  Result Date: 02/06/2020 CLINICAL DATA:  Flank pain.  Confusion. EXAM: CT ABDOMEN AND PELVIS WITHOUT CONTRAST TECHNIQUE: Multidetector CT imaging of the abdomen and pelvis was performed following the standard protocol without IV contrast. COMPARISON:  CT scan 01/04/2020 FINDINGS: Lower chest: The lung bases are clear of acute process. No pleural effusion or pulmonary lesions. The heart is normal in size. No pericardial effusion. The distal esophagus and aorta are unremarkable. Hepatobiliary: No hepatic lesions are identified without contrast. The gallbladder appears normal. No common bile duct dilatation. Pancreas: No mass, inflammation or ductal dilatation. Spleen: Normal size.  No focal lesions. Adrenals/Urinary Tract: The adrenal glands are unremarkable. The right kidney is surgically absent. The left kidney contains a double-J ureteral stent. Mild hydroureteronephrosis noted.  There are 2 adjacent calculi in the left mid ureter at the L4 level. Both calculi measure approximately 7 mm in length. I do not see any distal ureteral calculi. There are small layering calculi in the bladder and a larger calculus in the left ladder near the loop of the double-J stent. This measures 5.5 mm. Stomach/Bowel: The stomach, duodenum, small bowel and colon are grossly normal without oral contrast. No inflammatory changes, mass lesions or obstructive findings. The appendix is normal. Vascular/Lymphatic: Stable scattered vascular calcifications. No abdominal or pelvic lymphadenopathy. Reproductive: Mild prostate gland enlargement with median lobe hypertrophy impressing on the base of the bladder. The seminal vesicles are unremarkable. Other: No pelvic mass or adenopathy. No free pelvic fluid collections. No inguinal mass or adenopathy. No abdominal wall hernia or subcutaneous lesions. Musculoskeletal: No significant bony findings. IMPRESSION: 1. Two adjacent calculi  in the left mid ureter at the L4 level. Both calculi measure approximately 7 mm in length. 2. Mild left-sided hydroureteronephrosis. 3. Status post right nephrectomy. 4. Stable bladder calculi. 5. No other significant abdominal/pelvic findings and no mass lesions or adenopathy. 6. Aortic atherosclerosis. Aortic Atherosclerosis (ICD10-I70.0). Electronically Signed   By: Rudie Meyer M.D.   On: 02/06/2020 15:00   CT Head Wo Contrast  Result Date: 02/06/2020 CLINICAL DATA:  Larey Seat.  Hit head.  Altered mental status. EXAM: CT HEAD WITHOUT CONTRAST CT CERVICAL SPINE WITHOUT CONTRAST TECHNIQUE: Multidetector CT imaging of the head and cervical spine was performed following the standard protocol without intravenous contrast. Multiplanar CT image reconstructions of the cervical spine were also generated. COMPARISON:  Brain MRI 10/15/2010 FINDINGS: CT HEAD FINDINGS Brain: Mild age advanced cerebral atrophy, ventriculomegaly and periventricular white  matter disease. No extra-axial fluid collections are identified. No CT findings for acute hemispheric infarction or intracranial hemorrhage. No mass lesions. The brainstem and cerebellum are normal. Vascular: Mild vascular calcifications but no aneurysm or hyperdense vessels. Skull: No acute skull fracture or worrisome bone lesions. Sinuses/Orbits: The paranasal sinuses and mastoid air cells are clear. The globes are intact. Other: Small left frontal scalp hematoma but no foreign body or underlying fracture. CT CERVICAL SPINE FINDINGS Alignment: Normal. Skull base and vertebrae: No acute fracture. No primary bone lesion or focal pathologic process. The C2-3 facets are fused bilaterally. Soft tissues and spinal canal: No prevertebral fluid or swelling. No visible canal hematoma. Disc levels: The spinal canal is quite generous. No large disc protrusions, significant spinal or foraminal stenosis. Air-filled bone cyst noted in the C7 vertebral body. Upper chest: The visualized lung apices are grossly clear. Other: No neck mass or adenopathy. IMPRESSION: 1. Mild age advanced cerebral atrophy, ventriculomegaly and periventricular white matter disease. 2. No acute intracranial findings or skull fracture. 3. Normal alignment of the cervical vertebral bodies and no acute cervical spine fracture. Electronically Signed   By: Rudie Meyer M.D.   On: 02/06/2020 14:52   CT Cervical Spine Wo Contrast  Result Date: 02/06/2020 CLINICAL DATA:  Larey Seat.  Hit head.  Altered mental status. EXAM: CT HEAD WITHOUT CONTRAST CT CERVICAL SPINE WITHOUT CONTRAST TECHNIQUE: Multidetector CT imaging of the head and cervical spine was performed following the standard protocol without intravenous contrast. Multiplanar CT image reconstructions of the cervical spine were also generated. COMPARISON:  Brain MRI 10/15/2010 FINDINGS: CT HEAD FINDINGS Brain: Mild age advanced cerebral atrophy, ventriculomegaly and periventricular white matter disease.  No extra-axial fluid collections are identified. No CT findings for acute hemispheric infarction or intracranial hemorrhage. No mass lesions. The brainstem and cerebellum are normal. Vascular: Mild vascular calcifications but no aneurysm or hyperdense vessels. Skull: No acute skull fracture or worrisome bone lesions. Sinuses/Orbits: The paranasal sinuses and mastoid air cells are clear. The globes are intact. Other: Small left frontal scalp hematoma but no foreign body or underlying fracture. CT CERVICAL SPINE FINDINGS Alignment: Normal. Skull base and vertebrae: No acute fracture. No primary bone lesion or focal pathologic process. The C2-3 facets are fused bilaterally. Soft tissues and spinal canal: No prevertebral fluid or swelling. No visible canal hematoma. Disc levels: The spinal canal is quite generous. No large disc protrusions, significant spinal or foraminal stenosis. Air-filled bone cyst noted in the C7 vertebral body. Upper chest: The visualized lung apices are grossly clear. Other: No neck mass or adenopathy. IMPRESSION: 1. Mild age advanced cerebral atrophy, ventriculomegaly and periventricular white matter disease. 2. No acute  intracranial findings or skull fracture. 3. Normal alignment of the cervical vertebral bodies and no acute cervical spine fracture. Electronically Signed   By: Rudie Meyer M.D.   On: 02/06/2020 14:52    EKG: Independently reviewed.  Sinus arrhythmia with rate 82; no evidence of acute ischemia   Labs on Admission: I have personally reviewed the available labs and imaging studies at the time of the admission.  Pertinent labs:   Glucose 120 BUN 27/Creatinine 2.26/GFR 29; 52/1.73/40 on 9/5 - improved from 15.31 on 9/2; normal in 08/2019 WBC 10.8 UA: 50 glucose, large Hgb, 20 ketones, small LE, 100 protein, no bacteria, >50 WBC/RBC   Assessment/Plan Principal Problem:   Ureteral stone with hydronephrosis Active Problems:   Dementia (HCC)   AKI (acute kidney  injury) (HCC)   S/p nephrectomy   Ureteral stone with hydronephrosis -Patient with h/o R nephrectomy -9/2 CT with 9mm stone in the proximal L ureter -Cystoscopy was performed on 9/3 with L retrograde pyelogram and L ureteral stent placement -He followed up with [facility] on 9/27 and plan was made for ureteroscopy with laser lithotripsy but this was not scheduled and he is not due to recheck until Nov. 2 -Meanwhile, imaging today shows 2 x 7 mm calculi in the left mid ureter with mild left-sided hydroureteronephrosis -There is no other apparent finding in the pelvis and so this may be associated with his persistent weight loss, cachexia, and c/o abdominal pain -Urology consult is pending -No clear evidence of infection on UA -Urine culture ordered -It appears that he will again require intervention while inpatient -Will hydrate and provide pain control in the interim -Consider addition of Flomax  AKI -Diminished PO intake  -Previously had marked renal failure with creatinine as high as 15 -It appeared to level at 1.73 (may be his new baseline) but currently 2.26 -Will hydrate and follow  Dementia -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, never married -A neighbor noticed onset of dementia maybe 18 months ago -He reached out to a guardian agency and they assumed care with 24/7 caregivers in February -Prior history is limited -His only home medication is Zoloft  Possible midepigastric pain -Because of patient's dementia, it is difficult to determine the accuracy of his history and location of his pain -He reports midepigastric pain but does not have current TTP in that region (or elsewhere) -He does have a mild rash along the left diaphragmatic region - this may be associated with the electrodes and/or adhesive but should be monitored for developed of vesicles since it is dermatomal and early shingles could present with this  presentation  Unintentional weight loss -HHN reports 30 pounds of weight loss since last hospitalization with persistent anorexia -Nutrition consult ordered    Note: This patient has been tested and is negative for the novel coronavirus COVID-19. The patient has been partially vaccinated against COVID-19.    DVT prophylaxis:  SCDs Code Status:  Full - confirmed with guardian Family Communication: Caregivers were present throughout evaluation Disposition Plan:  The patient is from: home  Anticipated d/c is to: home without [facility] services   Anticipated d/c date will depend on clinical response to treatment, but will likely need surgical intervention prior to d/c  Patient is currently: acutely ill Consults called: Urology; Nutrition Admission status:  Admit - It is my clinical opinion that admission to INPATIENT is reasonable and necessary because of the expectation that this patient will  require hospital care that crosses at least 2 midnights to treat this condition based on the medical complexity of the problems presented.  Given the aforementioned information, the predictability of an adverse outcome is felt to be significant.    Jonah Blue MD [facility]   How to contact the [facility] Attending or Consulting provider 7A - 7P or covering provider during after hours 7P -7A, for this patient?  1. Check the care team in [facility] and look for a) attending/consulting [facility] provider listed and b) the [facility] team listed 2. Log into [email or web address] and use 's universal password to access. If you do not have the password, please contact the hospital operator. 3. Locate the [facility] provider you are looking for under [facility] and page to a number that you can be directly reached. 4. If you still have difficulty reaching the provider, please page the [facility] (Director on Call) for the Hospitalists listed on amion for assistance.   02/06/2020, 5:27 PM

## 2020-02-07 DIAGNOSIS — N132 Hydronephrosis with renal and ureteral calculous obstruction: Principal | ICD-10-CM

## 2020-02-07 LAB — BASIC METABOLIC PANEL
Anion gap: 12 (ref 5–15)
BUN: 25 mg/dL — ABNORMAL HIGH (ref 8–23)
CO2: 23 mmol/L (ref 22–32)
Calcium: 8.7 mg/dL — ABNORMAL LOW (ref 8.9–10.3)
Chloride: 107 mmol/L (ref 98–111)
Creatinine, Ser: 1.91 mg/dL — ABNORMAL HIGH (ref 0.61–1.24)
GFR calc non Af Amer: 36 mL/min — ABNORMAL LOW (ref >60)
Glucose, Bld: 103 mg/dL — ABNORMAL HIGH (ref 70–99)
Potassium: 3.6 mmol/L (ref 3.5–5.1)
Sodium: 142 mmol/L (ref 135–145)

## 2020-02-07 LAB — CBC
HCT: 38.2 % — ABNORMAL LOW (ref 39.0–52.0)
Hemoglobin: 12.4 g/dL — ABNORMAL LOW (ref 13.0–17.0)
MCH: 31.7 pg (ref 26.0–34.0)
MCHC: 32.5 g/dL (ref 30.0–36.0)
MCV: 97.7 fL (ref 80.0–100.0)
Platelets: 192 10*3/uL (ref 150–400)
RBC: 3.91 MIL/uL — ABNORMAL LOW (ref 4.22–5.81)
RDW: 12 % (ref 11.5–15.5)
WBC: 9.6 10*3/uL (ref 4.0–10.5)
nRBC: 0 % (ref 0.0–0.2)

## 2020-02-07 MED ORDER — ADULT MULTIVITAMIN W/MINERALS CH
1.0000 | ORAL_TABLET | Freq: Every day | ORAL | Status: DC
  Administered 2020-02-07 – 2020-02-10 (×4): 1 via ORAL
  Filled 2020-02-07 (×4): qty 1

## 2020-02-07 MED ORDER — ENSURE ENLIVE PO LIQD
237.0000 mL | Freq: Two times a day (BID) | ORAL | Status: DC
  Administered 2020-02-07 – 2020-02-10 (×4): 237 mL via ORAL

## 2020-02-07 MED ORDER — HALOPERIDOL LACTATE 5 MG/ML IJ SOLN
2.0000 mg | Freq: Four times a day (QID) | INTRAMUSCULAR | Status: AC | PRN
  Administered 2020-02-07: 2 mg via INTRAVENOUS
  Filled 2020-02-07: qty 1

## 2020-02-07 NOTE — Progress Notes (Signed)
°   02/06/20 2316  Vitals  Temp 98.4 F (36.9 C)  Temp Source Oral  BP 111/72  MAP (mmHg) 84  BP Location Right Arm  BP Method Automatic  Patient Position (if appropriate) Sitting  Pulse Rate 72  Resp 17  Level of Consciousness  Level of Consciousness Alert  MEWS COLOR  MEWS Score Color Green  Oxygen Therapy  SpO2 98 %  O2 Device Room Air  Pain Assessment  Pain Scale 0-10  Pain Score 0  Complaints & Interventions  Neuro symptoms relieved by Rest  MEWS Score  MEWS Temp 0  MEWS Systolic 0  MEWS Pulse 0  MEWS RR 0  MEWS LOC 0  MEWS Score 0  Received patient alert, oriented to self with hx of advanced dementia, patient with caregiver at bedside. Vital signs stable shown above. Oriented to room and bed controls, situated in bed with call bell at reach. Placed on a bed alarm and floor mats in place.

## 2020-02-07 NOTE — Progress Notes (Addendum)
PROGRESS NOTE    Gerald Diaz  ACZ:[id number] DOB: February 22, 1956 DOA: 02/06/2020 PCP: [name], MD  Brief Narrative:Gerald Diaz is a 64 y.o. male with medical history significant of advanced dementia who was recently hospitalized from 9/2-6 with AKI and 9 mm stone in proximal left ureter in his solitary kidney; Dr. Alvester Morin was consulted and performed cystoscopy with IVP and stent placement.  He has seen urology in the interim (9/27) with plan for outpatient ureteroscopy with laser lithotripsy for stone removal; caregivers have not had further f/u about when this may be performed and are frustrated about this; he is scheduled to see urology again on Nov. 12. He has been complaining of his stomach feeling funny, not eating.  176 -> 147 pounds since last admission.  Complained of abdominal pain today - but midepigastric in nature, it appears.    This AM he fell at home and hit his head, progressive weakness.  No fever. In the ED it was noted patient was in AKI and has new hydronephrosis with new  stones urology consulted.   Assessment & Plan:   Principal Problem:   Ureteral stone with hydronephrosis Active Problems:   Dementia (HCC)   AKI (acute kidney injury) (HCC)   S/p nephrectomy   #1 left hydronephrosis with new ureteral stones in patient with history of right nephrectomy.  Seen by urology plan for ureteroscopy during this hospital stay. Patient had CT abdomen 01/14/2020 with 9 mm stone in the proximal left ureter followed by which she had a cystoscopy on 01/05/2020 and left ureteral stent placement by Dr. Alvester Morin. He followed with [facility] on 01/29/2020 and was planning to have ureteroscopy with laser lithotripsy.  Meanwhile he came to [facility] and now with new left mid ureter stone with hydroureteronephrosis.  Patient had persistent abdominal pain weight loss cachexia and falls prior to admission.  #2 AKI-on admission his creatinine was 2.26 which is down to 1.9 on IV fluids recheck labs in  a.m.  #3 history of dementia patient lives at home with 24-hour care.  Continue Zoloft.   Nutrition Problem: Inadequate oral intake Etiology: poor appetite (abdominal pain)     Signs/Symptoms: per patient/family report, energy intake < 75% for > or equal to 1 month    Interventions: Ensure Enlive (each supplement provides 350kcal and 20 grams of protein), Magic cup, MVI  Estimated body mass index is 22.75 kg/m as calculated from the following:   Height as of 06/17/16: 6' (1.829 m).   Weight as of 01/05/20: 76.1 kg.  DVT prophylaxis: SCD  code Status: Full code  family Communication: Discussed with caregiver at bedside  disposition Plan:  Status is: Inpatient  Dispo: The patient is from: Home              Anticipated d/c is to: Home              Anticipated d/c date is: > 3 days              Patient currently is not medically stable to d/c.  Patient with new left hydronephrosis awaiting surgery associated with AKI on IV fluids   Consultants:   Urology  Procedures: None Antimicrobials: None  Subjective: Patient sitting up by the side of the bed sitter by the bedside  Objective: Vitals:   02/06/20 2000 02/06/20 2134 02/06/20 2316 02/07/20 1058  BP: (!) 117/92 119/81 111/72 124/63  Pulse: 76 78 72 62  Resp:  18 17 17   Temp:  98.4 F (36.9 C) 97.7 F (36.5 C)  TempSrc:   Oral Oral  SpO2: 100% 100% 98% 99%    Intake/Output Summary (Last 24 hours) at 02/07/2020 1220 Last data filed at 02/07/2020 0300 Gross per 24 hour  Intake 2400.25 ml  Output --  Net 2400.25 ml   There were no vitals filed for this visit.  Examination:  General exam: Appears calm and comfortable  Respiratory system: Clear to auscultation. Respiratory effort normal. Cardiovascular system: S1 & S2 heard, RRR. No JVD, murmurs, rubs, gallops or clicks. No pedal edema. Gastrointestinal system: Abdomen is nondistended, soft and nontender. No organomegaly or masses felt. Normal bowel sounds  heard. Central nervous system: Alert and oriented. No focal neurological deficits. Extremities: Symmetric 5 x 5 power. Skin: No rashes, lesions or ulcers Psychiatry: Judgement and insight appear normal. Mood & affect appropriate.     Data Reviewed: I have personally reviewed following labs and imaging studies  CBC: Recent Labs  Lab 02/06/20 1001 02/07/20 0229  WBC 10.8* 9.6  HGB 15.4 12.4*  HCT 46.0 38.2*  MCV 97.5 97.7  PLT 234 192   Basic Metabolic Panel: Recent Labs  Lab 02/06/20 1001 02/07/20 0229  NA 144 142  K 4.2 3.6  CL 105 107  CO2 24 23  GLUCOSE 120* 103*  BUN 27* 25*  CREATININE 2.26* 1.91*  CALCIUM 9.7 8.7*   GFR: CrCl cannot be calculated (Unknown ideal weight.). Liver Function Tests: Recent Labs  Lab 02/06/20 1001  AST 20  ALT 19  ALKPHOS 79  BILITOT 1.1  PROT 6.9  ALBUMIN 3.8   Recent Labs  Lab 02/06/20 1001  LIPASE 29   No results for input(s): AMMONIA in the last 168 hours. Coagulation Profile: No results for input(s): INR, PROTIME in the last 168 hours. Cardiac Enzymes: No results for input(s): CKTOTAL, CKMB, CKMBINDEX, TROPONINI in the last 168 hours. BNP (last 3 results) No results for input(s): PROBNP in the last 8760 hours. HbA1C: No results for input(s): HGBA1C in the last 72 hours. CBG: No results for input(s): GLUCAP in the last 168 hours. Lipid Profile: No results for input(s): CHOL, HDL, LDLCALC, TRIG, CHOLHDL, LDLDIRECT in the last 72 hours. Thyroid Function Tests: No results for input(s): TSH, T4TOTAL, FREET4, T3FREE, THYROIDAB in the last 72 hours. Anemia Panel: No results for input(s): VITAMINB12, FOLATE, FERRITIN, TIBC, IRON, RETICCTPCT in the last 72 hours. Sepsis Labs: No results for input(s): PROCALCITON, LATICACIDVEN in the last 168 hours.  Recent Results (from the past 240 hour(s))  Respiratory Panel by RT PCR (Flu A&B, Covid) - Nasopharyngeal Swab     Status: None   Collection Time: 02/06/20  7:28 PM    Specimen: Nasopharyngeal Swab  Result Value Ref Range Status   SARS Coronavirus 2 by RT PCR NEGATIVE NEGATIVE Final    Comment: (NOTE) SARS-CoV-2 target nucleic acids are NOT DETECTED.  The SARS-CoV-2 RNA is generally detectable in upper respiratoy specimens during the acute phase of infection. The lowest concentration of SARS-CoV-2 viral copies this assay can detect is 131 copies/mL. A negative result does not preclude SARS-Cov-2 infection and should not be used as the sole basis for treatment or other patient management decisions. A negative result may occur with  improper specimen collection/handling, submission of specimen other than nasopharyngeal swab, presence of viral mutation(s) within the areas targeted by this assay, and inadequate number of viral copies (<131 copies/mL). A negative result must be combined with clinical observations, patient history, and epidemiological information. The  expected result is Negative.  Fact Sheet for Patients:  https://www.moore.com/  Fact Sheet for Healthcare Providers:  https://www.young.biz/  This test is no t yet approved or cleared by the [place] FDA and  has been authorized for detection and/or diagnosis of SARS-CoV-2 by FDA under an Emergency Use Authorization (EUA). This EUA will remain  in effect (meaning this test can be used) for the duration of the COVID-19 declaration under Section 564(b)(1) of the Act, 21 U.S.C. section 360bbb-3(b)(1), unless the authorization is terminated or revoked sooner.     Influenza A by PCR NEGATIVE NEGATIVE Final   Influenza B by PCR NEGATIVE NEGATIVE Final    Comment: (NOTE) The Xpert Xpress SARS-CoV-2/FLU/RSV assay is intended as an aid in  the diagnosis of influenza from Nasopharyngeal swab specimens and  should not be used as a sole basis for treatment. Nasal washings and  aspirates are unacceptable for Xpert Xpress SARS-CoV-2/FLU/RSV   testing.  Fact Sheet for Patients: https://www.moore.com/  Fact Sheet for Healthcare Providers: https://www.young.biz/  This test is not yet approved or cleared by the [place] FDA and  has been authorized for detection and/or diagnosis of SARS-CoV-2 by  FDA under an Emergency Use Authorization (EUA). This EUA will remain  in effect (meaning this test can be used) for the duration of the  Covid-19 declaration under Section 564(b)(1) of the Act, 21  U.S.C. section 360bbb-3(b)(1), unless the authorization is  terminated or revoked. Performed at [facility], [address] [place], [place] [address]          Radiology Studies: CT Abdomen Pelvis Wo Contrast  Result Date: 02/06/2020 CLINICAL DATA:  Flank pain.  Confusion. EXAM: CT ABDOMEN AND PELVIS WITHOUT CONTRAST TECHNIQUE: Multidetector CT imaging of the abdomen and pelvis was performed following the standard protocol without IV contrast. COMPARISON:  CT scan 01/04/2020 FINDINGS: Lower chest: The lung bases are clear of acute process. No pleural effusion or pulmonary lesions. The heart is normal in size. No pericardial effusion. The distal esophagus and aorta are unremarkable. Hepatobiliary: No hepatic lesions are identified without contrast. The gallbladder appears normal. No common bile duct dilatation. Pancreas: No mass, inflammation or ductal dilatation. Spleen: Normal size.  No focal lesions. Adrenals/Urinary Tract: The adrenal glands are unremarkable. The right kidney is surgically absent. The left kidney contains a double-J ureteral stent. Mild hydroureteronephrosis noted. There are 2 adjacent calculi in the left mid ureter at the L4 level. Both calculi measure approximately 7 mm in length. I do not see any distal ureteral calculi. There are small layering calculi in the bladder and a larger calculus in the left ladder near the loop of the double-J stent. This measures 5.5 mm.  Stomach/Bowel: The stomach, duodenum, small bowel and colon are grossly normal without oral contrast. No inflammatory changes, mass lesions or obstructive findings. The appendix is normal. Vascular/Lymphatic: Stable scattered vascular calcifications. No abdominal or pelvic lymphadenopathy. Reproductive: Mild prostate gland enlargement with median lobe hypertrophy impressing on the base of the bladder. The seminal vesicles are unremarkable. Other: No pelvic mass or adenopathy. No free pelvic fluid collections. No inguinal mass or adenopathy. No abdominal wall hernia or subcutaneous lesions. Musculoskeletal: No significant bony findings. IMPRESSION: 1. Two adjacent calculi in the left mid ureter at the L4 level. Both calculi measure approximately 7 mm in length. 2. Mild left-sided hydroureteronephrosis. 3. Status post right nephrectomy. 4. Stable bladder calculi. 5. No other significant abdominal/pelvic findings and no mass lesions or adenopathy. 6. Aortic atherosclerosis.  Aortic Atherosclerosis (ICD10-I70.0). Electronically Signed   By: Rudie Meyer M.D.   On: 02/06/2020 15:00   CT Head Wo Contrast  Result Date: 02/06/2020 CLINICAL DATA:  Larey Seat.  Hit head.  Altered mental status. EXAM: CT HEAD WITHOUT CONTRAST CT CERVICAL SPINE WITHOUT CONTRAST TECHNIQUE: Multidetector CT imaging of the head and cervical spine was performed following the standard protocol without intravenous contrast. Multiplanar CT image reconstructions of the cervical spine were also generated. COMPARISON:  Brain MRI 10/15/2010 FINDINGS: CT HEAD FINDINGS Brain: Mild age advanced cerebral atrophy, ventriculomegaly and periventricular white matter disease. No extra-axial fluid collections are identified. No CT findings for acute hemispheric infarction or intracranial hemorrhage. No mass lesions. The brainstem and cerebellum are normal. Vascular: Mild vascular calcifications but no aneurysm or hyperdense vessels. Skull: No acute skull fracture or  worrisome bone lesions. Sinuses/Orbits: The paranasal sinuses and mastoid air cells are clear. The globes are intact. Other: Small left frontal scalp hematoma but no foreign body or underlying fracture. CT CERVICAL SPINE FINDINGS Alignment: Normal. Skull base and vertebrae: No acute fracture. No primary bone lesion or focal pathologic process. The C2-3 facets are fused bilaterally. Soft tissues and spinal canal: No prevertebral fluid or swelling. No visible canal hematoma. Disc levels: The spinal canal is quite generous. No large disc protrusions, significant spinal or foraminal stenosis. Air-filled bone cyst noted in the C7 vertebral body. Upper chest: The visualized lung apices are grossly clear. Other: No neck mass or adenopathy. IMPRESSION: 1. Mild age advanced cerebral atrophy, ventriculomegaly and periventricular white matter disease. 2. No acute intracranial findings or skull fracture. 3. Normal alignment of the cervical vertebral bodies and no acute cervical spine fracture. Electronically Signed   By: Rudie Meyer M.D.   On: 02/06/2020 14:52   CT Cervical Spine Wo Contrast  Result Date: 02/06/2020 CLINICAL DATA:  Larey Seat.  Hit head.  Altered mental status. EXAM: CT HEAD WITHOUT CONTRAST CT CERVICAL SPINE WITHOUT CONTRAST TECHNIQUE: Multidetector CT imaging of the head and cervical spine was performed following the standard protocol without intravenous contrast. Multiplanar CT image reconstructions of the cervical spine were also generated. COMPARISON:  Brain MRI 10/15/2010 FINDINGS: CT HEAD FINDINGS Brain: Mild age advanced cerebral atrophy, ventriculomegaly and periventricular white matter disease. No extra-axial fluid collections are identified. No CT findings for acute hemispheric infarction or intracranial hemorrhage. No mass lesions. The brainstem and cerebellum are normal. Vascular: Mild vascular calcifications but no aneurysm or hyperdense vessels. Skull: No acute skull fracture or worrisome bone  lesions. Sinuses/Orbits: The paranasal sinuses and mastoid air cells are clear. The globes are intact. Other: Small left frontal scalp hematoma but no foreign body or underlying fracture. CT CERVICAL SPINE FINDINGS Alignment: Normal. Skull base and vertebrae: No acute fracture. No primary bone lesion or focal pathologic process. The C2-3 facets are fused bilaterally. Soft tissues and spinal canal: No prevertebral fluid or swelling. No visible canal hematoma. Disc levels: The spinal canal is quite generous. No large disc protrusions, significant spinal or foraminal stenosis. Air-filled bone cyst noted in the C7 vertebral body. Upper chest: The visualized lung apices are grossly clear. Other: No neck mass or adenopathy. IMPRESSION: 1. Mild age advanced cerebral atrophy, ventriculomegaly and periventricular white matter disease. 2. No acute intracranial findings or skull fracture. 3. Normal alignment of the cervical vertebral bodies and no acute cervical spine fracture. Electronically Signed   By: Rudie Meyer M.D.   On: 02/06/2020 14:52        Scheduled Meds: . docusate sodium  100 mg Oral BID  . feeding supplement (ENSURE ENLIVE)  237 mL Oral BID BM  . fluconazole  100 mg Oral Daily  . multivitamin with minerals  1 tablet Oral Daily  . sertraline  25 mg Oral Daily   Continuous Infusions: . lactated ringers 75 mL/hr at 02/07/20 0715     LOS: 1 day     Alwyn RenElizabeth G Cisco Kindt, MD  02/07/2020, 12:20 PM

## 2020-02-07 NOTE — Progress Notes (Signed)
Initial Nutrition Assessment  DOCUMENTATION CODES:   Not applicable  INTERVENTION:  Ensure Enlive po BID, each supplement provides 350 kcal and 20 grams of protein (chocolate)  Magic cup BID with meals, each supplement provides 290 kcal and 9 grams of protein  MVI with minerals daily  Daily weights  Food preferences updated in [facility]   Pt at risk for refeeding given very poor po intake x 1 month per guardian, recommend monitoring K, Mg, and P daily as po intake improves, MD to replete as needed.   NUTRITION DIAGNOSIS:   Inadequate oral intake related to poor appetite (abdominal pain) as evidenced by per patient/family report, energy intake < 75% for > or equal to 1 month.    GOAL:   Patient will meet greater than or equal to 90% of their needs    MONITOR:   PO intake, Supplement acceptance, Weight trends, Labs, I & O's  REASON FOR ASSESSMENT:   Consult Other (Comment) (unintentional wt loss)  ASSESSMENT:  RD working remotely.  64 year old male with history significant of advanced dementia who was recently hospitalized 09/02-09/06 with AKI and left ureteral stone s/p cystoscopy with IVP and stent presented with abdominal pain, progressive weakness, poor po intake, and wt loss.  Patient admitted for ureteral stone with hydronephrosis  RD consulted for unintentional wt loss.  Pt noted with severe dementia, RD able to speak with pt guardian Gerald Diaz) via phone this morning. She reports pt has a good appetite at baseline, states that he "loves Mindi Slicker and would eat 2 Whoppers daily" She reports pt has been complaining of abdominal pain since returning home from recent hospital admission and hasn't been eating, recalls mostly drinking Pepsi, water, and chocolate Ensure. He is on a regular diet, no documented intakes at this time. Suspect ongoing poor po, will order Ensure and Magic Cup supplements to aid with meeting needs as well as daily  MVI.  Guardian recalls usual wt ~190 lbs a year ago when she began working with him and endorsed ~25 lb wt loss in the last month. She states that pt is weighed daily at home, recalls 165 lbs a few days ago. Per chart review, pt weighed 79.4 kg (174.68 lb) on 01/29/20 at [facility], 76.1 kg (167.42 lb) on 01/05/20 hospital admit, 79.4 kg (174.68 lb) on 11/10/19 and on 09/01/19 at [facility]. Question if [facility] weights were stated or taken from chart given they are the same x 3.  Pt has not been weighed this admission.. Will order daily weights and plan to complete NFPE at follow-up.    Medications reviewed and include: Colace, Diflucan, Zoloft Lactated ringers @ 75 ml/hr  Labs: BUN 25 (H), Cr 1.91 (H)  NUTRITION - FOCUSED PHYSICAL EXAM: Unable to complete at this time, RD working remotely.  Diet Order:   Diet Order            Diet regular Room service appropriate? Yes; Fluid consistency: Thin  Diet effective now                 EDUCATION NEEDS:   No education needs have been identified at this time  Skin:  Skin Assessment: Reviewed RN Assessment  Last BM:  pta  Height:   Ht Readings from Last 1 Encounters:  06/17/16 6' (1.829 m)    Weight:   Wt Readings from Last 1 Encounters:  01/05/20 76.1 kg    BMI:  There is no height or weight on file  to calculate BMI.  Estimated Nutritional Needs:   Kcal:  2100-2300  Protein:  105-115  Fluid:  >/= 2.1 L   Gerald Diaz, RD, LDN Clinical Nutrition After Hours/Weekend Pager # in Amion

## 2020-02-08 ENCOUNTER — Other Ambulatory Visit: Payer: Self-pay | Admitting: Urology

## 2020-02-08 LAB — BASIC METABOLIC PANEL
Anion gap: 10 (ref 5–15)
BUN: 18 mg/dL (ref 8–23)
CO2: 26 mmol/L (ref 22–32)
Calcium: 9 mg/dL (ref 8.9–10.3)
Chloride: 106 mmol/L (ref 98–111)
Creatinine, Ser: 1.65 mg/dL — ABNORMAL HIGH (ref 0.61–1.24)
GFR calc non Af Amer: 43 mL/min — ABNORMAL LOW (ref >60)
Glucose, Bld: 121 mg/dL — ABNORMAL HIGH (ref 70–99)
Potassium: 3.5 mmol/L (ref 3.5–5.1)
Sodium: 142 mmol/L (ref 135–145)

## 2020-02-08 LAB — URINE CULTURE: Culture: 40000 — AB

## 2020-02-08 MED ORDER — SODIUM CHLORIDE 0.9 % IV SOLN
1.0000 g | INTRAVENOUS | Status: DC
  Filled 2020-02-08: qty 10

## 2020-02-08 NOTE — Social Work (Signed)
Note Guardianship paperwork. CSW familiar with Eliot Ford at [employer] for [employer] from previous pts. CSW called and left HIPAA compliant message at [phone].   Octavio Graves, MSW, LCSW [facility] Clinical Social Work

## 2020-02-08 NOTE — Progress Notes (Signed)
[facility] business office in contact with Rosann Auerbach today and able to get emergency authorization for kidney stone removal despite being out of network. Discussed with pt's guardian Eliot Ford who wants [place] to proceed tomorrow with ureteroscopy, cystolithalopexy, stent exchange with goal of stone free. Risks, benefits, alternatives, expected peri-op course discussed by phone and guardian expressed understanding and desire to proceed.

## 2020-02-08 NOTE — Plan of Care (Signed)
  Problem: Clinical Measurements: Goal: Will remain free from infection Outcome: Progressing   Problem: Education: Goal: Knowledge of General Education information will improve Description: Including pain rating scale, medication(s)/side effects and non-pharmacologic comfort measures Outcome: Not Progressing  Patient afebrile this shift, patient needing extensive cueing to complete adl tasks, unable to complete patient education.  Problem: Nutrition: Goal: Adequate nutrition will be maintained Outcome: Not Progressing  Patient with poor intakes, zofran given as caregivers expressed concern patient may have nausea from UTI/antibiotics.  Problem: Activity: Goal: Risk for activity intolerance will decrease Outcome: Progressing  Patient ambulating in hallway with standby assist, no dyspnea observed.

## 2020-02-08 NOTE — Progress Notes (Signed)
PROGRESS NOTE    Gerald Diaz  WUJ:[id number] DOB: 1955-10-02 DOA: 02/06/2020 PCP: [name], MD  Brief Narrative:Gerald Diaz is a 64 y.o. male with medical history significant of advanced dementia who was recently hospitalized from 9/2-6 with AKI and 9 mm stone in proximal left ureter in his solitary kidney; Dr. Alvester Morin was consulted and performed cystoscopy with IVP and stent placement.  He has seen urology in the interim (9/27) with plan for outpatient ureteroscopy with laser lithotripsy for stone removal; caregivers have not had further f/u about when this may be performed and are frustrated about this; he is scheduled to see urology again on Nov. 12. He has been complaining of his stomach feeling funny, not eating.  176 -> 147 pounds since last admission.  Complained of abdominal pain today - but midepigastric in nature, it appears.    This AM he fell at home and hit his head, progressive weakness.  No fever. In the ED it was noted patient was in AKI and has new hydronephrosis with new  stones urology consulted.   Assessment & Plan:   Principal Problem:   Ureteral stone with hydronephrosis Active Problems:   Dementia (HCC)   AKI (acute kidney injury) (HCC)   S/p nephrectomy   #1 left hydronephrosis with new ureteral stones in patient with history of right nephrectomy.  Seen by urology plan for ureteroscopy 10/8  Patient had CT abdomen 01/14/2020 with 9 mm stone in the proximal left ureter followed by which she had a cystoscopy on 01/05/2020 and left ureteral stent placement by Dr. Alvester Morin. He followed with [facility] on 01/29/2020 and was planning to have ureteroscopy with laser lithotripsy.  Meanwhile he came to [facility] and now with new left mid ureter stone with hydroureteronephrosis.  Patient had persistent abdominal pain weight loss cachexia and falls prior to admission. Urine culture with Staph hemolyticus On iv rocephin  #2 AKI-on admission his creatinine was 2.26 which is down to  1.6 on IV fluids recheck labs in a.m.NPO after MN for cystoscopy in am.  #3 history of dementia patient lives at home with 24-hour care.  Continue Zoloft.   Nutrition Problem: Inadequate oral intake Etiology: poor appetite (abdominal pain)     Signs/Symptoms: per patient/family report, energy intake < 75% for > or equal to 1 month    Interventions: Ensure Enlive (each supplement provides 350kcal and 20 grams of protein), Magic cup, MVI  Estimated body mass index is 20.57 kg/m as calculated from the following:   Height as of 06/17/16: 6' (1.829 m).   Weight as of this encounter: 68.8 kg.  DVT prophylaxis: SCD  code Status: Full code  family Communication: Discussed with caregiver at bedside  disposition Plan:  Status is: Inpatient  Dispo: The patient is from: Home              Anticipated d/c is to: Home              Anticipated d/c date is: > 3 days              Patient currently is not medically stable to d/c.  Patient with new left hydronephrosis awaiting surgery associated with AKI on IV fluids and iv antibiotics   Consultants:   Urology  Procedures: None Antimicrobials: None  Subjective: Patient sitting up by the side of the bed sitter by the bedside Patient ambulating in room  Objective: Vitals:   02/07/20 1644 02/07/20 2114 02/08/20 0557 02/08/20 0800  BP:  120/82 96/67 119/78  Pulse:  80 67 98  Resp:  17 18 17   Temp:  98 F (36.7 C) 98.5 F (36.9 C) 98.1 F (36.7 C)  TempSrc:  Oral Oral Oral  SpO2:  100% 93% 98%  Weight: 68.8 kg       Intake/Output Summary (Last 24 hours) at 02/08/2020 1011 Last data filed at 02/07/2020 1716 Gross per 24 hour  Intake 1465.88 ml  Output 300 ml  Net 1165.88 ml   Filed Weights   02/07/20 1644  Weight: 68.8 kg    Examination:  General exam: Appears calm and comfortable  Respiratory system: Clear to auscultation. Respiratory effort normal. Cardiovascular system: S1 & S2 heard, RRR. No JVD, murmurs, rubs,  gallops or clicks. No pedal edema. Gastrointestinal system: Abdomen is nondistended, soft and nontender. No organomegaly or masses felt. Normal bowel sounds heard. Central nervous system: Alert and oriented. No focal neurological deficits. Extremities: Symmetric 5 x 5 power. Skin: No rashes, lesions or ulcers Psychiatry: Judgement and insight appear normal. Mood & affect appropriate.     Data Reviewed: I have personally reviewed following labs and imaging studies  CBC: Recent Labs  Lab 02/06/20 1001 02/07/20 0229  WBC 10.8* 9.6  HGB 15.4 12.4*  HCT 46.0 38.2*  MCV 97.5 97.7  PLT 234 192   Basic Metabolic Panel: Recent Labs  Lab 02/06/20 1001 02/07/20 0229 02/08/20 0130  NA 144 142 142  K 4.2 3.6 3.5  CL 105 107 106  CO2 24 23 26   GLUCOSE 120* 103* 121*  BUN 27* 25* 18  CREATININE 2.26* 1.91* 1.65*  CALCIUM 9.7 8.7* 9.0   GFR: CrCl cannot be calculated (Unknown ideal weight.). Liver Function Tests: Recent Labs  Lab 02/06/20 1001  AST 20  ALT 19  ALKPHOS 79  BILITOT 1.1  PROT 6.9  ALBUMIN 3.8   Recent Labs  Lab 02/06/20 1001  LIPASE 29   No results for input(s): AMMONIA in the last 168 hours. Coagulation Profile: No results for input(s): INR, PROTIME in the last 168 hours. Cardiac Enzymes: No results for input(s): CKTOTAL, CKMB, CKMBINDEX, TROPONINI in the last 168 hours. BNP (last 3 results) No results for input(s): PROBNP in the last 8760 hours. HbA1C: No results for input(s): HGBA1C in the last 72 hours. CBG: No results for input(s): GLUCAP in the last 168 hours. Lipid Profile: No results for input(s): CHOL, HDL, LDLCALC, TRIG, CHOLHDL, LDLDIRECT in the last 72 hours. Thyroid Function Tests: No results for input(s): TSH, T4TOTAL, FREET4, T3FREE, THYROIDAB in the last 72 hours. Anemia Panel: No results for input(s): VITAMINB12, FOLATE, FERRITIN, TIBC, IRON, RETICCTPCT in the last 72 hours. Sepsis Labs: No results for input(s): PROCALCITON,  LATICACIDVEN in the last 168 hours.  Recent Results (from the past 240 hour(s))  Urine culture     Status: Abnormal   Collection Time: 02/06/20  4:12 PM   Specimen: Urine, Random  Result Value Ref Range Status   Specimen Description URINE, RANDOM  Final   Special Requests   Final    NONE Performed at [facility], [address] [place], [address] [place]    Culture 40,000 COLONIES/mL STAPHYLOCOCCUS HAEMOLYTICUS (A)  Final   Report Status 02/08/2020 FINAL  Final   Organism ID, Bacteria STAPHYLOCOCCUS HAEMOLYTICUS (A)  Final      Susceptibility   Staphylococcus haemolyticus - MIC*    CIPROFLOXACIN >=8 RESISTANT Resistant     GENTAMICIN <=0.5 SENSITIVE Sensitive     NITROFURANTOIN <=16  SENSITIVE Sensitive     OXACILLIN >=4 RESISTANT Resistant     TETRACYCLINE 2 SENSITIVE Sensitive     VANCOMYCIN <=0.5 SENSITIVE Sensitive     TRIMETH/SULFA <=10 SENSITIVE Sensitive     CLINDAMYCIN >=8 RESISTANT Resistant     RIFAMPIN 1 SENSITIVE Sensitive     Inducible Clindamycin NEGATIVE Sensitive     * 40,000 COLONIES/mL STAPHYLOCOCCUS HAEMOLYTICUS  Respiratory Panel by RT PCR (Flu A&B, Covid) - Nasopharyngeal Swab     Status: None   Collection Time: 02/06/20  7:28 PM   Specimen: Nasopharyngeal Swab  Result Value Ref Range Status   SARS Coronavirus 2 by RT PCR NEGATIVE NEGATIVE Final    Comment: (NOTE) SARS-CoV-2 target nucleic acids are NOT DETECTED.  The SARS-CoV-2 RNA is generally detectable in upper respiratoy specimens during the acute phase of infection. The lowest concentration of SARS-CoV-2 viral copies this assay can detect is 131 copies/mL. A negative result does not preclude SARS-Cov-2 infection and should not be used as the sole basis for treatment or other patient management decisions. A negative result may occur with  improper specimen collection/handling, submission of specimen other than nasopharyngeal swab, presence of viral mutation(s) within the areas targeted by  this assay, and inadequate number of viral copies (<131 copies/mL). A negative result must be combined with clinical observations, patient history, and epidemiological information. The expected result is Negative.  Fact Sheet for Patients:  https://www.moore.com/  Fact Sheet for Healthcare Providers:  https://www.young.biz/  This test is no t yet approved or cleared by the [place] FDA and  has been authorized for detection and/or diagnosis of SARS-CoV-2 by FDA under an Emergency Use Authorization (EUA). This EUA will remain  in effect (meaning this test can be used) for the duration of the COVID-19 declaration under Section 564(b)(1) of the Act, 21 U.S.C. section 360bbb-3(b)(1), unless the authorization is terminated or revoked sooner.     Influenza A by PCR NEGATIVE NEGATIVE Final   Influenza B by PCR NEGATIVE NEGATIVE Final    Comment: (NOTE) The Xpert Xpress SARS-CoV-2/FLU/RSV assay is intended as an aid in  the diagnosis of influenza from Nasopharyngeal swab specimens and  should not be used as a sole basis for treatment. Nasal washings and  aspirates are unacceptable for Xpert Xpress SARS-CoV-2/FLU/RSV  testing.  Fact Sheet for Patients: https://www.moore.com/  Fact Sheet for Healthcare Providers: https://www.young.biz/  This test is not yet approved or cleared by the [place] FDA and  has been authorized for detection and/or diagnosis of SARS-CoV-2 by  FDA under an Emergency Use Authorization (EUA). This EUA will remain  in effect (meaning this test can be used) for the duration of the  Covid-19 declaration under Section 564(b)(1) of the Act, 21  U.S.C. section 360bbb-3(b)(1), unless the authorization is  terminated or revoked. Performed at [facility], [address], [place], [place] [address]          Radiology Studies: CT Abdomen Pelvis Wo Contrast  Result  Date: 02/06/2020 CLINICAL DATA:  Flank pain.  Confusion. EXAM: CT ABDOMEN AND PELVIS WITHOUT CONTRAST TECHNIQUE: Multidetector CT imaging of the abdomen and pelvis was performed following the standard protocol without IV contrast. COMPARISON:  CT scan 01/04/2020 FINDINGS: Lower chest: The lung bases are clear of acute process. No pleural effusion or pulmonary lesions. The heart is normal in size. No pericardial effusion. The distal esophagus and aorta are unremarkable. Hepatobiliary: No hepatic lesions are identified without contrast. The gallbladder appears normal. No common  bile duct dilatation. Pancreas: No mass, inflammation or ductal dilatation. Spleen: Normal size.  No focal lesions. Adrenals/Urinary Tract: The adrenal glands are unremarkable. The right kidney is surgically absent. The left kidney contains a double-J ureteral stent. Mild hydroureteronephrosis noted. There are 2 adjacent calculi in the left mid ureter at the L4 level. Both calculi measure approximately 7 mm in length. I do not see any distal ureteral calculi. There are small layering calculi in the bladder and a larger calculus in the left ladder near the loop of the double-J stent. This measures 5.5 mm. Stomach/Bowel: The stomach, duodenum, small bowel and colon are grossly normal without oral contrast. No inflammatory changes, mass lesions or obstructive findings. The appendix is normal. Vascular/Lymphatic: Stable scattered vascular calcifications. No abdominal or pelvic lymphadenopathy. Reproductive: Mild prostate gland enlargement with median lobe hypertrophy impressing on the base of the bladder. The seminal vesicles are unremarkable. Other: No pelvic mass or adenopathy. No free pelvic fluid collections. No inguinal mass or adenopathy. No abdominal wall hernia or subcutaneous lesions. Musculoskeletal: No significant bony findings. IMPRESSION: 1. Two adjacent calculi in the left mid ureter at the L4 level. Both calculi measure approximately  7 mm in length. 2. Mild left-sided hydroureteronephrosis. 3. Status post right nephrectomy. 4. Stable bladder calculi. 5. No other significant abdominal/pelvic findings and no mass lesions or adenopathy. 6. Aortic atherosclerosis. Aortic Atherosclerosis (ICD10-I70.0). Electronically Signed   By: Rudie MeyerP.  Gallerani M.D.   On: 02/06/2020 15:00   CT Head Wo Contrast  Result Date: 02/06/2020 CLINICAL DATA:  Larey SeatFell.  Hit head.  Altered mental status. EXAM: CT HEAD WITHOUT CONTRAST CT CERVICAL SPINE WITHOUT CONTRAST TECHNIQUE: Multidetector CT imaging of the head and cervical spine was performed following the standard protocol without intravenous contrast. Multiplanar CT image reconstructions of the cervical spine were also generated. COMPARISON:  Brain MRI 10/15/2010 FINDINGS: CT HEAD FINDINGS Brain: Mild age advanced cerebral atrophy, ventriculomegaly and periventricular white matter disease. No extra-axial fluid collections are identified. No CT findings for acute hemispheric infarction or intracranial hemorrhage. No mass lesions. The brainstem and cerebellum are normal. Vascular: Mild vascular calcifications but no aneurysm or hyperdense vessels. Skull: No acute skull fracture or worrisome bone lesions. Sinuses/Orbits: The paranasal sinuses and mastoid air cells are clear. The globes are intact. Other: Small left frontal scalp hematoma but no foreign body or underlying fracture. CT CERVICAL SPINE FINDINGS Alignment: Normal. Skull base and vertebrae: No acute fracture. No primary bone lesion or focal pathologic process. The C2-3 facets are fused bilaterally. Soft tissues and spinal canal: No prevertebral fluid or swelling. No visible canal hematoma. Disc levels: The spinal canal is quite generous. No large disc protrusions, significant spinal or foraminal stenosis. Air-filled bone cyst noted in the C7 vertebral body. Upper chest: The visualized lung apices are grossly clear. Other: No neck mass or adenopathy. IMPRESSION:  1. Mild age advanced cerebral atrophy, ventriculomegaly and periventricular white matter disease. 2. No acute intracranial findings or skull fracture. 3. Normal alignment of the cervical vertebral bodies and no acute cervical spine fracture. Electronically Signed   By: Rudie MeyerP.  Gallerani M.D.   On: 02/06/2020 14:52   CT Cervical Spine Wo Contrast  Result Date: 02/06/2020 CLINICAL DATA:  Larey SeatFell.  Hit head.  Altered mental status. EXAM: CT HEAD WITHOUT CONTRAST CT CERVICAL SPINE WITHOUT CONTRAST TECHNIQUE: Multidetector CT imaging of the head and cervical spine was performed following the standard protocol without intravenous contrast. Multiplanar CT image reconstructions of the cervical spine were also generated. COMPARISON:  Brain MRI 10/15/2010 FINDINGS: CT HEAD FINDINGS Brain: Mild age advanced cerebral atrophy, ventriculomegaly and periventricular white matter disease. No extra-axial fluid collections are identified. No CT findings for acute hemispheric infarction or intracranial hemorrhage. No mass lesions. The brainstem and cerebellum are normal. Vascular: Mild vascular calcifications but no aneurysm or hyperdense vessels. Skull: No acute skull fracture or worrisome bone lesions. Sinuses/Orbits: The paranasal sinuses and mastoid air cells are clear. The globes are intact. Other: Small left frontal scalp hematoma but no foreign body or underlying fracture. CT CERVICAL SPINE FINDINGS Alignment: Normal. Skull base and vertebrae: No acute fracture. No primary bone lesion or focal pathologic process. The C2-3 facets are fused bilaterally. Soft tissues and spinal canal: No prevertebral fluid or swelling. No visible canal hematoma. Disc levels: The spinal canal is quite generous. No large disc protrusions, significant spinal or foraminal stenosis. Air-filled bone cyst noted in the C7 vertebral body. Upper chest: The visualized lung apices are grossly clear. Other: No neck mass or adenopathy. IMPRESSION: 1. Mild age  advanced cerebral atrophy, ventriculomegaly and periventricular white matter disease. 2. No acute intracranial findings or skull fracture. 3. Normal alignment of the cervical vertebral bodies and no acute cervical spine fracture. Electronically Signed   By: Rudie Meyer M.D.   On: 02/06/2020 14:52        Scheduled Meds: . docusate sodium  100 mg Oral BID  . feeding supplement (ENSURE ENLIVE)  237 mL Oral BID BM  . fluconazole  100 mg Oral Daily  . multivitamin with minerals  1 tablet Oral Daily  . sertraline  25 mg Oral Daily   Continuous Infusions: . lactated ringers Stopped (02/08/20 0820)     LOS: 2 days     Gerald Ren, MD  02/08/2020, 10:11 AM

## 2020-02-09 ENCOUNTER — Inpatient Hospital Stay (HOSPITAL_COMMUNITY): Payer: Medicare Other | Admitting: Certified Registered Nurse Anesthetist

## 2020-02-09 ENCOUNTER — Encounter (HOSPITAL_COMMUNITY)
Admission: EM | Disposition: A | Discharge status: Home / Self Care | Payer: Self-pay | Source: Home / Self Care | Attending: Internal Medicine

## 2020-02-09 ENCOUNTER — Inpatient Hospital Stay (HOSPITAL_COMMUNITY): Payer: Medicare Other

## 2020-02-09 ENCOUNTER — Encounter (HOSPITAL_COMMUNITY): Payer: Self-pay | Admitting: Internal Medicine

## 2020-02-09 HISTORY — PX: CYSTOSCOPY WITH RETROGRADE PYELOGRAM, URETEROSCOPY AND STENT PLACEMENT: SHX5789

## 2020-02-09 HISTORY — PX: CYSTOSCOPY WITH LITHOLAPAXY: SHX1425

## 2020-02-09 HISTORY — PX: HOLMIUM LASER APPLICATION: SHX5852

## 2020-02-09 LAB — SURGICAL PCR SCREEN
MRSA, PCR: NEGATIVE
Staphylococcus aureus: POSITIVE — AB

## 2020-02-09 SURGERY — CYSTOURETEROSCOPY, WITH RETROGRADE PYELOGRAM AND STENT INSERTION
Anesthesia: General

## 2020-02-09 MED ORDER — SUCCINYLCHOLINE CHLORIDE 200 MG/10ML IV SOSY
PREFILLED_SYRINGE | INTRAVENOUS | Status: AC
  Filled 2020-02-09: qty 20

## 2020-02-09 MED ORDER — LACTATED RINGERS IV SOLN: INTRAVENOUS | Status: DC | PRN

## 2020-02-09 MED ORDER — 0.9 % SODIUM CHLORIDE (POUR BTL) OPTIME
TOPICAL | Status: DC | PRN
  Administered 2020-02-09: 1000 mL

## 2020-02-09 MED ORDER — LIDOCAINE 2% (20 MG/ML) 5 ML SYRINGE
INTRAMUSCULAR | Status: AC
  Filled 2020-02-09: qty 5

## 2020-02-09 MED ORDER — EPHEDRINE SULFATE 50 MG/ML IJ SOLN
INTRAMUSCULAR | Status: DC | PRN
  Administered 2020-02-09: 10 mg via INTRAVENOUS

## 2020-02-09 MED ORDER — FENTANYL CITRATE (PF) 250 MCG/5ML IJ SOLN
INTRAMUSCULAR | Status: AC
  Filled 2020-02-09: qty 5

## 2020-02-09 MED ORDER — ONDANSETRON HCL 4 MG/2ML IJ SOLN
INTRAMUSCULAR | Status: DC | PRN
  Administered 2020-02-09: 4 mg via INTRAVENOUS

## 2020-02-09 MED ORDER — SODIUM CHLORIDE 0.9 % IR SOLN
Status: DC | PRN
  Administered 2020-02-09: 4000 mL via INTRAVESICAL

## 2020-02-09 MED ORDER — CHLORHEXIDINE GLUCONATE 0.12 % MT SOLN
15.0000 mL | Freq: Once | OROMUCOSAL | Status: AC
  Filled 2020-02-09: qty 15

## 2020-02-09 MED ORDER — IOHEXOL 300 MG/ML  SOLN
INTRAMUSCULAR | Status: DC | PRN
  Administered 2020-02-09: 10 mL via URETHRAL

## 2020-02-09 MED ORDER — ACETAMINOPHEN 500 MG PO TABS
ORAL_TABLET | ORAL | Status: AC
  Administered 2020-02-09: 1000 mg via ORAL
  Filled 2020-02-09: qty 2

## 2020-02-09 MED ORDER — LIDOCAINE HCL URETHRAL/MUCOSAL 2 % EX GEL
CUTANEOUS | Status: AC
  Filled 2020-02-09: qty 11

## 2020-02-09 MED ORDER — DEXAMETHASONE SODIUM PHOSPHATE 10 MG/ML IJ SOLN
INTRAMUSCULAR | Status: AC
  Filled 2020-02-09: qty 1

## 2020-02-09 MED ORDER — PROPOFOL 10 MG/ML IV BOLUS
INTRAVENOUS | Status: AC
  Filled 2020-02-09: qty 20

## 2020-02-09 MED ORDER — SODIUM CHLORIDE 0.9 % IV SOLN
1.0000 g | INTRAVENOUS | Status: DC
  Administered 2020-02-09: 1 g via INTRAVENOUS
  Filled 2020-02-09: qty 10
  Filled 2020-02-09: qty 1

## 2020-02-09 MED ORDER — PHENYLEPHRINE 40 MCG/ML (10ML) SYRINGE FOR IV PUSH (FOR BLOOD PRESSURE SUPPORT)
PREFILLED_SYRINGE | INTRAVENOUS | Status: AC
  Filled 2020-02-09: qty 30

## 2020-02-09 MED ORDER — ACETAMINOPHEN 500 MG PO TABS: 1000.0000 mg | ORAL_TABLET | Freq: Once | ORAL | Status: AC

## 2020-02-09 MED ORDER — CHLORHEXIDINE GLUCONATE 0.12 % MT SOLN
OROMUCOSAL | Status: AC
  Administered 2020-02-09: 15 mL via OROMUCOSAL
  Filled 2020-02-09: qty 15

## 2020-02-09 MED ORDER — FENTANYL CITRATE (PF) 100 MCG/2ML IJ SOLN
INTRAMUSCULAR | Status: DC | PRN
Start: 2020-02-09 — End: 2020-02-09
  Administered 2020-02-09 (×2): 25 ug via INTRAVENOUS

## 2020-02-09 MED ORDER — ONDANSETRON HCL 4 MG/2ML IJ SOLN
INTRAMUSCULAR | Status: AC
  Filled 2020-02-09: qty 2

## 2020-02-09 MED ORDER — LIDOCAINE 2% (20 MG/ML) 5 ML SYRINGE
INTRAMUSCULAR | Status: AC
  Filled 2020-02-09: qty 10

## 2020-02-09 MED ORDER — FENTANYL CITRATE (PF) 100 MCG/2ML IJ SOLN
25.0000 ug | INTRAMUSCULAR | Status: DC | PRN
  Administered 2020-02-09: 25 ug via INTRAVENOUS

## 2020-02-09 MED ORDER — ROCURONIUM BROMIDE 10 MG/ML (PF) SYRINGE
PREFILLED_SYRINGE | INTRAVENOUS | Status: AC
  Filled 2020-02-09: qty 10

## 2020-02-09 MED ORDER — ONDANSETRON HCL 4 MG/2ML IJ SOLN
INTRAMUSCULAR | Status: AC
  Filled 2020-02-09: qty 4

## 2020-02-09 MED ORDER — EPHEDRINE 5 MG/ML INJ
INTRAVENOUS | Status: AC
  Filled 2020-02-09: qty 30

## 2020-02-09 MED ORDER — FENTANYL CITRATE (PF) 100 MCG/2ML IJ SOLN
INTRAMUSCULAR | Status: AC
  Administered 2020-02-09: 25 ug via INTRAVENOUS
  Filled 2020-02-09: qty 2

## 2020-02-09 MED ORDER — LACTATED RINGERS IV SOLN: INTRAVENOUS | Status: DC

## 2020-02-09 MED ORDER — PROMETHAZINE HCL 25 MG/ML IJ SOLN: 6.2500 mg | INTRAMUSCULAR | Status: DC | PRN

## 2020-02-09 MED ORDER — LIDOCAINE 2% (20 MG/ML) 5 ML SYRINGE
INTRAMUSCULAR | Status: DC | PRN
  Administered 2020-02-09: 30 mg via INTRAVENOUS

## 2020-02-09 MED ORDER — PHENYLEPHRINE HCL (PRESSORS) 10 MG/ML IV SOLN
INTRAVENOUS | Status: DC | PRN
  Administered 2020-02-09: 100 ug via INTRAVENOUS
  Administered 2020-02-09: 50 ug via INTRAVENOUS

## 2020-02-09 MED ORDER — PROMETHAZINE HCL 25 MG/ML IJ SOLN
INTRAMUSCULAR | Status: AC
  Administered 2020-02-09: 6.25 mg via INTRAVENOUS
  Filled 2020-02-09: qty 1

## 2020-02-09 MED ORDER — PROPOFOL 10 MG/ML IV BOLUS
INTRAVENOUS | Status: DC | PRN
  Administered 2020-02-09: 140 mg via INTRAVENOUS

## 2020-02-09 MED ORDER — MIDAZOLAM HCL 2 MG/2ML IJ SOLN
INTRAMUSCULAR | Status: AC
  Filled 2020-02-09: qty 2

## 2020-02-09 SURGICAL SUPPLY — 45 items
BAG URO CATCHER STRL LF (MISCELLANEOUS) ×3 IMPLANT
BASKET LASER NITINOL 1.9FR (BASKET) ×1 IMPLANT
BASKET STNLS GEMINI 4WIRE 3FR (BASKET) IMPLANT
BASKET ZERO TIP NITINOL 2.4FR (BASKET) IMPLANT
BSKT STON RTRVL 120 1.9FR (BASKET) ×2
BSKT STON RTRVL GEM 120X11 3FR (BASKET)
BSKT STON RTRVL ZERO TP 2.4FR (BASKET)
CATH INTERMIT  6FR 70CM (CATHETERS) IMPLANT
CATH URET 5FR 28IN CONE TIP (BALLOONS)
CATH URET 5FR 70CM CONE TIP (BALLOONS) IMPLANT
CNTNR URN SCR LID CUP LEK RST (MISCELLANEOUS) IMPLANT
CONT SPEC 4OZ STRL OR WHT (MISCELLANEOUS) ×3
COVER SURGICAL LIGHT HANDLE (MISCELLANEOUS) ×3 IMPLANT
COVER WAND RF STERILE (DRAPES) ×3 IMPLANT
ELECT REM PT RETURN 9FT ADLT (ELECTROSURGICAL)
ELECTRODE REM PT RTRN 9FT ADLT (ELECTROSURGICAL) IMPLANT
FIBER LASER SLIMLINE GI 365 (MISCELLANEOUS) ×1 IMPLANT
FIBER LASER TRAC TIP (UROLOGICAL SUPPLIES) IMPLANT
FIBER LASER TRACTIP 200 (UROLOGICAL SUPPLIES) ×1 IMPLANT
GLOVE BIO SURGEON STRL SZ7.5 (GLOVE) ×3 IMPLANT
GOWN STRL REUS W/ TWL LRG LVL3 (GOWN DISPOSABLE) ×2 IMPLANT
GOWN STRL REUS W/TWL LRG LVL3 (GOWN DISPOSABLE) ×3
GUIDEWIRE ANG ZIPWIRE 038X150 (WIRE) ×4 IMPLANT
GUIDEWIRE STR DUAL SENSOR (WIRE) ×4 IMPLANT
IV NS 1000ML (IV SOLUTION) ×3
IV NS 1000ML BAXH (IV SOLUTION) ×2 IMPLANT
IV NS IRRIG 3000ML ARTHROMATIC (IV SOLUTION) ×3 IMPLANT
KIT BALLIN UROMAX 15FX10 (LABEL) IMPLANT
KIT BALLN UROMAX 15FX4 (MISCELLANEOUS) IMPLANT
KIT BALLN UROMAX 26 75X4 (MISCELLANEOUS)
KIT TURNOVER KIT B (KITS) ×3 IMPLANT
MANIFOLD NEPTUNE II (INSTRUMENTS) ×6 IMPLANT
NS IRRIG 1000ML POUR BTL (IV SOLUTION) ×6 IMPLANT
PACK CYSTO (CUSTOM PROCEDURE TRAY) ×3 IMPLANT
SET HIGH PRES BAL DIL (LABEL)
SET IRRIG Y TYPE TUR BLADDER L (SET/KITS/TRAYS/PACK) ×3 IMPLANT
SHEATH URETERAL 12FRX35CM (MISCELLANEOUS) ×1 IMPLANT
SOL PREP POV-IOD 4OZ 10% (MISCELLANEOUS) ×3 IMPLANT
STENT POLARIS 5FRX26 (STENTS) ×1 IMPLANT
SYR 10ML LL (SYRINGE) ×4 IMPLANT
TUBE CONNECTING 12X1/4 (SUCTIONS) IMPLANT
TUBE CONNECTING 20X1/4 (TUBING) ×1 IMPLANT
TUBE FEEDING 8FR 16IN STR KANG (MISCELLANEOUS) ×1 IMPLANT
WATER STERILE IRR 1000ML POUR (IV SOLUTION) ×3 IMPLANT
WATER STERILE IRR 3000ML UROMA (IV SOLUTION) IMPLANT

## 2020-02-09 NOTE — Progress Notes (Signed)
PROGRESS NOTE    Gerald Diaz  OIZ:[id number] DOB: 05-May-1955 DOA: 02/06/2020 PCP: [name], MD  Brief Narrative:Gerald Diaz is a 64 y.o. male with medical history significant of advanced dementia who was recently hospitalized from 9/2-6 with AKI and 9 mm stone in proximal left ureter in his solitary kidney; Dr. Alvester Morin was consulted and performed cystoscopy with IVP and stent placement.  He has seen urology in the interim (9/27) with plan for outpatient ureteroscopy with laser lithotripsy for stone removal; caregivers have not had further f/u about when this may be performed and are frustrated about this; he is scheduled to see urology again on Nov. 12. He has been complaining of his stomach feeling funny, not eating.  176 -> 147 pounds since last admission.  Complained of abdominal pain today - but midepigastric in nature, it appears.    This AM he fell at home and hit his head, progressive weakness.  No fever. In the ED it was noted patient was in AKI and has new hydronephrosis with new  stones urology consulted.   Assessment & Plan:   Principal Problem:   Ureteral stone with hydronephrosis Active Problems:   Dementia (HCC)   AKI (acute kidney injury) (HCC)   S/p nephrectomy   #1 left hydronephrosis with new ureteral stones in patient with history of right nephrectomy.  Seen by urology plan for ureteroscopy 10/8  Patient had CT abdomen 01/14/2020 with 9 mm stone in the proximal left ureter followed by which she had a cystoscopy on 01/05/2020 and left ureteral stent placement by Dr. Alvester Morin. He followed with [facility] on 01/29/2020 and was planning to have ureteroscopy with laser lithotripsy.  Meanwhile he came to [facility] and now with new left mid ureter stone with hydroureteronephrosis.  Patient had persistent abdominal pain weight loss cachexia and falls prior to admission. Urine culture with Staph hemolyticus Started Rocephin as patient is symptomatic  #2 AKI-on admission his creatinine  was 2.26 which is down to 1.6 on IV fluids.for cystoscopy today.  #3 history of dementia patient lives at home with 24-hour care.  Continue Zoloft.   Nutrition Problem: Inadequate oral intake Etiology: poor appetite (abdominal pain)     Signs/Symptoms: per patient/family report, energy intake < 75% for > or equal to 1 month    Interventions: Ensure Enlive (each supplement provides 350kcal and 20 grams of protein), Magic cup, MVI  Estimated body mass index is 21.14 kg/m as calculated from the following:   Height as of 06/17/16: 6' (1.829 m).   Weight as of this encounter: 70.7 kg.  DVT prophylaxis: SCD  code Status: Full code  family Communication: Discussed with caregiver at bedside  disposition Plan:  Status is: Inpatient  Dispo: The patient is from: Home              Anticipated d/c is to: Home              Anticipated d/c date is: > 3 days              Patient currently is not medically stable to d/c.  Patient with new left hydronephrosis awaiting surgery associated with AKI on IV fluids and iv antibiotics   Consultants:   Urology  Procedures: None Antimicrobials: None  Subjective: Patient is resting in bed sitter by the bedside he reported that he had multiple episodes of incontinence of urine which was new for him. Objective: Vitals:   02/08/20 1221 02/08/20 2008 02/09/20 0158 02/09/20 3382  BP: 122/78 111/77  122/88  Pulse: 93 67  69  Resp: 18 17  15   Temp: 98 F (36.7 C) 97.7 F (36.5 C)  97.7 F (36.5 C)  TempSrc: Oral Oral  Oral  SpO2: 98% 99%  99%  Weight:   70.7 kg     Intake/Output Summary (Last 24 hours) at 02/09/2020 1309 Last data filed at 02/09/2020 0420 Gross per 24 hour  Intake 420 ml  Output --  Net 420 ml   Filed Weights   02/07/20 1644 02/09/20 0158  Weight: 68.8 kg 70.7 kg    Examination:  General exam: Appears calm and comfortable  Respiratory system: Clear to auscultation. Respiratory effort normal. Cardiovascular system: S1  & S2 heard, RRR. No JVD, murmurs, rubs, gallops or clicks. No pedal edema. Gastrointestinal system: Abdomen is nondistended, soft and nontender. No organomegaly or masses felt. Normal bowel sounds heard. Central nervous system: Alert and oriented. No focal neurological deficits. Extremities: Symmetric 5 x 5 power. Skin: No rashes, lesions or ulcers Psychiatry: Judgement and insight appear normal. Mood & affect appropriate.     Data Reviewed: I have personally reviewed following labs and imaging studies  CBC: Recent Labs  Lab 02/06/20 1001 02/07/20 0229  WBC 10.8* 9.6  HGB 15.4 12.4*  HCT 46.0 38.2*  MCV 97.5 97.7  PLT 234 192   Basic Metabolic Panel: Recent Labs  Lab 02/06/20 1001 02/07/20 0229 02/08/20 0130  NA 144 142 142  K 4.2 3.6 3.5  CL 105 107 106  CO2 24 23 26   GLUCOSE 120* 103* 121*  BUN 27* 25* 18  CREATININE 2.26* 1.91* 1.65*  CALCIUM 9.7 8.7* 9.0   GFR: CrCl cannot be calculated (Unknown ideal weight.). Liver Function Tests: Recent Labs  Lab 02/06/20 1001  AST 20  ALT 19  ALKPHOS 79  BILITOT 1.1  PROT 6.9  ALBUMIN 3.8   Recent Labs  Lab 02/06/20 1001  LIPASE 29   No results for input(s): AMMONIA in the last 168 hours. Coagulation Profile: No results for input(s): INR, PROTIME in the last 168 hours. Cardiac Enzymes: No results for input(s): CKTOTAL, CKMB, CKMBINDEX, TROPONINI in the last 168 hours. BNP (last 3 results) No results for input(s): PROBNP in the last 8760 hours. HbA1C: No results for input(s): HGBA1C in the last 72 hours. CBG: No results for input(s): GLUCAP in the last 168 hours. Lipid Profile: No results for input(s): CHOL, HDL, LDLCALC, TRIG, CHOLHDL, LDLDIRECT in the last 72 hours. Thyroid Function Tests: No results for input(s): TSH, T4TOTAL, FREET4, T3FREE, THYROIDAB in the last 72 hours. Anemia Panel: No results for input(s): VITAMINB12, FOLATE, FERRITIN, TIBC, IRON, RETICCTPCT in the last 72 hours. Sepsis Labs: No  results for input(s): PROCALCITON, LATICACIDVEN in the last 168 hours.  Recent Results (from the past 240 hour(s))  Urine culture     Status: Abnormal   Collection Time: 02/06/20  4:12 PM   Specimen: Urine, Random  Result Value Ref Range Status   Specimen Description URINE, RANDOM  Final   Special Requests   Final    NONE Performed at [facility], [address] [place], [address] [place]    Culture 40,000 COLONIES/mL STAPHYLOCOCCUS HAEMOLYTICUS (A)  Final   Report Status 02/08/2020 FINAL  Final   Organism ID, Bacteria STAPHYLOCOCCUS HAEMOLYTICUS (A)  Final      Susceptibility   Staphylococcus haemolyticus - MIC*    CIPROFLOXACIN >=8 RESISTANT Resistant     GENTAMICIN <=0.5 SENSITIVE Sensitive  NITROFURANTOIN <=16 SENSITIVE Sensitive     OXACILLIN >=4 RESISTANT Resistant     TETRACYCLINE 2 SENSITIVE Sensitive     VANCOMYCIN <=0.5 SENSITIVE Sensitive     TRIMETH/SULFA <=10 SENSITIVE Sensitive     CLINDAMYCIN >=8 RESISTANT Resistant     RIFAMPIN 1 SENSITIVE Sensitive     Inducible Clindamycin NEGATIVE Sensitive     * 40,000 COLONIES/mL STAPHYLOCOCCUS HAEMOLYTICUS  Respiratory Panel by RT PCR (Flu A&B, Covid) - Nasopharyngeal Swab     Status: None   Collection Time: 02/06/20  7:28 PM   Specimen: Nasopharyngeal Swab  Result Value Ref Range Status   SARS Coronavirus 2 by RT PCR NEGATIVE NEGATIVE Final    Comment: (NOTE) SARS-CoV-2 target nucleic acids are NOT DETECTED.  The SARS-CoV-2 RNA is generally detectable in upper respiratoy specimens during the acute phase of infection. The lowest concentration of SARS-CoV-2 viral copies this assay can detect is 131 copies/mL. A negative result does not preclude SARS-Cov-2 infection and should not be used as the sole basis for treatment or other patient management decisions. A negative result may occur with  improper specimen collection/handling, submission of specimen other than nasopharyngeal swab, presence of viral  mutation(s) within the areas targeted by this assay, and inadequate number of viral copies (<131 copies/mL). A negative result must be combined with clinical observations, patient history, and epidemiological information. The expected result is Negative.  Fact Sheet for Patients:  https://www.moore.com/  Fact Sheet for Healthcare Providers:  https://www.young.biz/  This test is no t yet approved or cleared by the [place] FDA and  has been authorized for detection and/or diagnosis of SARS-CoV-2 by FDA under an Emergency Use Authorization (EUA). This EUA will remain  in effect (meaning this test can be used) for the duration of the COVID-19 declaration under Section 564(b)(1) of the Act, 21 U.S.C. section 360bbb-3(b)(1), unless the authorization is terminated or revoked sooner.     Influenza A by PCR NEGATIVE NEGATIVE Final   Influenza B by PCR NEGATIVE NEGATIVE Final    Comment: (NOTE) The Xpert Xpress SARS-CoV-2/FLU/RSV assay is intended as an aid in  the diagnosis of influenza from Nasopharyngeal swab specimens and  should not be used as a sole basis for treatment. Nasal washings and  aspirates are unacceptable for Xpert Xpress SARS-CoV-2/FLU/RSV  testing.  Fact Sheet for Patients: https://www.moore.com/  Fact Sheet for Healthcare Providers: https://www.young.biz/  This test is not yet approved or cleared by the [place] FDA and  has been authorized for detection and/or diagnosis of SARS-CoV-2 by  FDA under an Emergency Use Authorization (EUA). This EUA will remain  in effect (meaning this test can be used) for the duration of the  Covid-19 declaration under Section 564(b)(1) of the Act, 21  U.S.C. section 360bbb-3(b)(1), unless the authorization is  terminated or revoked. Performed at [facility], [address], [place], [place] [address]          Radiology Studies: No  results found.      Scheduled Meds: . docusate sodium  100 mg Oral BID  . feeding supplement (ENSURE ENLIVE)  237 mL Oral BID BM  . fluconazole  100 mg Oral Daily  . multivitamin with minerals  1 tablet Oral Daily  . sertraline  25 mg Oral Daily   Continuous Infusions: . lactated ringers 75 mL/hr at 02/09/20 0156     LOS: 3 days     Alwyn Ren, MD  02/09/2020, 1:09 PM

## 2020-02-09 NOTE — TOC Initial Note (Signed)
Transition of Care [facility]) - Initial/Assessment Note    Patient Details  Name: Gerald Diaz MRN: [id number] Date of Birth: 04/21/1956  Transition of Care [facility]) CM/SW Contact:    Doy Hutching, LCSW Phone Number: 02/09/2020, 12:51 PM  Clinical Narrative:     **LATE ENTRY** CSW spoke with pt guardian Dorian at [phone]. CSW introduced self, role, reason for call. Pt from home w/ caregivers through [employer]- if anything recommended for pt please fax to Eliot Ford at [employer] for [employer] at [phone]. CSW will leave hand off for weekend CSW to follow for any additional needs.   Expected Discharge Plan: Home/Self Care Barriers to Discharge: Continued Medical Work up   Patient Goals and CMS Choice Patient states their goals for this hospitalization and ongoing recovery are:: return home w/ caregivers CMS Medicare.gov Compare Post Acute Care list provided to::  (n/a) Choice offered to / list presented to : [facility] POA / Guardian  Expected Discharge Plan and Services Expected Discharge Plan: Home/Self Care In-house Referral: Clinical Social Work Discharge Planning Services: CM Consult Post Acute Care Choice: Resumption of Svcs/PTA Provider Living arrangements for the past 2 months: Single Family Home   Prior Living Arrangements/Services Living arrangements for the past 2 months: Single Family Home Lives with:: Other (Comment) (caregivers) Patient language and need for interpreter reviewed:: Yes (no needs) Do you feel safe going back to the place where you live?: Yes      Need for Family Participation in Patient Care: Yes (Comment) (assistance w/ decision making and daily cares) Care giver support system in place?: Yes (comment) (pt guardian; private caregivers) Current home services: DME, Other (comment) (private caregivers) Criminal Activity/Legal Involvement Pertinent to Current Situation/Hospitalization: No - Comment as needed  Permission  Sought/Granted Permission sought to share information with : Family Supports Permission granted to share information with :  (guardianship papers scanned in; pt w/ dementia)  Share Information with NAME: Dorian Jacqlyn Krauss  Permission granted to share info w AGENCY: [employer] for [employer] granted to share info w Relationship: legal guardian  Permission granted to share info w Contact Information: [phone]  Emotional Assessment Appearance:: Other (Comment Required (telephonic assessment w/ guardian) Attitude/Demeanor/Rapport: Other (comment) (telephonic assessment w/ guardian) Affect (typically observed): Other (comment) (telephonic assessment w/ guardian) Orientation: : Fluctuating Orientation (Suspected and/or reported Sundowners) Alcohol / Substance Use: Not Applicable Psych Involvement: Outpatient Provider  Admission diagnosis:  AKI (acute kidney injury) (HCC) [N17.9] Ureteral stone with hydronephrosis [N13.2] Patient Active Problem List   Diagnosis Date Noted  . AKI (acute kidney injury) (HCC) 02/06/2020  . 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 Dementia (HCC) 01/05/2020  . Ureteral stone with hydronephrosis   . ARF (acute renal failure) (HCC) 01/04/2020   PCP:  [name], MD Pharmacy:   [facility] [place], [place] - [address] [place] [address] Phone: [phone] Fax: [phone]  Readmission Risk Interventions Readmission Risk Prevention Plan 02/09/2020  Post Dischage Appt Not Complete  Appt Comments d/c date tbd  Medication Screening Complete  Transportation Screening Complete  Some recent data might be hidden

## 2020-02-09 NOTE — Progress Notes (Signed)
Day of Surgery   Subjective/Chief Complaint:   1 - Left Ureteral / Renal Stones - approx 1cm volume stacked left ureteral stones, 28mm renal stone x 3, and several bladder stones by ER CT 02/2020. Left JJ stent placed recently in good position (placed by [facility] 01/2020). Then seen at [facility] 9/27 with plan for ureteroscopy as pt has a [employer] plan that we do not contract with, however, per guardian Smitty Cords never scheduled his surgery and is not responding to their calls which is understandably frustrating.   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  3 - Acute on Chronic Renal Failure - Cr 18 01/2020 up from baseline of <1 2010 with nadir of 1.7 after stenting and hydration. 2.2 by ER labs this admission. Poor PO intake per family.    4 - Funguria, Bacteruria - incidental scant funguria and bacteruria (?contaminant) on UA, CX 10/5.  02/2020. No known h/o diabetes. Placed on difulcan empiric.   PMH sig for severe dementia. 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 Family not involved.  Tyson Dense is  Eliot Ford at [phone] (not family).  Today "Thayden" is seen to proceed with left ureteroscopy for steons + cystolithalopexy. No interval fevers. Emergency authorization obatained form [employer].   Objective: Vital signs in last 24 hours: Temp:  [97.7 F (36.5 C)] 97.7 F (36.5 C) (10/08 0420) Pulse Rate:  [67-69] 69 (10/08 0420) Resp:  [15-17] 15 (10/08 0420) BP: (111-122)/(77-88) 122/88 (10/08 0420) SpO2:  [99 %] 99 % (10/08 0420) Weight:  [70.7 kg] 70.7 kg (10/08 0158)    Intake/Output from previous day: 10/07 0701 - 10/08 0700 In: 660 [P.O.:660] Out: -  Intake/Output this shift: No intake/output data recorded.  Constitutional:      Comments: NAD, pleasant but severely demented in OR holding.  HENT:     Nose: Nose normal.  Eyes:     Pupils: Pupils are equal, round, and reactive to light.   Cardiovascular:     Rate and Rhythm: Normal rate.     Pulses: Normal pulses.  Pulmonary:     Effort: Pulmonary effort is normal.  Abdominal:     General: Abdomen is flat.  Genitourinary:    Penis: Normal.      Comments: No CVAT Musculoskeletal:     Cervical back: Normal range of motion.  Skin:    General: Skin is warm.  Neurological:     Mental Status: He is alert.   Lab Results:  Recent Labs    02/07/20 0229  WBC 9.6  HGB 12.4*  HCT 38.2*  PLT 192   BMET Recent Labs    02/07/20 0229 02/08/20 0130  NA 142 142  K 3.6 3.5  CL 107 106  CO2 23 26  GLUCOSE 103* 121*  BUN 25* 18  CREATININE 1.91* 1.65*  CALCIUM 8.7* 9.0   PT/INR No results for input(s): LABPROT, INR in the last 72 hours. ABG No results for input(s): PHART, HCO3 in the last 72 hours.  Invalid input(s): PCO2, PO2  Studies/Results: No results found.  Anti-infectives: Anti-infectives (From admission, onward)   Start     Dose/Rate Route Frequency Ordered Stop   02/09/20 1400  [MAR Hold]  cefTRIAXone (ROCEPHIN) 1 g in sodium chloride 0.9 % 100 mL IVPB        (MAR Hold since Fri 02/09/2020 at 1418.Hold Reason: Transfer to a Procedural area.)   1 g  200 mL/hr over 30 Minutes Intravenous Every 24 hours 02/09/20 1316     02/08/20 1030  cefTRIAXone (ROCEPHIN) 1 g in sodium chloride 0.9 % 100 mL IVPB  Status:  Discontinued        1 g 200 mL/hr over 30 Minutes Intravenous Every 24 hours 02/08/20 1017 02/08/20 1035   02/06/20 1945  [MAR Hold]  fluconazole (DIFLUCAN) tablet 100 mg        (MAR Hold since Fri 02/09/2020 at 1418.Hold Reason: Transfer to a Procedural area.)   100 mg Oral Daily 02/06/20 1937        Assessment/Plan: Proceed as planned with cysto, cystolithalopexy, Left ureteroscopy / stent exchange for stones and fiailure to thrive. Discussed goals of surgery over three convorsations with pt's guardian Ted Mcalpine who adamantly wants to rpoceed. We discussed htat stone removal may or may not  help his FTT as that could also be soley result of his prgressive dementia. Risks, ebenefits, alternatives, expected peri-op course discussed again today.   Sebastian Ache 02/09/2020

## 2020-02-09 NOTE — Anesthesia Procedure Notes (Signed)
Procedure Name: LMA Insertion Date/Time: 02/09/2020 4:44 PM Performed by: Gwenyth Allegra, CRNA Pre-anesthesia Checklist: Patient identified, Emergency Drugs available, Suction available, Patient being monitored and Timeout performed Patient Re-evaluated:Patient Re-evaluated prior to induction Preoxygenation: Pre-oxygenation with 100% oxygen Induction Type: IV induction Ventilation: Mask ventilation without difficulty LMA: LMA inserted LMA Size: 4.0 Number of attempts: 1 Placement Confirmation: positive ETCO2 and breath sounds checked- equal and bilateral Tube secured with: Tape Dental Injury: Teeth and Oropharynx as per pre-operative assessment

## 2020-02-09 NOTE — Anesthesia Postprocedure Evaluation (Signed)
Anesthesia Post Note  Patient: Gerald Diaz  Procedure(s) Performed: CYSTOSCOPY WITH RETROGRADE PYELOGRAM, URETEROSCOPY AND STENT EXCHANGE (Left ) HOLMIUM LASER APPLICATION (Left ) CYSTOSCOPY WITH LITHOLAPAXY (N/A )     Patient location during evaluation: PACU Anesthesia Type: General Level of consciousness: awake and alert Pain management: pain level controlled Vital Signs Assessment: post-procedure vital signs reviewed and stable Respiratory status: spontaneous breathing, nonlabored ventilation, respiratory function stable and patient connected to nasal cannula oxygen Cardiovascular status: blood pressure returned to baseline and stable Postop Assessment: no apparent nausea or vomiting Anesthetic complications: no   No complications documented.  Last Vitals:  Vitals:   02/09/20 1930 02/09/20 2007  BP: (!) 133/94 106/89  Pulse: (!) 106 82  Resp: 18 18  Temp: (!) 36.1 C 37.1 C  SpO2: 97% 98%    Last Pain:  Vitals:   02/09/20 2007  TempSrc: Oral  PainSc:                  Rhiana Morash COKER

## 2020-02-09 NOTE — Discharge Instructions (Signed)
1 - You may have urinary urgency (bladder spasms) and bloody urine on / off with stent in place. This is normal. ° °2 - Call MD or go to ER for fever >102, severe pain / nausea / vomiting not relieved by medications, or acute change in medical status ° °

## 2020-02-09 NOTE — Brief Op Note (Signed)
02/09/2020  6:33 PM  PATIENT:  Gerald Diaz  64 y.o. male  PRE-OPERATIVE DIAGNOSIS:  LEFT URETERAL STONES AND BLADDER STONES  POST-OPERATIVE DIAGNOSIS:  LEFT URETERAL STONES AND BLADDER STONES  PROCEDURE:  Procedure(s) with comments: CYSTOSCOPY WITH RETROGRADE PYELOGRAM, URETEROSCOPY AND STENT EXCHANGE (Left) - 75 MINS HOLMIUM LASER APPLICATION (Left) CYSTOSCOPY WITH LITHOLAPAXY (N/A)  SURGEON:  Surgeon(s) and Role:    Sebastian Ache, MD - Primary  PHYSICIAN ASSISTANT:   ASSISTANTS: none   ANESTHESIA:   general  EBL:  minimal   BLOOD ADMINISTERED:none  DRAINS: none   LOCAL MEDICATIONS USED:  NONE  SPECIMEN:  Source of Specimen:  Left ureteral / renal stone fragments, bladder stone fragments.   DISPOSITION OF SPECIMEN:  [facility] for compositional analysis (ureteral stone fragments)  COUNTS:  YES  TOURNIQUET:  * No tourniquets in log *  DICTATION: .Other Dictation: Dictation Number  [id number]  PLAN OF CARE: Admit to inpatient   PATIENT DISPOSITION:  PACU - hemodynamically stable.   Delay start of Pharmacological VTE agent (>24hrs) due to surgical blood loss or risk of bleeding: yes

## 2020-02-09 NOTE — Progress Notes (Signed)
Pharmacy Antibiotic Note  Gerald Diaz is a 64 y.o. male admitted on 02/06/2020 with Funguria.  Pharmacy has been consulted for Fluconazole dosing.  ID: AF, WBC 9.6. Incidental funguria on UA. Scr 1.65 down (solitary kidney)  10/5 fluconazole>> COVID neg 10/5 urine>> 40K staph hemolyticus - ox resistant 10/5: UA: budding yeast  Plan: Fluconazole 100mg  PO  daily Micromedex recommends 14d for urogenital site.    Weight: 70.7 kg (155 lb 13.8 oz)  Temp (24hrs), Avg:97.7 F (36.5 C), Min:97.7 F (36.5 C), Max:97.7 F (36.5 C)  Recent Labs  Lab 02/06/20 1001 02/07/20 0229 02/08/20 0130  WBC 10.8* 9.6  --   CREATININE 2.26* 1.91* 1.65*    CrCl cannot be calculated (Unknown ideal weight.).    No Known Allergies   Soriyah Osberg S. 04/09/20, PharmD, BCPS Clinical Staff Pharmacist [email or web address] Merilynn Finland Stillinger 02/09/2020 1:28 PM

## 2020-02-09 NOTE — Progress Notes (Signed)
   02/09/20 2007  Vitals  Temp 98.8 F (37.1 C)  Temp Source Oral  BP 106/89  MAP (mmHg) 97  BP Location Left Arm  BP Method Automatic  Patient Position (if appropriate) Lying  Pulse Rate 82  Pulse Rate Source Dinamap  Resp 18  MEWS COLOR  MEWS Score Color Green  Oxygen Therapy  SpO2 98 %  O2 Device Room Air  MEWS Score  MEWS Temp 0  MEWS Systolic 0  MEWS Pulse 0  MEWS RR 0  MEWS LOC 1  MEWS Score 1  Pt arrived from PACU, alert and confuse,oriented X1, attempting OOB and trying to pull out IV, Morphine 2mg  given for pain.

## 2020-02-09 NOTE — Transfer of Care (Signed)
Immediate Anesthesia Transfer of Care Note  Patient: Gerald Diaz  Procedure(s) Performed: CYSTOSCOPY WITH RETROGRADE PYELOGRAM, URETEROSCOPY AND STENT EXCHANGE (Left ) HOLMIUM LASER APPLICATION (Left ) CYSTOSCOPY WITH LITHOLAPAXY (N/A )  Patient Location: PACU  Anesthesia Type:General  Level of Consciousness: drowsy  Airway & Oxygen Therapy: Patient Spontanous Breathing  Post-op Assessment: Report given to RN and Post -op Vital signs reviewed and stable  Post vital signs: Reviewed and stable  Last Vitals:  Vitals Value Taken Time  BP 118/88 02/09/20 1857  Temp    Pulse 81 02/09/20 1858  Resp 12 02/09/20 1858  SpO2 100 % 02/09/20 1858  Vitals shown include unvalidated device data.  Last Pain:  Vitals:   02/09/20 0420  TempSrc: Oral  PainSc:          Complications: No complications documented.

## 2020-02-09 NOTE — Anesthesia Preprocedure Evaluation (Signed)
Anesthesia Evaluation  Patient identified by MRN, date of birth, ID band Patient awake    Reviewed: Allergy & Precautions, NPO status , Patient's Chart, lab work & pertinent test results  Airway Mallampati: II  TM Distance: >3 FB Neck ROM: Full    Dental no notable dental hx.    Pulmonary neg pulmonary ROS, former smoker,    Pulmonary exam normal breath sounds clear to auscultation       Cardiovascular negative cardio ROS Normal cardiovascular exam Rhythm:Regular Rate:Normal     Neuro/Psych PSYCHIATRIC DISORDERS Dementia negative neurological ROS     GI/Hepatic negative GI ROS, Neg liver ROS,   Endo/Other  negative endocrine ROS  Renal/GU ARFRenal disease  negative genitourinary   Musculoskeletal negative musculoskeletal ROS (+)   Abdominal   Peds negative pediatric ROS (+)  Hematology negative hematology ROS (+)   Anesthesia Other Findings   Reproductive/Obstetrics negative OB ROS                             Anesthesia Physical  Anesthesia Plan  ASA: III  Anesthesia Plan: General   Post-op Pain Management:    Induction: Intravenous  PONV Risk Score and Plan: 2 and Ondansetron, Dexamethasone and Treatment may vary due to age or medical condition  Airway Management Planned: LMA  Additional Equipment:   Intra-op Plan:   Post-operative Plan: Extubation in OR  Informed Consent: I have reviewed the patients History and Physical, chart, labs and discussed the procedure including the risks, benefits and alternatives for the proposed anesthesia with the patient or authorized representative who has indicated his/her understanding and acceptance.     Dental advisory given  Plan Discussed with: CRNA and Surgeon  Anesthesia Plan Comments:         Anesthesia Quick Evaluation

## 2020-02-10 ENCOUNTER — Encounter (HOSPITAL_COMMUNITY): Payer: Self-pay | Admitting: Urology

## 2020-02-10 NOTE — Discharge Summary (Signed)
Physician Discharge Summary  Gerald Diaz TKZ:[id number] DOB: 04-Dec-1955 DOA: 02/06/2020  PCP: [name], MD  Admit date: 02/06/2020 Discharge date: 02/10/2020  Admitted From:home Disposition:  home Recommendations for Outpatient Follow-up:  1. Follow up with PCP in 1-2 weeks 2. Please obtain BMP/CBC in one week 3. Please follow up with urology  Home Health:none Equipment/Devices:none Discharge Condition:stable CODE STATUS:full Diet recommendation:cardiac Brief/Interim Summary:Gerald H Lynahis a 64 y.o.malewith medical history significant ofadvanced dementia who was recently hospitalized from 9/2-6 with AKI and 9 mm stone in proximal left ureter in his solitary kidney; Dr. Alvester Morin was consulted and performed cystoscopy with IVP and stent placement.He has seen urology in the interim(9/27) with plan for outpatient ureteroscopy with laser lithotripsy for stone removal; caregivers have not had further f/u about when this may be performed and are frustrated about this; he is scheduled to see urology again on Nov. 12. He has been complaining of his stomach feeling funny, not eating. 176 ->147 pounds since last admission. Complained of abdominal pain today - but midepigastric in nature, it appears. This AM he fell at home and hit his head, progressive weakness. No fever. In the ED it was noted patient was in AKI and has new hydronephrosis with new  stones urology consulted.   Discharge Diagnoses:  Principal Problem:   Ureteral stone with hydronephrosis Active Problems:   Dementia (HCC)   AKI (acute kidney injury) (HCC)   S/p nephrectomy   #1 left hydronephrosis with new ureteral stones in patient with history of right nephrectomy. S/P cystoscopy with retrograde pyelogram, ureteroscopy and stent exchange, cystoscopy with litholapaxy.  Patient had CT abdomen 01/14/2020 with 9 mm stone in the proximal left ureter followed by which she had a cystoscopy on 01/05/2020 and left ureteral  stent placement by Dr. Alvester Morin. He followed with [facility] on 01/29/2020 and was planning to have ureteroscopy with laser lithotripsy.  Meanwhile he came to [facility] and now with new left mid ureter stone with hydroureteronephrosis.  Patient had persistent abdominal pain weight loss cachexia and falls prior to admission.  #2 AKI-RESOLVED WITH FLUIDS  #3 history of dementia patient lives at home with 24-hour care.  Continue Zoloft.  Nutrition Problem: Inadequate oral intake Etiology: poor appetite (abdominal pain)    Signs/Symptoms: per patient/family report, energy intake < 75% for > or equal to 1 month     Interventions: Ensure Enlive (each supplement provides 350kcal and 20 grams of protein), Magic cup, MVI  Estimated body mass index is 21.14 kg/m as calculated from the following:   Height as of 06/17/16: 6' (1.829 m).   Weight as of this encounter: 70.7 kg.  Discharge Instructions  Discharge Instructions    Diet - low sodium heart healthy   Complete by: As directed    Increase activity slowly   Complete by: As directed      Allergies as of 02/10/2020   No Known Allergies     Medication List    TAKE these medications   sertraline 25 MG tablet Commonly known as: ZOLOFT Take 25 mg by mouth daily.       Follow-up Information    Sebastian Ache, MD On 03/04/2020.   Specialty: Urology Why: at 1:15 for MD visit and office stent removal Contact information: [address] [place] [address] [phone]        [name], MD Follow up.   Specialties: Internal Medicine, Geriatric Medicine Contact information: [address] [place] [address] [phone]  No Known Allergies  Consultations:  UROLOGY   Procedures/Studies: CT Abdomen Pelvis Wo Contrast  Result Date: 02/06/2020 CLINICAL DATA:  Flank pain.  Confusion. EXAM: CT ABDOMEN AND PELVIS WITHOUT CONTRAST TECHNIQUE: Multidetector CT imaging of the abdomen and pelvis was  performed following the standard protocol without IV contrast. COMPARISON:  CT scan 01/04/2020 FINDINGS: Lower chest: The lung bases are clear of acute process. No pleural effusion or pulmonary lesions. The heart is normal in size. No pericardial effusion. The distal esophagus and aorta are unremarkable. Hepatobiliary: No hepatic lesions are identified without contrast. The gallbladder appears normal. No common bile duct dilatation. Pancreas: No mass, inflammation or ductal dilatation. Spleen: Normal size.  No focal lesions. Adrenals/Urinary Tract: The adrenal glands are unremarkable. The right kidney is surgically absent. The left kidney contains a double-J ureteral stent. Mild hydroureteronephrosis noted. There are 2 adjacent calculi in the left mid ureter at the L4 level. Both calculi measure approximately 7 mm in length. I do not see any distal ureteral calculi. There are small layering calculi in the bladder and a larger calculus in the left ladder near the loop of the double-J stent. This measures 5.5 mm. Stomach/Bowel: The stomach, duodenum, small bowel and colon are grossly normal without oral contrast. No inflammatory changes, mass lesions or obstructive findings. The appendix is normal. Vascular/Lymphatic: Stable scattered vascular calcifications. No abdominal or pelvic lymphadenopathy. Reproductive: Mild prostate gland enlargement with median lobe hypertrophy impressing on the base of the bladder. The seminal vesicles are unremarkable. Other: No pelvic mass or adenopathy. No free pelvic fluid collections. No inguinal mass or adenopathy. No abdominal wall hernia or subcutaneous lesions. Musculoskeletal: No significant bony findings. IMPRESSION: 1. Two adjacent calculi in the left mid ureter at the L4 level. Both calculi measure approximately 7 mm in length. 2. Mild left-sided hydroureteronephrosis. 3. Status post right nephrectomy. 4. Stable bladder calculi. 5. No other significant abdominal/pelvic  findings and no mass lesions or adenopathy. 6. Aortic atherosclerosis. Aortic Atherosclerosis (ICD10-I70.0). Electronically Signed   By: Rudie Meyer M.D.   On: 02/06/2020 15:00   CT Head Wo Contrast  Result Date: 02/06/2020 CLINICAL DATA:  Larey Seat.  Hit head.  Altered mental status. EXAM: CT HEAD WITHOUT CONTRAST CT CERVICAL SPINE WITHOUT CONTRAST TECHNIQUE: Multidetector CT imaging of the head and cervical spine was performed following the standard protocol without intravenous contrast. Multiplanar CT image reconstructions of the cervical spine were also generated. COMPARISON:  Brain MRI 10/15/2010 FINDINGS: CT HEAD FINDINGS Brain: Mild age advanced cerebral atrophy, ventriculomegaly and periventricular white matter disease. No extra-axial fluid collections are identified. No CT findings for acute hemispheric infarction or intracranial hemorrhage. No mass lesions. The brainstem and cerebellum are normal. Vascular: Mild vascular calcifications but no aneurysm or hyperdense vessels. Skull: No acute skull fracture or worrisome bone lesions. Sinuses/Orbits: The paranasal sinuses and mastoid air cells are clear. The globes are intact. Other: Small left frontal scalp hematoma but no foreign body or underlying fracture. CT CERVICAL SPINE FINDINGS Alignment: Normal. Skull base and vertebrae: No acute fracture. No primary bone lesion or focal pathologic process. The C2-3 facets are fused bilaterally. Soft tissues and spinal canal: No prevertebral fluid or swelling. No visible canal hematoma. Disc levels: The spinal canal is quite generous. No large disc protrusions, significant spinal or foraminal stenosis. Air-filled bone cyst noted in the C7 vertebral body. Upper chest: The visualized lung apices are grossly clear. Other: No neck mass or adenopathy. IMPRESSION: 1. Mild age advanced cerebral atrophy, ventriculomegaly and periventricular  white matter disease. 2. No acute intracranial findings or skull fracture. 3. Normal  alignment of the cervical vertebral bodies and no acute cervical spine fracture. Electronically Signed   By: Rudie Meyer M.D.   On: 02/06/2020 14:52   CT Cervical Spine Wo Contrast  Result Date: 02/06/2020 CLINICAL DATA:  Larey Seat.  Hit head.  Altered mental status. EXAM: CT HEAD WITHOUT CONTRAST CT CERVICAL SPINE WITHOUT CONTRAST TECHNIQUE: Multidetector CT imaging of the head and cervical spine was performed following the standard protocol without intravenous contrast. Multiplanar CT image reconstructions of the cervical spine were also generated. COMPARISON:  Brain MRI 10/15/2010 FINDINGS: CT HEAD FINDINGS Brain: Mild age advanced cerebral atrophy, ventriculomegaly and periventricular white matter disease. No extra-axial fluid collections are identified. No CT findings for acute hemispheric infarction or intracranial hemorrhage. No mass lesions. The brainstem and cerebellum are normal. Vascular: Mild vascular calcifications but no aneurysm or hyperdense vessels. Skull: No acute skull fracture or worrisome bone lesions. Sinuses/Orbits: The paranasal sinuses and mastoid air cells are clear. The globes are intact. Other: Small left frontal scalp hematoma but no foreign body or underlying fracture. CT CERVICAL SPINE FINDINGS Alignment: Normal. Skull base and vertebrae: No acute fracture. No primary bone lesion or focal pathologic process. The C2-3 facets are fused bilaterally. Soft tissues and spinal canal: No prevertebral fluid or swelling. No visible canal hematoma. Disc levels: The spinal canal is quite generous. No large disc protrusions, significant spinal or foraminal stenosis. Air-filled bone cyst noted in the C7 vertebral body. Upper chest: The visualized lung apices are grossly clear. Other: No neck mass or adenopathy. IMPRESSION: 1. Mild age advanced cerebral atrophy, ventriculomegaly and periventricular white matter disease. 2. No acute intracranial findings or skull fracture. 3. Normal alignment of the  cervical vertebral bodies and no acute cervical spine fracture. Electronically Signed   By: Rudie Meyer M.D.   On: 02/06/2020 14:52   DG Retrograde Pyelogram  Result Date: 02/09/2020 CLINICAL DATA:  Retrograde pyelogram EXAM: RETROGRADE PYELOGRAM; DG C-ARM 1-60 MIN COMPARISON:  CT dated 02/06/2020 FINDINGS: The patient has undergone retrograde pyelogram. Injection of contrast demonstrates filling defects within the mid to proximal ureter with mild hydroureteronephrosis. Subsequent images demonstrate placement of a double-J ureteral stent that appears grossly well positioned. The total fluoroscopy time was 36 seconds. A total of 3 images were submitted for evaluation. IMPRESSION: Status post stent placement as detailed above. Electronically Signed   By: Katherine Mantle M.D.   On: 02/09/2020 18:58   DG C-Arm 1-60 Min  Result Date: 02/09/2020 CLINICAL DATA:  Retrograde pyelogram EXAM: RETROGRADE PYELOGRAM; DG C-ARM 1-60 MIN COMPARISON:  CT dated 02/06/2020 FINDINGS: The patient has undergone retrograde pyelogram. Injection of contrast demonstrates filling defects within the mid to proximal ureter with mild hydroureteronephrosis. Subsequent images demonstrate placement of a double-J ureteral stent that appears grossly well positioned. The total fluoroscopy time was 36 seconds. A total of 3 images were submitted for evaluation. IMPRESSION: Status post stent placement as detailed above. Electronically Signed   By: Katherine Mantle M.D.   On: 02/09/2020 18:58    (Echo, Carotid, EGD, Colonoscopy, ERCP)    Subjective: RESTING CARE GIVER BY THE BED SIDE  Discharge Exam: Vitals:   02/10/20 0351 02/10/20 1019  BP: 112/83 105/65  Pulse: 79 81  Resp: 18 19  Temp: 98.2 F (36.8 C) 97.8 F (36.6 C)  SpO2: 99% 97%   Vitals:   02/09/20 2007 02/10/20 0114 02/10/20 0351 02/10/20 1019  BP: 106/89 110/85 112/83  105/65  Pulse: 82 80 79 81  Resp: 18 18 18 19   Temp: 98.8 F (37.1 C) 98.5 F (36.9 C)  98.2 F (36.8 C) 97.8 F (36.6 C)  TempSrc: Oral Oral Oral Axillary  SpO2: 98% 98% 99% 97%  Weight:        General: Pt is RESTING IN NAD Cardiovascular: RRR, S1/S2 +, no rubs, no gallops Respiratory: CTA bilaterally, no wheezing, no rhonchi Abdominal: Soft, NT, ND, bowel sounds + Extremities: no edema, no cyanosis    The results of significant diagnostics from this hospitalization (including imaging, microbiology, ancillary and laboratory) are listed below for reference.     Microbiology: Recent Results (from the past 240 hour(s))  Urine culture     Status: Abnormal   Collection Time: 02/06/20  4:12 PM   Specimen: Urine, Random  Result Value Ref Range Status   Specimen Description URINE, RANDOM  Final   Special Requests   Final    NONE Performed at [facility], [address] [place], [place] [id number]    Culture 40,000 COLONIES/mL STAPHYLOCOCCUS HAEMOLYTICUS (A)  Final   Report Status 02/08/2020 FINAL  Final   Organism ID, Bacteria STAPHYLOCOCCUS HAEMOLYTICUS (A)  Final      Susceptibility   Staphylococcus haemolyticus - MIC*    CIPROFLOXACIN >=8 RESISTANT Resistant     GENTAMICIN <=0.5 SENSITIVE Sensitive     NITROFURANTOIN <=16 SENSITIVE Sensitive     OXACILLIN >=4 RESISTANT Resistant     TETRACYCLINE 2 SENSITIVE Sensitive     VANCOMYCIN <=0.5 SENSITIVE Sensitive     TRIMETH/SULFA <=10 SENSITIVE Sensitive     CLINDAMYCIN >=8 RESISTANT Resistant     RIFAMPIN 1 SENSITIVE Sensitive     Inducible Clindamycin NEGATIVE Sensitive     * 40,000 COLONIES/mL STAPHYLOCOCCUS HAEMOLYTICUS  Respiratory Panel by RT PCR (Flu A&B, Covid) - Nasopharyngeal Swab     Status: None   Collection Time: 02/06/20  7:28 PM   Specimen: Nasopharyngeal Swab  Result Value Ref Range Status   SARS Coronavirus 2 by RT PCR NEGATIVE NEGATIVE Final    Comment: (NOTE) SARS-CoV-2 target nucleic acids are NOT DETECTED.  The SARS-CoV-2 RNA is generally detectable in upper respiratoy specimens  during the acute phase of infection. The lowest concentration of SARS-CoV-2 viral copies this assay can detect is 131 copies/mL. A negative result does not preclude SARS-Cov-2 infection and should not be used as the sole basis for treatment or other patient management decisions. A negative result may occur with  improper specimen collection/handling, submission of specimen other than nasopharyngeal swab, presence of viral mutation(s) within the areas targeted by this assay, and inadequate number of viral copies (<131 copies/mL). A negative result must be combined with clinical observations, patient history, and epidemiological information. The expected result is Negative.  Fact Sheet for Patients:  https://www.moore.com/https://www.fda.gov/media/142436/download  Fact Sheet for Healthcare Providers:  https://www.young.biz/https://www.fda.gov/media/142435/download  This test is no t yet approved or cleared by the [place] FDA and  has been authorized for detection and/or diagnosis of SARS-CoV-2 by FDA under an Emergency Use Authorization (EUA). This EUA will remain  in effect (meaning this test can be used) for the duration of the COVID-19 declaration under Section 564(b)(1) of the Act, 21 U.S.C. section 360bbb-3(b)(1), unless the authorization is terminated or revoked sooner.     Influenza A by PCR NEGATIVE NEGATIVE Final   Influenza B by PCR NEGATIVE NEGATIVE Final    Comment: (NOTE) The Xpert Xpress SARS-CoV-2/FLU/RSV assay is intended as  an aid in  the diagnosis of influenza from Nasopharyngeal swab specimens and  should not be used as a sole basis for treatment. Nasal washings and  aspirates are unacceptable for Xpert Xpress SARS-CoV-2/FLU/RSV  testing.  Fact Sheet for Patients: https://www.moore.com/  Fact Sheet for Healthcare Providers: https://www.young.biz/  This test is not yet approved or cleared by the [place] FDA and  has been authorized for detection and/or  diagnosis of SARS-CoV-2 by  FDA under an Emergency Use Authorization (EUA). This EUA will remain  in effect (meaning this test can be used) for the duration of the  Covid-19 declaration under Section 564(b)(1) of the Act, 21  U.S.C. section 360bbb-3(b)(1), unless the authorization is  terminated or revoked. Performed at [facility], [address], [place], [place] [address]   Surgical pcr screen     Status: Abnormal   Collection Time: 02/09/20  2:04 PM   Specimen: Nasal Mucosa; Nasal Swab  Result Value Ref Range Status   MRSA, PCR NEGATIVE NEGATIVE Final   Staphylococcus aureus POSITIVE (A) NEGATIVE Final    Comment: (NOTE) The Xpert SA Assay (FDA approved for NASAL specimens in patients 62 years of age and older), is one component of a comprehensive surveillance program. It is not intended to diagnose infection nor to guide or monitor treatment. Performed at [facility], [address] [place], [place] [address]      Labs: BNP (last 3 results) No results for input(s): BNP in the last 8760 hours. Basic Metabolic Panel: Recent Labs  Lab 02/06/20 1001 02/07/20 0229 02/08/20 0130  NA 144 142 142  K 4.2 3.6 3.5  CL 105 107 106  CO2 24 23 26   GLUCOSE 120* 103* 121*  BUN 27* 25* 18  CREATININE 2.26* 1.91* 1.65*  CALCIUM 9.7 8.7* 9.0   Liver Function Tests: Recent Labs  Lab 02/06/20 1001  AST 20  ALT 19  ALKPHOS 79  BILITOT 1.1  PROT 6.9  ALBUMIN 3.8   Recent Labs  Lab 02/06/20 1001  LIPASE 29   No results for input(s): AMMONIA in the last 168 hours. CBC: Recent Labs  Lab 02/06/20 1001 02/07/20 0229  WBC 10.8* 9.6  HGB 15.4 12.4*  HCT 46.0 38.2*  MCV 97.5 97.7  PLT 234 192   Cardiac Enzymes: No results for input(s): CKTOTAL, CKMB, CKMBINDEX, TROPONINI in the last 168 hours. BNP: Invalid input(s): POCBNP CBG: No results for input(s): GLUCAP in the last 168 hours. D-Dimer No results for input(s): DDIMER in the last 72 hours. Hgb  A1c No results for input(s): HGBA1C in the last 72 hours. Lipid Profile No results for input(s): CHOL, HDL, LDLCALC, TRIG, CHOLHDL, LDLDIRECT in the last 72 hours. Thyroid function studies No results for input(s): TSH, T4TOTAL, T3FREE, THYROIDAB in the last 72 hours.  Invalid input(s): FREET3 Anemia work up No results for input(s): VITAMINB12, FOLATE, FERRITIN, TIBC, IRON, RETICCTPCT in the last 72 hours. Urinalysis    Component Value Date/Time   COLORURINE AMBER (A) 02/06/2020 1001   APPEARANCEUR CLOUDY (A) 02/06/2020 1001   LABSPEC 1.017 02/06/2020 1001   LABSPEC 1.015 03/13/2009 1428   PHURINE 5.0 02/06/2020 1001   GLUCOSEU 50 (A) 02/06/2020 1001   HGBUR LARGE (A) 02/06/2020 1001   BILIRUBINUR NEGATIVE 02/06/2020 1001   KETONESUR 20 (A) 02/06/2020 1001   PROTEINUR 100 (A) 02/06/2020 1001   NITRITE NEGATIVE 02/06/2020 1001   LEUKOCYTESUR SMALL (A) 02/06/2020 1001   Sepsis Labs Invalid input(s): PROCALCITONIN,  WBC,  LACTICIDVEN Microbiology Recent Results (from the past 240 hour(s))  Urine culture     Status: Abnormal   Collection Time: 02/06/20  4:12 PM   Specimen: Urine, Random  Result Value Ref Range Status   Specimen Description URINE, RANDOM  Final   Special Requests   Final    NONE Performed at [facility], [address], [place], [place] [address]    Culture 40,000 COLONIES/mL STAPHYLOCOCCUS HAEMOLYTICUS (A)  Final   Report Status 02/08/2020 FINAL  Final   Organism ID, Bacteria STAPHYLOCOCCUS HAEMOLYTICUS (A)  Final      Susceptibility   Staphylococcus haemolyticus - MIC*    CIPROFLOXACIN >=8 RESISTANT Resistant     GENTAMICIN <=0.5 SENSITIVE Sensitive     NITROFURANTOIN <=16 SENSITIVE Sensitive     OXACILLIN >=4 RESISTANT Resistant     TETRACYCLINE 2 SENSITIVE Sensitive     VANCOMYCIN <=0.5 SENSITIVE Sensitive     TRIMETH/SULFA <=10 SENSITIVE Sensitive     CLINDAMYCIN >=8 RESISTANT Resistant     RIFAMPIN 1 SENSITIVE Sensitive     Inducible  Clindamycin NEGATIVE Sensitive     * 40,000 COLONIES/mL STAPHYLOCOCCUS HAEMOLYTICUS  Respiratory Panel by RT PCR (Flu A&B, Covid) - Nasopharyngeal Swab     Status: None   Collection Time: 02/06/20  7:28 PM   Specimen: Nasopharyngeal Swab  Result Value Ref Range Status   SARS Coronavirus 2 by RT PCR NEGATIVE NEGATIVE Final    Comment: (NOTE) SARS-CoV-2 target nucleic acids are NOT DETECTED.  The SARS-CoV-2 RNA is generally detectable in upper respiratoy specimens during the acute phase of infection. The lowest concentration of SARS-CoV-2 viral copies this assay can detect is 131 copies/mL. A negative result does not preclude SARS-Cov-2 infection and should not be used as the sole basis for treatment or other patient management decisions. A negative result may occur with  improper specimen collection/handling, submission of specimen other than nasopharyngeal swab, presence of viral mutation(s) within the areas targeted by this assay, and inadequate number of viral copies (<131 copies/mL). A negative result must be combined with clinical observations, patient history, and epidemiological information. The expected result is Negative.  Fact Sheet for Patients:  https://www.moore.com/  Fact Sheet for Healthcare Providers:  https://www.young.biz/  This test is no t yet approved or cleared by the [place] FDA and  has been authorized for detection and/or diagnosis of SARS-CoV-2 by FDA under an Emergency Use Authorization (EUA). This EUA will remain  in effect (meaning this test can be used) for the duration of the COVID-19 declaration under Section 564(b)(1) of the Act, 21 U.S.C. section 360bbb-3(b)(1), unless the authorization is terminated or revoked sooner.     Influenza A by PCR NEGATIVE NEGATIVE Final   Influenza B by PCR NEGATIVE NEGATIVE Final    Comment: (NOTE) The Xpert Xpress SARS-CoV-2/FLU/RSV assay is intended as an aid in  the  diagnosis of influenza from Nasopharyngeal swab specimens and  should not be used as a sole basis for treatment. Nasal washings and  aspirates are unacceptable for Xpert Xpress SARS-CoV-2/FLU/RSV  testing.  Fact Sheet for Patients: https://www.moore.com/  Fact Sheet for Healthcare Providers: https://www.young.biz/  This test is not yet approved or cleared by the [place] FDA and  has been authorized for detection and/or diagnosis of SARS-CoV-2 by  FDA under an Emergency Use Authorization (EUA). This EUA will remain  in effect (meaning this test can be used) for the duration of the  Covid-19 declaration under Section 564(b)(1)  of the Act, 21  U.S.C. section 360bbb-3(b)(1), unless the authorization is  terminated or revoked. Performed at [facility], [address] [place], [place] [address]   Surgical pcr screen     Status: Abnormal   Collection Time: 02/09/20  2:04 PM   Specimen: Nasal Mucosa; Nasal Swab  Result Value Ref Range Status   MRSA, PCR NEGATIVE NEGATIVE Final   Staphylococcus aureus POSITIVE (A) NEGATIVE Final    Comment: (NOTE) The Xpert SA Assay (FDA approved for NASAL specimens in patients 89 years of age and older), is one component of a comprehensive surveillance program. It is not intended to diagnose infection nor to guide or monitor treatment. Performed at [facility], [address] [place], [place] [address]      Time coordinating discharge:  39 minutes  SIGNED:   Alwyn Ren, MD  [facility] 02/10/2020, 10:35 AM

## 2020-02-10 NOTE — Op Note (Signed)
NAMEEMARI, DEMMER MEDICAL RECORD [id number] ACCOUNT [id number] DATE OF BIRTH:[birth year] FACILITY: [facility] LOCATION: [facility] PHYSICIAN:Hamdan Toscano Berneice Heinrich, MD  OPERATIVE REPORT  DATE OF PROCEDURE:  02/09/2020  SURGEON:  Sebastian Ache, MD  PREOPERATIVE DIAGNOSIS:  Left ureteral renal stones and bladder stones with refractory colic and failure to thrive.  PROCEDURE: 1.  Cystolitholapaxy, stone less than 2.5 cm. 2.  Left ureteroscopy with laser lithotripsy. 3.  Left retrograde pyelogram interpretation. 4.  Exchange of left ureteral stent, 5 x 26 Polaris, no tether.  ESTIMATED BLOOD LOSS:  Nil.  COMPLICATIONS:  None.  SPECIMENS:   1.  Bladder stones for discard. 2.  Left ureteral and renal stone fragments for composition analysis.  FINDINGS: 1.  Approximately 2 cm total volume bladder stone, dominant stone approximately 1 cm. 2.  Complete resolution of all bladder stones following laser cystolitholapaxy and irrigation. 3.  Multifocal left ureteral and renal stones, quite large volume, total volume approximately 1.5 cm. 4.  Complete resolution of all accessible stone fragments larger than one-third mm following laser lithotripsy and basket extraction on the left side. 5.  Successful replacement of left ureteral stent, proximal end in renal pelvis, distal end in urinary bladder.  INDICATIONS:  The patient is an unfortunate 64 year old man with history of rapidly progressive dementia.  He has a legal guardian, does not make independent decisions.  His family is uninvolved.  He was found last month to have a left ureteral stone,  large volume bladder stones and acute renal failure.  He was appropriately underwent stenting as a temporizing measure by a colleague of mine.  He represented to the hospital again with failure to thrive.  The stent was in good position.  The patient's  caregiver is quite concerned that his persistent colic is a source of his poor intake.  We discussed extensively  that this may or may not be a dominant factor, that his progressive dementia may also be exceptionally contributory, but certainly do agree  that the goal of stone free is warranted preferably during this hospitalization to minimize transfer back and forth and for cost efficiency and the guardian wished to proceed.  Informed consent was obtained and placed in the medical record.  DESCRIPTION OF PROCEDURE:  The patient being identified, the procedure being left ureteroscopic stone manipulation and  cystoscopically was confirmed.  Procedure timeout was performed.  Intravenous antibiotics administered.  General LMA anesthesia  induced.  The patient was placed into a low lithotomy position.  Sterile field was created, prepped and draped base of the penis, perineum and proximal thighs.  Cystourethroscopy was performed using a 21-French rigid cystoscope with offset lens.   Inspection of anterior and posterior urethra revealed some mild trilobar prostatic hypertrophy.  Inspection of bladder revealed multifocal bladder stones, most of which were incredibly small and amenable to simple irrigation.  This was done ____  approximately 1.5 cm.  This was not amenable to simple irrigation.  As such, using an open-ended catheter as a stabilizing mechanism, a 365 nanometer fiber was used and holmium laser energy applied at 70 setting of 0.3 joules and 30 Hz.  Using a partial  dusting and partial fragmentation technique, the stone was dusted and fragmented and pieces that were then amenable to cystoscopic irrigation.  Following this, there was complete resolution of all accessible bladder stone fragments.  The distal end of the left ureteral stent was  grasped, brought to the level of the urethral meatus.  A 0.03 ZIPwire was advanced to the lower  pole, exchanged for an open-ended catheter and left retrograde pyelogram was obtained.  Left retrograde pyelogram demonstrated a single left ureter, single system left kidney.   There were multifocal filling defects in the ureter consistent with known stone.  These were quite large.  A ZIPwire was once again advanced and set aside as a  safety wire.  An 8-French feeding tube was placed in the urinary bladder for pressure release and semirigid ureteroscopy was performed distal left ureter alongside a separate Sensor working wire.  There were 2 dominant foci of ureteral stone.  These were  each quite large at approximately 7-8 mm.  These were not amenable to simple basketing.  Holmium laser energy was then applied to these stones using settings of 0.2 joules and 30 Hz and approximately 50% of the stone was dusted, 80% fragmented.   Fragments were grasped in Escape basket, removed and set aside for composition analysis.  Semirigid ureteroscopy of the remainder of the distal four-fifths of the left ureter revealed no additional calcifications.  As the goal today was to render stone  free, the semirigid scope was exchanged for a 12/14, 35 cm ureteral access sheath to the level of the proximal ureter using continuous fluoroscopic guidance over a Sensor working wire and flexible digital ureteroscopy was performed the proximal left  ureter and systematic inspection left kidney and all calices x3.  There were multifocal stones, including several dominant lower pole stones.  These were much too large for simple basketing.  The Escape basket was then used and repositioned the stones  into the upper pole calix to allow for less acute angulation and holmium laser energy then applied to the stone with similar settings, again using approximately 50% dusting, 50% fragmenting technique and all fragments larger than one-third mm were  sequentially grasped with the Escape basket, removed and set aside for composition analysis.  Following this, complete resolution of all accessible stone fragments larger than one-third mm, there was excellent hemostasis.  No evidence of renal  perforation.  The access  sheath was removed under continuous vision.  No significant mucosal abnormalities were found.  Given the very large stone volume, it was clearly felt that interval stenting with a nontethered stent would be warranted to allow  interval passage of any small debris and a new 5 x 26 Polaris-type stent was placed over remaining safety wire using fluoroscopic guidance.  Good proximal and distal planes were noted and the procedure was terminated.  The patient tolerated the procedure  well.  There were no immediate perioperative complications.  The patient was taken to the postanesthesia care unit in stable condition with plan for continued inpatient admission, hopefully discharge home tomorrow pending his clinical status.  VN/NUANCE  D:02/09/2020 T:02/10/2020 JOB:[id number]

## 2020-02-10 NOTE — Progress Notes (Signed)
Patient discharged to home with instructions given to private caregiver and also faxed a copy of his discharge summary to Eliot Ford, the [employer]. Patient was transported by caregiver on duty as per home care agency.

## 2020-02-13 ENCOUNTER — Other Ambulatory Visit: Payer: Self-pay

## 2020-02-13 ENCOUNTER — Ambulatory Visit: Payer: Medicare Other | Attending: Internal Medicine

## 2020-02-13 DIAGNOSIS — Z23 Encounter for immunization: Secondary | ICD-10-CM

## 2020-02-13 NOTE — Progress Notes (Signed)
   Covid-19 Vaccination Clinic  Name:  Gerald Diaz    MRN: [id number] DOB: 04/13/1956  02/13/2020  Mr. [name] was observed post Covid-19 immunization for 15 minutes without incident. He was provided with Vaccine Information Sheet and instruction to access the V-Safe system.   Mr. [name] was instructed to call 911 with any severe reactions post vaccine: Marland Kitchen Difficulty breathing  . Swelling of face and throat  . A fast heartbeat  . A bad rash all over body  . Dizziness and weakness   Immunizations Administered    Name Date Dose VIS Date Route   Moderna COVID-19 Vaccine 02/13/2020 10:30 AM 0.5 mL 04/2019 Intramuscular   Manufacturer: Moderna   Lot: [id number]   NDC: 72094-709-62

## 2020-02-17 ENCOUNTER — Emergency Department (HOSPITAL_COMMUNITY)
Admission: EM | Admit: 2020-02-17 | Discharge: 2020-02-18 | Disposition: A | Discharge status: Home / Self Care | Payer: Medicare Other | Attending: Emergency Medicine | Admitting: Emergency Medicine

## 2020-02-17 ENCOUNTER — Other Ambulatory Visit: Payer: Self-pay

## 2020-02-17 ENCOUNTER — Encounter (HOSPITAL_COMMUNITY): Payer: Self-pay | Admitting: Emergency Medicine

## 2020-02-17 DIAGNOSIS — Z87891 Personal history of nicotine dependence: Secondary | ICD-10-CM | POA: Diagnosis not present

## 2020-02-17 DIAGNOSIS — F0390 Unspecified dementia without behavioral disturbance: Secondary | ICD-10-CM | POA: Diagnosis not present

## 2020-02-17 DIAGNOSIS — R638 Other symptoms and signs concerning food and fluid intake: Secondary | ICD-10-CM | POA: Insufficient documentation

## 2020-02-17 DIAGNOSIS — R410 Disorientation, unspecified: Secondary | ICD-10-CM | POA: Insufficient documentation

## 2020-02-17 LAB — BASIC METABOLIC PANEL
Anion gap: 11 (ref 5–15)
BUN: 10 mg/dL (ref 8–23)
CO2: 28 mmol/L (ref 22–32)
Calcium: 8.8 mg/dL — ABNORMAL LOW (ref 8.9–10.3)
Chloride: 101 mmol/L (ref 98–111)
Creatinine, Ser: 1.42 mg/dL — ABNORMAL HIGH (ref 0.61–1.24)
GFR, Estimated: 51 mL/min — ABNORMAL LOW (ref >60)
Glucose, Bld: 98 mg/dL (ref 70–99)
Potassium: 3.5 mmol/L (ref 3.5–5.1)
Sodium: 140 mmol/L (ref 135–145)

## 2020-02-17 LAB — CBC
HCT: 41.6 % (ref 39.0–52.0)
Hemoglobin: 13.4 g/dL (ref 13.0–17.0)
MCH: 32.4 pg (ref 26.0–34.0)
MCHC: 32.2 g/dL (ref 30.0–36.0)
MCV: 100.7 fL — ABNORMAL HIGH (ref 80.0–100.0)
Platelets: 267 10*3/uL (ref 150–400)
RBC: 4.13 MIL/uL — ABNORMAL LOW (ref 4.22–5.81)
RDW: 12.4 % (ref 11.5–15.5)
WBC: 12 10*3/uL — ABNORMAL HIGH (ref 4.0–10.5)
nRBC: 0 % (ref 0.0–0.2)

## 2020-02-17 LAB — URINALYSIS, ROUTINE W REFLEX MICROSCOPIC
Bilirubin Urine: NEGATIVE
Glucose, UA: NEGATIVE mg/dL
Ketones, ur: 5 mg/dL — AB
Nitrite: NEGATIVE
Protein, ur: 30 mg/dL — AB
Specific Gravity, Urine: 1.012 (ref 1.005–1.030)
pH: 5 (ref 5.0–8.0)

## 2020-02-17 MED ORDER — SODIUM CHLORIDE 0.9 % IV BOLUS
1000.0000 mL | Freq: Once | INTRAVENOUS | Status: AC
  Administered 2020-02-17: 1000 mL via INTRAVENOUS

## 2020-02-17 NOTE — ED Notes (Signed)
When rounding patient was left alone no caregiver in the room .REPORT TO EMILY RN ,.NURSE STATED THAT HE HAS A CAREGIVER WITH HIM A ROUND THE CLOCK.CAREGIVER HAS LEFT .PATIENT IS AT THE NURSES STATION WITH STAFF;'WAITING ON CAREGIVER

## 2020-02-17 NOTE — ED Notes (Signed)
Called and legal guardian d/t pt's caregiver being nowhere to be seen. She is calling the home care agency and then calling this assignment phone number back

## 2020-02-17 NOTE — ED Notes (Signed)
Per caregiver, pt hasn't been eating x couple days.

## 2020-02-17 NOTE — ED Provider Notes (Signed)
[facility] EMERGENCY DEPARTMENT Provider Note   CSN: [id number] Arrival date & time: 02/17/20  1134  LEVEL 5 CAVEAT - DEMENTIA  History Chief Complaint  Patient presents with  . decreased PO intake    Gerald Diaz is a 64 y.o. male.  HPI 64 year old male presents with caregiver for concern of decreased p.o. intake.  They report from caregiver services that over the last couple days he has not been eating and drinking very much.  He is not vomiting or complaining of anything but seems to just not want to eat.  About a week ago he had ureteral stent placement for ureteral stone.  No fevers, vomiting.  Caregiver reports he ate and drank a small amount in the waiting room.  Past Medical History:  Diagnosis Date  . Anemia   . Dementia (HCC)   . Nephrolithiasis     Patient Active Problem List   Diagnosis Date Noted  . AKI (acute kidney injury) (HCC) 02/06/2020  . 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 Dementia (HCC) 01/05/2020  . Ureteral stone with hydronephrosis   . ARF (acute renal failure) (HCC) 01/04/2020    Past Surgical History:  Procedure Laterality Date  . CYSTOSCOPY W/ URETERAL STENT PLACEMENT Left 01/05/2020   Procedure: CYSTOSCOPY WITH RETROGRADE PYELOGRAM/URETERAL STENT PLACEMENT;  Surgeon: Crista Elliot, MD;  Location: [facility] ORS;  Service: Urology;  Laterality: Left;  . CYSTOSCOPY WITH LITHOLAPAXY N/A 02/09/2020   Procedure: CYSTOSCOPY WITH LITHOLAPAXY;  Surgeon: Sebastian Ache, MD;  Location[facility] OR;  Service: Urology;  Laterality: N/A;  . CYSTOSCOPY WITH RETROGRADE PYELOGRAM, URETEROSCOPY AND STENT PLACEMENT Left 02/09/2020   Procedure: CYSTOSCOPY WITH RETROGRADE PYELOGRAM, URETEROSCOPY AND STENT EXCHANGE;  Surgeon: Sebastian Ache, MD;  Location: [facility] OR;  Service: Urology;  Laterality: Left;  75 MINS  . HOLMIUM LASER APPLICATION Left 02/09/2020   Procedure: HOLMIUM LASER APPLICATION;  Surgeon: Sebastian Ache, MD;  Location: [facility] OR;  Service: Urology;   Laterality: Left;  . NEPHRECTOMY     pt was 10       Family History  Family history unknown: Yes    Social History   Tobacco Use  . Smoking status: Former Games developer  . Smokeless tobacco: Never Used  . Tobacco comment: history generally unknown  Substance Use Topics  . Alcohol use: No  . Drug use: No    Home Medications Prior to Admission medications   Medication Sig Start Date End Date Taking? Authorizing Provider  sertraline (ZOLOFT) 25 MG tablet Take 25 mg by mouth daily. 01/01/20   [provider]    Allergies    Patient has no known allergies.  Review of Systems   Review of Systems  Unable to perform ROS: Dementia    Physical Exam Updated Vital Signs BP (!) 109/95 (BP Location: Right Arm)   Pulse 69   Temp 97.8 F (36.6 C) (Oral)   Resp 15   SpO2 100%   Physical Exam Vitals and nursing note reviewed.  Constitutional:      General: He is not in acute distress.    Appearance: He is well-developed. He is not ill-appearing or diaphoretic.  HENT:     Head: Normocephalic and atraumatic.     Right Ear: External ear normal.     Left Ear: External ear normal.     Nose: Nose normal.  Eyes:     General:        Right eye: No discharge.  Left eye: No discharge.  Cardiovascular:     Rate and Rhythm: Normal rate and regular rhythm.     Heart sounds: Normal heart sounds.  Pulmonary:     Effort: Pulmonary effort is normal.     Breath sounds: Normal breath sounds.  Abdominal:     Palpations: Abdomen is soft.     Tenderness: There is no abdominal tenderness.  Musculoskeletal:     Cervical back: Neck supple.  Skin:    General: Skin is warm and dry.  Neurological:     Mental Status: He is alert.     Comments: Awake, alert, pleasantly confused. CN 3-12 grossly intact. 5/5 strength in all 4 extremities. Grossly normal sensation  Psychiatric:        Mood and Affect: Mood is not anxious.     ED Results / Procedures / Treatments   Labs (all labs  ordered are listed, but only abnormal results are displayed) Labs Reviewed  BASIC METABOLIC PANEL - Abnormal; Notable for the following components:      Result Value   Creatinine, Ser 1.42 (*)    Calcium 8.8 (*)    GFR, Estimated 51 (*)    All other components within normal limits  CBC - Abnormal; Notable for the following components:   WBC 12.0 (*)    RBC 4.13 (*)    MCV 100.7 (*)    All other components within normal limits  URINALYSIS, ROUTINE W REFLEX MICROSCOPIC - Abnormal; Notable for the following components:   APPearance HAZY (*)    Hgb urine dipstick MODERATE (*)    Ketones, ur 5 (*)    Protein, ur 30 (*)    Leukocytes,Ua SMALL (*)    Bacteria, UA FEW (*)    All other components within normal limits  URINE CULTURE    EKG None  Radiology No results found.  Procedures Procedures (including critical care time)  Medications Ordered in ED Medications  sodium chloride 0.9 % bolus 1,000 mL (has no administration in time range)    ED Course  I have reviewed the triage vital signs and the nursing notes.  Pertinent labs & imaging results that were available during my care of the patient were reviewed by me and considered in my medical decision making (see chart for details).    MDM Rules/Calculators/A&P                          Patient's labs have been reviewed and his kidney function is at his baseline.  He has a mild leukocytosis of unclear etiology as he shows no other signs or symptoms of infection.  When given food he would drink a little bit of fluid but did not seem to eat until I literally handed the cracker to him and then he ate without difficulty.  I do not think acute abdominal imaging is needed.  I discussed with caregiver so they need to encourage more directly for him to eat.  He was given a fluid bolus but otherwise he appears stable for discharge home.  His urinalysis is equivocal but does not really appear like an infection.  We will send a urine  culture. Final Clinical Impression(s) / ED Diagnoses Final diagnoses:  Decreased oral intake    Rx / DC Orders ED Discharge Orders    None       Pricilla Loveless, MD 02/17/20 2303

## 2020-02-17 NOTE — ED Triage Notes (Signed)
Pt here with caregiver that states he had kidney stone surgery 1 week ago and believes that pt is dehydrated due to decreased PO intake.  Pt denies any complaints.  History of dementia.

## 2020-02-17 NOTE — ED Notes (Signed)
Collected urine for UA, also sent down save tube for urine culture.

## 2020-02-17 NOTE — ED Notes (Signed)
Pt provided with graham crackers and a coke to drink. Pt stated he did not want a [place] sandwich.

## 2020-02-17 NOTE — ED Notes (Signed)
Patient's caregiver stated he "needed to leave to get the keys to the house"

## 2020-02-18 NOTE — ED Notes (Signed)
Pt's caregiver just came back and said he left to go get the house keys.

## 2020-02-19 LAB — URINE CULTURE: Culture: NO GROWTH

## 2021-09-23 ENCOUNTER — Emergency Department (HOSPITAL_COMMUNITY): Payer: Medicare HMO

## 2021-09-23 ENCOUNTER — Inpatient Hospital Stay (HOSPITAL_COMMUNITY)
Admission: EM | Admit: 2021-09-23 | Discharge: 2021-10-02 | DRG: 309 | Disposition: A | Discharge status: Skilled Nursing Facility | Payer: Medicare HMO | Attending: Internal Medicine | Admitting: Internal Medicine

## 2021-09-23 ENCOUNTER — Encounter (HOSPITAL_COMMUNITY): Payer: Self-pay | Admitting: Emergency Medicine

## 2021-09-23 ENCOUNTER — Inpatient Hospital Stay (HOSPITAL_COMMUNITY): Payer: Medicare HMO

## 2021-09-23 DIAGNOSIS — Z781 Physical restraint status: Secondary | ICD-10-CM | POA: Diagnosis not present

## 2021-09-23 DIAGNOSIS — F0390 Unspecified dementia without behavioral disturbance: Secondary | ICD-10-CM | POA: Diagnosis present

## 2021-09-23 DIAGNOSIS — R55 Syncope and collapse: Secondary | ICD-10-CM | POA: Diagnosis not present

## 2021-09-23 DIAGNOSIS — R7303 Prediabetes: Secondary | ICD-10-CM | POA: Diagnosis present

## 2021-09-23 DIAGNOSIS — I951 Orthostatic hypotension: Secondary | ICD-10-CM | POA: Diagnosis present

## 2021-09-23 DIAGNOSIS — I480 Paroxysmal atrial fibrillation: Secondary | ICD-10-CM

## 2021-09-23 DIAGNOSIS — R569 Unspecified convulsions: Secondary | ICD-10-CM | POA: Diagnosis present

## 2021-09-23 DIAGNOSIS — A419 Sepsis, unspecified organism: Secondary | ICD-10-CM

## 2021-09-23 DIAGNOSIS — F0150 Vascular dementia without behavioral disturbance: Secondary | ICD-10-CM | POA: Diagnosis not present

## 2021-09-23 DIAGNOSIS — Z87891 Personal history of nicotine dependence: Secondary | ICD-10-CM

## 2021-09-23 DIAGNOSIS — R6510 Systemic inflammatory response syndrome (SIRS) of non-infectious origin without acute organ dysfunction: Secondary | ICD-10-CM | POA: Diagnosis present

## 2021-09-23 DIAGNOSIS — I4891 Unspecified atrial fibrillation: Secondary | ICD-10-CM | POA: Diagnosis present

## 2021-09-23 DIAGNOSIS — R739 Hyperglycemia, unspecified: Secondary | ICD-10-CM | POA: Diagnosis present

## 2021-09-23 DIAGNOSIS — Z87442 Personal history of urinary calculi: Secondary | ICD-10-CM

## 2021-09-23 DIAGNOSIS — Z20822 Contact with and (suspected) exposure to covid-19: Secondary | ICD-10-CM | POA: Diagnosis present

## 2021-09-23 DIAGNOSIS — Z905 Acquired absence of kidney: Secondary | ICD-10-CM

## 2021-09-23 DIAGNOSIS — R000 Tachycardia, unspecified: Secondary | ICD-10-CM | POA: Diagnosis present

## 2021-09-23 DIAGNOSIS — R32 Unspecified urinary incontinence: Secondary | ICD-10-CM | POA: Diagnosis present

## 2021-09-23 HISTORY — DX: Paroxysmal atrial fibrillation: I48.0

## 2021-09-23 LAB — LACTIC ACID, PLASMA
Lactic Acid, Venous: 4.1 mmol/L (ref 0.5–1.9)
Lactic Acid, Venous: 1.7 mmol/L (ref 0.5–1.9)

## 2021-09-23 LAB — URINALYSIS, ROUTINE W REFLEX MICROSCOPIC
Bilirubin Urine: NEGATIVE
Glucose, UA: NEGATIVE mg/dL
Ketones, ur: NEGATIVE mg/dL
Nitrite: POSITIVE — AB
Protein, ur: NEGATIVE mg/dL
Specific Gravity, Urine: 1.003 — ABNORMAL LOW (ref 1.005–1.030)
pH: 7 (ref 5.0–8.0)

## 2021-09-23 LAB — PROTIME-INR
INR: 1 (ref 0.8–1.2)
Prothrombin Time: 13.3 seconds (ref 11.4–15.2)

## 2021-09-23 LAB — CBC WITH DIFFERENTIAL/PLATELET
Abs Immature Granulocytes: 0.05 10*3/uL (ref 0.00–0.07)
Basophils Absolute: 0 10*3/uL (ref 0.0–0.1)
Basophils Relative: 0 %
Eosinophils Absolute: 0.2 10*3/uL (ref 0.0–0.5)
Eosinophils Relative: 2 %
HCT: 39.5 % (ref 39.0–52.0)
Hemoglobin: 13.3 g/dL (ref 13.0–17.0)
Immature Granulocytes: 1 %
Lymphocytes Relative: 12 %
Lymphs Abs: 1.2 10*3/uL (ref 0.7–4.0)
MCH: 33.8 pg (ref 26.0–34.0)
MCHC: 33.7 g/dL (ref 30.0–36.0)
MCV: 100.3 fL — ABNORMAL HIGH (ref 80.0–100.0)
Monocytes Absolute: 0.4 10*3/uL (ref 0.1–1.0)
Monocytes Relative: 4 %
Neutro Abs: 7.9 10*3/uL — ABNORMAL HIGH (ref 1.7–7.7)
Neutrophils Relative %: 81 %
Platelets: 159 10*3/uL (ref 150–400)
RBC: 3.94 MIL/uL — ABNORMAL LOW (ref 4.22–5.81)
RDW: 12.5 % (ref 11.5–15.5)
WBC: 9.8 10*3/uL (ref 4.0–10.5)
nRBC: 0 % (ref 0.0–0.2)

## 2021-09-23 LAB — COMPREHENSIVE METABOLIC PANEL
ALT: 15 U/L (ref 0–44)
AST: 19 U/L (ref 15–41)
Albumin: 3.4 g/dL — ABNORMAL LOW (ref 3.5–5.0)
Alkaline Phosphatase: 91 U/L (ref 38–126)
Anion gap: 9 (ref 5–15)
BUN: 15 mg/dL (ref 8–23)
CO2: 22 mmol/L (ref 22–32)
Calcium: 8.8 mg/dL — ABNORMAL LOW (ref 8.9–10.3)
Chloride: 109 mmol/L (ref 98–111)
Creatinine, Ser: 1.22 mg/dL (ref 0.61–1.24)
GFR, Estimated: >60 mL/min (ref >60)
Glucose, Bld: 190 mg/dL — ABNORMAL HIGH (ref 70–99)
Potassium: 3.7 mmol/L (ref 3.5–5.1)
Sodium: 140 mmol/L (ref 135–145)
Total Bilirubin: 0.9 mg/dL (ref 0.3–1.2)
Total Protein: 6 g/dL — ABNORMAL LOW (ref 6.5–8.1)

## 2021-09-23 LAB — APTT: aPTT: 25 seconds (ref 24–36)

## 2021-09-23 LAB — HEMOGLOBIN A1C
Hgb A1c MFr Bld: 5.8 % — ABNORMAL HIGH (ref 4.8–5.6)
Mean Plasma Glucose: 119.76 mg/dL

## 2021-09-23 LAB — PROCALCITONIN: Procalcitonin: <0.1 ng/mL

## 2021-09-23 LAB — MAGNESIUM: Magnesium: 2.1 mg/dL (ref 1.7–2.4)

## 2021-09-23 LAB — RESP PANEL BY RT-PCR (FLU A&B, COVID) ARPGX2
Influenza A by PCR: NEGATIVE
Influenza B by PCR: NEGATIVE
SARS Coronavirus 2 by RT PCR: NEGATIVE

## 2021-09-23 LAB — TROPONIN I (HIGH SENSITIVITY)
Troponin I (High Sensitivity): 4 ng/L (ref <18)
Troponin I (High Sensitivity): 7 ng/L (ref <18)

## 2021-09-23 LAB — TSH: TSH: 1.923 u[IU]/mL (ref 0.350–4.500)

## 2021-09-23 LAB — CBG MONITORING, ED: Glucose-Capillary: 173 mg/dL — ABNORMAL HIGH (ref 70–99)

## 2021-09-23 MED ORDER — ACETAMINOPHEN 500 MG PO TABS: 500.0000 mg | ORAL_TABLET | ORAL | Status: DC | PRN

## 2021-09-23 MED ORDER — SODIUM CHLORIDE 0.9 % IV SOLN
2.0000 g | INTRAVENOUS | Status: DC
  Administered 2021-09-23 – 2021-09-24 (×2): 2 g via INTRAVENOUS
  Filled 2021-09-23 (×2): qty 20

## 2021-09-23 MED ORDER — HALOPERIDOL LACTATE 5 MG/ML IJ SOLN: 5.0000 mg | Freq: Four times a day (QID) | INTRAMUSCULAR | Status: DC | PRN

## 2021-09-23 MED ORDER — RISPERIDONE 0.5 MG PO TABS
0.5000 mg | ORAL_TABLET | Freq: Two times a day (BID) | ORAL | Status: DC
  Administered 2021-09-23 – 2021-10-02 (×18): 0.5 mg via ORAL
  Filled 2021-09-23 (×19): qty 1

## 2021-09-23 MED ORDER — SODIUM CHLORIDE 0.9% FLUSH: 3.0000 mL | INTRAVENOUS | Status: DC | PRN

## 2021-09-23 MED ORDER — SODIUM CHLORIDE 0.9 % IV BOLUS (SEPSIS)
2000.0000 mL | Freq: Once | INTRAVENOUS | Status: AC
  Administered 2021-09-23: 2000 mL via INTRAVENOUS

## 2021-09-23 MED ORDER — SODIUM CHLORIDE 0.9 % IV SOLN
500.0000 mg | INTRAVENOUS | Status: DC
  Administered 2021-09-23 – 2021-09-24 (×2): 500 mg via INTRAVENOUS
  Filled 2021-09-23 (×2): qty 5

## 2021-09-23 MED ORDER — SODIUM CHLORIDE 0.9% FLUSH
3.0000 mL | Freq: Two times a day (BID) | INTRAVENOUS | Status: DC
  Administered 2021-09-23 – 2021-10-02 (×19): 3 mL via INTRAVENOUS

## 2021-09-23 MED ORDER — SERTRALINE HCL 50 MG PO TABS
25.0000 mg | ORAL_TABLET | Freq: Every day | ORAL | Status: DC
  Administered 2021-09-24 – 2021-10-02 (×9): 25 mg via ORAL
  Filled 2021-09-23 (×9): qty 1

## 2021-09-23 MED ORDER — TRAZODONE HCL 50 MG PO TABS
50.0000 mg | ORAL_TABLET | Freq: Every day | ORAL | Status: DC
  Administered 2021-09-23 – 2021-10-01 (×9): 50 mg via ORAL
  Filled 2021-09-23 (×9): qty 1

## 2021-09-23 MED ORDER — ACETAMINOPHEN 325 MG PO TABS: 650.0000 mg | ORAL_TABLET | ORAL | Status: DC | PRN

## 2021-09-23 MED ORDER — LORATADINE 10 MG PO TABS
10.0000 mg | ORAL_TABLET | Freq: Every day | ORAL | Status: DC
  Administered 2021-09-24 – 2021-10-02 (×9): 10 mg via ORAL
  Filled 2021-09-23 (×9): qty 1

## 2021-09-23 MED ORDER — POTASSIUM CHLORIDE 10 MEQ/100ML IV SOLN
10.0000 meq | INTRAVENOUS | Status: AC
  Administered 2021-09-23 (×2): 10 meq via INTRAVENOUS
  Filled 2021-09-23 (×2): qty 100

## 2021-09-23 MED ORDER — DILTIAZEM HCL-DEXTROSE 125-5 MG/125ML-% IV SOLN (PREMIX)
5.0000 mg/h | INTRAVENOUS | Status: DC
  Administered 2021-09-23 – 2021-09-24 (×2): 5 mg/h via INTRAVENOUS
  Filled 2021-09-23 (×2): qty 125

## 2021-09-23 MED ORDER — ASPIRIN 81 MG PO TBEC
81.0000 mg | DELAYED_RELEASE_TABLET | Freq: Every day | ORAL | Status: DC
  Administered 2021-09-23 – 2021-10-02 (×10): 81 mg via ORAL
  Filled 2021-09-23 (×10): qty 1

## 2021-09-23 MED ORDER — DILTIAZEM LOAD VIA INFUSION
10.0000 mg | Freq: Once | INTRAVENOUS | Status: DC
  Filled 2021-09-23: qty 10

## 2021-09-23 MED ORDER — SODIUM CHLORIDE 0.9 % IV BOLUS (SEPSIS)
1500.0000 mL | Freq: Once | INTRAVENOUS | Status: AC
  Administered 2021-09-23: 1500 mL via INTRAVENOUS

## 2021-09-23 MED ORDER — DIGOXIN 0.25 MG/ML IJ SOLN
0.2500 mg | Freq: Four times a day (QID) | INTRAMUSCULAR | Status: AC
  Administered 2021-09-23 – 2021-09-24 (×3): 0.25 mg via INTRAVENOUS
  Filled 2021-09-23 (×4): qty 2

## 2021-09-23 MED ORDER — SODIUM CHLORIDE 0.9 % IV SOLN: 250.0000 mL | INTRAVENOUS | Status: DC | PRN

## 2021-09-23 MED ORDER — GUAIFENESIN-DM 100-10 MG/5ML PO SYRP: 10.0000 mL | ORAL_SOLUTION | ORAL | Status: DC | PRN

## 2021-09-23 MED ORDER — ENOXAPARIN SODIUM 40 MG/0.4ML IJ SOSY
40.0000 mg | PREFILLED_SYRINGE | INTRAMUSCULAR | Status: DC
  Administered 2021-09-23 – 2021-10-01 (×9): 40 mg via SUBCUTANEOUS
  Filled 2021-09-23 (×9): qty 0.4

## 2021-09-23 MED ORDER — ONDANSETRON HCL 4 MG/2ML IJ SOLN: 4.0000 mg | Freq: Four times a day (QID) | INTRAMUSCULAR | Status: DC | PRN

## 2021-09-23 NOTE — Sepsis Progress Note (Signed)
eLink monitoring code sepsis.  

## 2021-09-23 NOTE — ED Triage Notes (Signed)
Pt arrives via EMS from [facility] with reports of a syncopal episode while eating breakfast and some seizure like activity. Per facility, no previous seizure history. Hx of dementia. Pt disoriented x4, only says "no" at this time.

## 2021-09-23 NOTE — TOC Progression Note (Signed)
Transition of Care [facility]) - Progression Note    Patient Details  Name: Gerald Diaz MRN: [id number] Date of Birth: 1955/06/23  [facility] CM/SW Contact  Leone Haven, RN Phone Number: 09/23/2021, 4:44 PM  Clinical Narrative:    Patient is from [facility] ,has Legal Guardian Eliot Ford with [employer], [id number]. Here with syncope and collapse, afib, sepsis.  TOC will continue to follow for dc needs.   The New Legal Guardian is [employer] [phone].        Expected Discharge Plan and Services                                                 Social Determinants of Health (SDOH) Interventions    Readmission Risk Interventions    02/09/2020   11:58 AM  Readmission Risk Prevention Plan  Post Dischage Appt Not Complete  Appt Comments d/c date tbd  Medication Screening Complete  Transportation Screening Complete

## 2021-09-23 NOTE — H&P (Signed)
History and Physical    Gerald Diaz [id number] DOB: 05-31-1955 DOA: 09/23/2021  PCP: [name], MD (Confirm with patient/family/NH records and if not entered, this has to be entered at [facility] point of entry) Patient coming from: Assisted living  I have personally briefly reviewed patient's old medical records in [place]  Chief Complaint: Patient non-verbal  HPI: Gerald Diaz is a 66 y.o. 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  Patient has baseline dementia, although he is awake but does not provide any history.  All history by ED staff and report from assisted living facility.  This morning, patient passed out at breakfast time, and reportedly has some seizure-like activity of limbs shaking.  Recover consciousness by himself.  No history of seizure.  Does have history of PSVT but no history of A-fib.  ED Course: Patient was found to be in rapid A-fib, blood pressure borderline low.  No hypoxia afebrile.  Chest x-ray shows mild bilateral pulmonary congestion but no focal infiltrates or consolidation.  Blood work creatinine 1.2, glucose 190, K3.7.  UA positive nitrite, WBC 0-5/hpf  Patient was given total of 3.5 L of IV bolus and ceftriaxone and azithromycin to cover possible pneumonia and/or UTI.  Patient was started on Cardizem drip.  During the ED stay, patient started pulling IV and monitoring wires, soft restraints applied.    Review of Systems: Unable to perform, baseline dementia  Past Medical History:  Diagnosis Date   Anemia    Dementia ([place])    Nephrolithiasis     Past Surgical History:  Procedure Laterality Date   CYSTOSCOPY W/ URETERAL STENT PLACEMENT Left 01/05/2020   Procedure: CYSTOSCOPY WITH RETROGRADE PYELOGRAM/URETERAL STENT PLACEMENT;  Surgeon: Lucas Mallow, MD;  Location: [facility];  Service:  Urology;  Laterality: Left;   CYSTOSCOPY WITH LITHOLAPAXY N/A 02/09/2020   Procedure: CYSTOSCOPY WITH LITHOLAPAXY;  Surgeon: Alexis Frock, MD;  Location: [place];  Service: Urology;  Laterality: N/A;   CYSTOSCOPY WITH RETROGRADE PYELOGRAM, URETEROSCOPY AND STENT PLACEMENT Left 02/09/2020   Procedure: CYSTOSCOPY WITH RETROGRADE PYELOGRAM, URETEROSCOPY AND STENT EXCHANGE;  Surgeon: Alexis Frock, MD;  Location: [place];  Service: Urology;  Laterality: Left;  75 MINS   HOLMIUM LASER APPLICATION Left [id number]   Procedure: HOLMIUM LASER APPLICATION;  Surgeon: Alexis Frock, MD;  Location: [place];  Service: Urology;  Laterality: Left;   NEPHRECTOMY     pt was 10     reports that he has quit smoking. He has never used smokeless tobacco. He reports that he does not drink alcohol and does not use drugs.  No Known Allergies  Family History  Family history unknown: Yes     Prior to Admission medications   Medication Sig Start Date End Date Taking? Authorizing Provider  sertraline (ZOLOFT) 25 MG tablet Take 25 mg by mouth daily. 01/01/20   [provider]    Physical Exam: Vitals:   09/23/21 1130 09/23/21 1145 09/23/21 1221 09/23/21 1230  BP: (!) 142/87 97/86 101/79 98/68  Pulse: (!) 130  (!) 143 (!) 117  Resp: 16 11 13 11   Temp:      TempSrc:      SpO2: 98% 98% 96% 98%  Weight:      Height:        Constitutional: NAD, calm, comfortable Vitals:   09/23/21 1130 09/23/21 1145 09/23/21 1221  09/23/21 1230  BP: (!) 142/87 97/86 101/79 98/68  Pulse: (!) 130  (!) 143 (!) 117  Resp: 16 11 13 11   Temp:      TempSrc:      SpO2: 98% 98% 96% 98%  Weight:      Height:       Eyes: PERRL, lids and conjunctivae normal ENMT: Mucous membranes are moist. Posterior pharynx clear of any exudate or lesions.Normal dentition.  Neck: normal, supple, no masses, no thyromegaly Respiratory: clear to auscultation bilaterally, no wheezing, no crackles. Normal respiratory effort. No accessory  muscle use.  Cardiovascular: Irregular heart rate, tachycardia, no murmurs / rubs / gallops. No extremity edema. 2+ pedal pulses. No carotid bruits.  Abdomen: no tenderness, no masses palpated. No hepatosplenomegaly. Bowel sounds positive.  Musculoskeletal: no clubbing / cyanosis. No joint deformity upper and lower extremities. Good ROM, no contractures. Normal muscle tone.  Skin: no rashes, lesions, ulcers. No induration Neurologic: No facial droops, moving all limbs, not following commands Psychiatric: Awake, alert, nonverbal  ( Labs on Admission: I have personally reviewed following labs and imaging studies  CBC: Recent Labs  Lab 09/23/21 1006  WBC 9.8  NEUTROABS 7.9*  HGB 13.3  HCT 39.5  MCV 100.3*  PLT [id number]   Basic Metabolic Panel: Recent Labs  Lab 09/23/21 1006  NA 140  K 3.7  CL 109  CO2 22  GLUCOSE 190*  BUN 15  CREATININE 1.22  CALCIUM 8.8*  MG 2.1   GFR: Estimated Creatinine Clearance: 57.4 mL/min (by C-G formula based on SCr of 1.22 mg/dL). Liver Function Tests: Recent Labs  Lab 09/23/21 1006  AST 19  ALT 15  ALKPHOS 91  BILITOT 0.9  PROT 6.0*  ALBUMIN 3.4*   No results for input(s): LIPASE, AMYLASE in the last 168 hours. No results for input(s): AMMONIA in the last 168 hours. Coagulation Profile: Recent Labs  Lab 09/23/21 1006  INR 1.0   Cardiac Enzymes: No results for input(s): CKTOTAL, CKMB, CKMBINDEX, TROPONINI in the last 168 hours. BNP (last 3 results) No results for input(s): PROBNP in the last 8760 hours. HbA1C: No results for input(s): HGBA1C in the last 72 hours. CBG: Recent Labs  Lab 09/23/21 1008  GLUCAP 173*   Lipid Profile: No results for input(s): CHOL, HDL, LDLCALC, TRIG, CHOLHDL, LDLDIRECT in the last 72 hours. Thyroid Function Tests: No results for input(s): TSH, T4TOTAL, FREET4, T3FREE, THYROIDAB in the last 72 hours. Anemia Panel: No results for input(s): VITAMINB12, FOLATE, FERRITIN, TIBC, IRON, RETICCTPCT in the  last 72 hours. Urine analysis:    Component Value Date/Time   COLORURINE COLORLESS (A) 09/23/2021 1109   APPEARANCEUR CLEAR 09/23/2021 1109   LABSPEC 1.003 (L) 09/23/2021 1109   LABSPEC 1.015 03/13/2009 1428   PHURINE 7.0 09/23/2021 1109   GLUCOSEU NEGATIVE 09/23/2021 1109   HGBUR SMALL (A) 09/23/2021 1109   BILIRUBINUR NEGATIVE 09/23/2021 1109   KETONESUR NEGATIVE 09/23/2021 1109   PROTEINUR NEGATIVE 09/23/2021 1109   NITRITE POSITIVE (A) 09/23/2021 1109   LEUKOCYTESUR TRACE (A) 09/23/2021 1109    Radiological Exams on Admission: DG Chest Port 1 View  Result Date: 09/23/2021 CLINICAL DATA:  Questionable sepsis - evaluate for abnormality EXAM: PORTABLE CHEST 1 VIEW COMPARISON:  CT abdomen pelvis dated February 06, 2020; spine radiograph dated February 11, 2009 FINDINGS: There is an enlarged cardiomediastinal silhouette. Loss of the AP window. No pleural effusion or pneumothorax. Streaky reticulonodular opacities of bilateral lung bases. Linear opacity along the RIGHT superior paramediastinal border.  Gaseous distension of the stomach. Postsurgical changes of the LEFT clavicle. IMPRESSION: 1. Enlarged cardiomediastinal silhouette with loss of the AP window. This could be secondary to technique. Consider further dedicated evaluation with repeat PA and lateral chest radiograph versus cross-sectional imaging. 2. Streaky bibasilar reticulonodular opacities with a linear opacity of the RIGHT paramediastinal border. This could reflect infection, aspiration or atelectasis. Electronically Signed   By: Valentino Saxon M.D.   On: 09/23/2021 10:26    EKG: Independently reviewed.  A-fib, no acute ST changes.  Assessment/Plan Principal Problem:   A-fib [facility]) Active Problems:   Dementia ([place])   New onset a-fib ([place])  (please populate well all problems here in Problem List. (For example, if patient is on BP meds at home and you resume or decide to hold them, it is a problem that needs to be her. Same  for CAD, COPD, HLD and so on)  A-fib with RVR -Blood pressure borderline low, cautious with Cardizem drip, currently, infusion at 5 milligrams per hour.  Discussed with ED nurse at bedside. -Given the chest x-ray evidence of lung congestion, suspected underlying diastolic dysfunction With uncontrolled A-fib causing decompensation of early CHF, start digoxin loading 0.25 mg every 6 hours x4.  Other rate control measures if heart rate remains uncontrolled or BP unable to stabilize under Cardizem. -CHADS2=0, start aspirin.  Further evaluation of echocardiogram and A1c pending. -K = 7, will give potassium IVPGB x2, magnesium= 2.1  Syncope -Suspect vasovagal syncope related to temporary hypotension from a new onset of A-fib.  Received a total of 3.5 L IV bolus, will hold off further maintenance IV fluids given the chest x-ray showed. -Orthostatic vital signs once heart rate controlled -Check TSH -DDx, given there is a clear new onset A-fib and no history of seizure in this past, new onset seizure less likely.  But will start seizure precaution.  SIRS -Secondary to new onset A-fib -Low suspicion for sepsis, plan to check procalcitonin trending, if stable consider discontinue antibiotics.  Elevated lactate -As above  Elevated glucose -No history of diabetes, will check A1c  Delirium -Check CT head  Dementia -Unknown CODE STATUS -Left message to contact person and waiting for reply   DVT prophylaxis: Lovenox Code Status: Full Code Family Communication: Left message to contact person Disposition Plan: Patient sick with new onset A-fib with RVR requiring IV rate control medications, expect more than 2 midnight hospital stay Consults called: None Admission status: PCU   Lequita Halt MD [facility] Pager [phone]  09/23/2021, 12:44 PM

## 2021-09-23 NOTE — ED Provider Notes (Signed)
[facility] EMERGENCY DEPARTMENT Provider Note   CSN: [id number] Arrival date & time: 09/23/21  [id number]     History  Chief Complaint  Patient presents with   Seizures   Loss of Consciousness    Gerald Diaz is a 66 y.o. male.   Seizures Loss of Consciousness Associated symptoms: seizures   Associated symptoms: no fever      Patient has history of kidney stones as well as dementia.  Patient is unable to provide any history.  He looks at me but will not answer any my questions.  Patient was at his nursing facility when he had a syncopal episode while eating breakfast.  They noted some shaking activity following the event.  No known prior history of seizures or syncope. Home Medications Prior to Admission medications   Medication Sig Start Date End Date Taking? Authorizing Provider  sertraline (ZOLOFT) 25 MG tablet Take 25 mg by mouth daily. 01/01/20   [provider]      Allergies    Patient has no known allergies.    Review of Systems   Review of Systems  Constitutional:  Negative for fever.  Cardiovascular:  Positive for syncope.  Neurological:  Positive for seizures.   Physical Exam Updated Vital Signs BP 97/86   Pulse (!) 130   Temp (!) 96.7 F (35.9 C) (Rectal)   Resp 11   Ht 1.829 m (6')   Wt 70 kg   SpO2 98%   BMI 20.93 kg/m  Physical Exam Vitals and nursing note reviewed.  Constitutional:      Appearance: He is well-developed. He is not diaphoretic.  HENT:     Head: Normocephalic and atraumatic.     Right Ear: External ear normal.     Left Ear: External ear normal.  Eyes:     General: No scleral icterus.       Right eye: No discharge.        Left eye: No discharge.     Conjunctiva/sclera: Conjunctivae normal.  Neck:     Trachea: No tracheal deviation.  Cardiovascular:     Rate and Rhythm: Tachycardia present. Rhythm irregular.  Pulmonary:     Effort: Pulmonary effort is normal. No respiratory distress.     Breath  sounds: Normal breath sounds. No stridor. No wheezing or rales.  Abdominal:     General: Bowel sounds are normal. There is no distension.     Palpations: Abdomen is soft.     Tenderness: There is no abdominal tenderness. There is no guarding or rebound.  Musculoskeletal:        General: No tenderness or deformity.     Cervical back: Neck supple.  Skin:    General: Skin is warm and dry.     Findings: No rash.  Neurological:     Mental Status: He is alert.     Cranial Nerves: No cranial nerve deficit (no facial droop, extraocular movements intact,).     Sensory: No sensory deficit.     Motor: No abnormal muscle tone or seizure activity.     Coordination: Coordination normal.     Comments: Does not follow any commands, he does move his extremities, he does look at me when I speak to him, he does not verbally answer any questions  Psychiatric:        Mood and Affect: Mood normal.    ED Results / Procedures / Treatments   Labs (all labs ordered are listed, but only abnormal  results are displayed) Labs Reviewed  LACTIC ACID, PLASMA - Abnormal; Notable for the following components:      Result Value   Lactic Acid, Venous 4.1 (*)    All other components within normal limits  COMPREHENSIVE METABOLIC PANEL - Abnormal; Notable for the following components:   Glucose, Bld 190 (*)    Calcium 8.8 (*)    Total Protein 6.0 (*)    Albumin 3.4 (*)    All other components within normal limits  CBC WITH DIFFERENTIAL/PLATELET - Abnormal; Notable for the following components:   RBC 3.94 (*)    MCV 100.3 (*)    Neutro Abs 7.9 (*)    All other components within normal limits  URINALYSIS, ROUTINE W REFLEX MICROSCOPIC - Abnormal; Notable for the following components:   Color, Urine COLORLESS (*)    Specific Gravity, Urine 1.003 (*)    Hgb urine dipstick SMALL (*)    Nitrite POSITIVE (*)    Leukocytes,Ua TRACE (*)    Bacteria, UA RARE (*)    All other components within normal limits  CBG  MONITORING, ED - Abnormal; Notable for the following components:   Glucose-Capillary 173 (*)    All other components within normal limits  CULTURE, BLOOD (ROUTINE X 2)  CULTURE, BLOOD (ROUTINE X 2)  URINE CULTURE  PROTIME-INR  APTT  MAGNESIUM  LACTIC ACID, PLASMA  TROPONIN I (HIGH SENSITIVITY)  TROPONIN I (HIGH SENSITIVITY)    EKG EKG Interpretation  Date/Time:  Tuesday Sep 23 2021 09:46:57 EDT Ventricular Rate:  155 PR Interval:    QRS Duration: 96 QT Interval:  311 QTC Calculation: 500 R Axis:   56 Text Interpretation: Atrial fibrillation with rapid V-rate Minimal ST depression Borderline T abnormalities, inferior leads  a fib new since last tracing Reconfirmed by Dorie Rank [phone]) on 09/23/2021 10:04:18 AM  Radiology DG Chest Port 1 View  Result Date: 09/23/2021 CLINICAL DATA:  Questionable sepsis - evaluate for abnormality EXAM: PORTABLE CHEST 1 VIEW COMPARISON:  CT abdomen pelvis dated February 06, 2020; spine radiograph dated February 11, 2009 FINDINGS: There is an enlarged cardiomediastinal silhouette. Loss of the AP window. No pleural effusion or pneumothorax. Streaky reticulonodular opacities of bilateral lung bases. Linear opacity along the RIGHT superior paramediastinal border. Gaseous distension of the stomach. Postsurgical changes of the LEFT clavicle. IMPRESSION: 1. Enlarged cardiomediastinal silhouette with loss of the AP window. This could be secondary to technique. Consider further dedicated evaluation with repeat PA and lateral chest radiograph versus cross-sectional imaging. 2. Streaky bibasilar reticulonodular opacities with a linear opacity of the RIGHT paramediastinal border. This could reflect infection, aspiration or atelectasis. Electronically Signed   By: Valentino Saxon M.D.   On: 09/23/2021 10:26    Procedures .Critical Care Performed by: Dorie Rank, MD Authorized by: Dorie Rank, MD   Critical care provider statement:    Critical care time (minutes):   30   Critical care was time spent personally by me on the following activities:  Development of treatment plan with patient or surrogate, discussions with consultants, evaluation of patient's response to treatment, examination of patient, ordering and review of laboratory studies, ordering and review of radiographic studies, ordering and performing treatments and interventions, pulse oximetry, re-evaluation of patient's condition and review of old charts    Medications Ordered in ED Medications  sodium chloride 0.9 % bolus 1,500 mL (1,500 mLs Intravenous New Bag/Given 09/23/21 1145)  cefTRIAXone (ROCEPHIN) 2 g in sodium chloride 0.9 % 100 mL IVPB (2 g Intravenous  New Bag/Given 09/23/21 1146)  azithromycin (ZITHROMAX) 500 mg in sodium chloride 0.9 % 250 mL IVPB (500 mg Intravenous New Bag/Given 09/23/21 1147)  diltiazem (CARDIZEM) 1 mg/mL load via infusion 10 mg (10 mg Intravenous Not Given 09/23/21 1155)    And  diltiazem (CARDIZEM) 125 mg in dextrose 5% 125 mL (1 mg/mL) infusion (has no administration in time range)  sodium chloride 0.9 % bolus 2,000 mL (0 mLs Intravenous Stopped 09/23/21 1136)    ED Course/ Medical Decision Making/ A&P Clinical Course as of 09/23/21 1215  Tue Sep 23, 2021  1128 Heart rate decreasing into the 120s at the bedside.  Lactic acid level is elevated at 4.1.  We will start empiric antibiotics [JK]  1128 DG Chest Port 1 View Chest x-ray images and radiology report reviewed.  Possible pneumonia versus atelectasis [JK]  1148 Urinalysis, Routine w reflex microscopic(!) Urinalysis shows positive nitrite trace leukocyte esterase, rare bacteria [JK]  1153 Still intermittently tachycardic.  Will start a Cardizem infusion [JK]  1214 Discussed with Dr Roosevelt Locks [JK]    Clinical Course User Index [JK] Dorie Rank, MD       CHA2DS2-VASc Score: 1                    Medical Decision Making Patient presented with syncope.  Notably tachycardic and hypotensive here in the ED.   Appears to be in new onset atrial fibrillation.  Differential does includes but not limited to cardiac dysrhythmia, sepsis, seizures anemia, dehydration  Problems Addressed: Atrial fibrillation, rapid ([place]): acute illness or injury that poses a threat to life or bodily functions Sepsis, due to unspecified organism, unspecified whether acute organ dysfunction present [facility]): acute illness or injury that poses a threat to life or bodily functions Syncope and collapse: acute illness or injury that poses a threat to life or bodily functions  Amount and/or Complexity of Data Reviewed Labs: ordered. Decision-making details documented in ED Course. Radiology: ordered. Decision-making details documented in ED Course. ECG/medicine tests: ordered.  Risk Prescription drug management.   Patient presented to the ED after a syncopal episode versus seizure episode.  No prior history of seizures.  On arrival patient noted to be tachycardic with decreased temperature.  Patient appears to be in rapid A-fib.  Heart rate fluctuating.  Blood pressures have been soft.  Concerning about the possibility of evolving sepsis as well although lactic acidosis could be related to the seizure versus syncopal episode.  X-ray does suggest the possibility of pneumonia.  Urine analysis also does suggest the possibility of UTI.  Patient has been started on IV fluids.  We will start Cardizem to decrease his rate.  No indication right now for emergent cardioversion.  Overall suspect his rapid A-fib and possible infection the most likely etiology of his symptoms rather than seizure.  We will consult the medical service for admission and further treatment        Final Clinical Impression(s) / ED Diagnoses Final diagnoses:  Syncope and collapse  Atrial fibrillation, rapid ([place])  Sepsis, due to unspecified organism, unspecified whether acute organ dysfunction present [facility])     Dorie Rank, MD 09/23/21 1216

## 2021-09-23 NOTE — ED Notes (Signed)
Legal guardian reports she is contacting pts PCP, Dr. Dillard Essex [phone]

## 2021-09-24 ENCOUNTER — Inpatient Hospital Stay (HOSPITAL_COMMUNITY): Payer: Medicare HMO

## 2021-09-24 ENCOUNTER — Encounter (HOSPITAL_COMMUNITY): Payer: Self-pay | Admitting: Internal Medicine

## 2021-09-24 DIAGNOSIS — F0390 Unspecified dementia without behavioral disturbance: Secondary | ICD-10-CM

## 2021-09-24 DIAGNOSIS — I4891 Unspecified atrial fibrillation: Secondary | ICD-10-CM | POA: Diagnosis not present

## 2021-09-24 DIAGNOSIS — R55 Syncope and collapse: Secondary | ICD-10-CM | POA: Diagnosis not present

## 2021-09-24 DIAGNOSIS — A419 Sepsis, unspecified organism: Secondary | ICD-10-CM | POA: Diagnosis not present

## 2021-09-24 LAB — BASIC METABOLIC PANEL
Anion gap: 7 (ref 5–15)
BUN: 8 mg/dL (ref 8–23)
CO2: 22 mmol/L (ref 22–32)
Calcium: 8.5 mg/dL — ABNORMAL LOW (ref 8.9–10.3)
Chloride: 112 mmol/L — ABNORMAL HIGH (ref 98–111)
Creatinine, Ser: 0.94 mg/dL (ref 0.61–1.24)
GFR, Estimated: >60 mL/min (ref >60)
Glucose, Bld: 109 mg/dL — ABNORMAL HIGH (ref 70–99)
Potassium: 3.5 mmol/L (ref 3.5–5.1)
Sodium: 141 mmol/L (ref 135–145)

## 2021-09-24 LAB — ECHOCARDIOGRAM COMPLETE
AR max vel: 2.31 cm2
AV Peak grad: 4.2 mmHg
Ao pk vel: 1.02 m/s
Area-P 1/2: 5.09 cm2
Calc EF: 58.5 %
Height: 72 in
S' Lateral: 2.7 cm
Single Plane A2C EF: 56.4 %
Single Plane A4C EF: 58.4 %
Weight: 3054.69 oz

## 2021-09-24 LAB — URINE CULTURE

## 2021-09-24 LAB — PROCALCITONIN: Procalcitonin: <0.1 ng/mL

## 2021-09-24 LAB — HIV ANTIBODY (ROUTINE TESTING W REFLEX): HIV Screen 4th Generation wRfx: NONREACTIVE

## 2021-09-24 MED ORDER — DILTIAZEM HCL ER COATED BEADS 120 MG PO CP24
120.0000 mg | ORAL_CAPSULE | Freq: Every day | ORAL | Status: DC
  Administered 2021-09-24: 120 mg via ORAL
  Filled 2021-09-24: qty 1

## 2021-09-24 NOTE — Hospital Course (Addendum)
Gerald Diaz is a 66 y.o. 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  Patient was a poor historian due to advanced dementia.  In the ED, patient was noted to be in atrial fibrillation with rapid ventricular response and chest x-ray showed pulmonary vascular congestion.   started on Cardizem drip and was admitted hospital for further evaluation and treatment. Converted to sinus 5/26.  Cardizem decreased for low BP.  Assessment and plan.  Principal Problem:   A-fib [facility]) Active Problems:   Dementia ([facility])   New onset a-fib ([facility])    Atrial fibrillation with rapid ventricular response. Initial blood pressures were borderline low and was on Cardizem drip.  Was given digoxin 0.25 mg every 6 hours x 4. CHADS2=0, will continue aspirin.  2 D echocardiogram done -  TSH of 1.9.   - changed to long-acting Cardizem and adjust dose for low BPs    Syncope Likely secondary to hypotension and orthostatic hypotension from A-fib with RVR.  Continue treatment for A-fib.  Initially received a total of 3.5 L IV bolus, new onset seizure less likely.  On seizure precautions.   -PT Eval- home health  SIRS Likely secondary to new onset A-fib.  Procalcitonin less than 0.1 x2. Influenza was negative.  Repeat lactate at 1.7. On Rocephin and Zithromax.  Unlikely to be infection we will discontinue antibiotic at this time.  UA with nitrate positive for no white cells.  Urine culture with multiple species.  Elevated glucose -No history of diabetes, hemoglobin A1c of 5.8 at this time.   Delirium/confusion CT head scan was negative for acute findings. Does not appear to be in delirium.   Dementia Continue supportive care. He is in memory care facility at [facility]. Is incontinent at baseline.  He can sometimes understand things but sometimes he  does not.

## 2021-09-24 NOTE — Progress Notes (Signed)
   09/24/21 1728  Assess: MEWS Score  Temp 98.6 F (37 C)  BP 131/76  Pulse Rate 67  ECG Heart Rate (!) 110  SpO2 96 %  O2 Device Room Air  Assess: MEWS Score  MEWS Temp 0  MEWS Systolic 0  MEWS Pulse 1  MEWS RR 0  MEWS LOC 1  MEWS Score 2  MEWS Score Color Yellow  Assess: if the MEWS score is Yellow or Red  Were vital signs taken at a resting state? No  Focused Assessment Change from prior assessment (see assessment flowsheet)  Early Detection of Sepsis Score *See Row Information* Low  MEWS guidelines implemented *See Row Information* Yes  Treat  MEWS Interventions Administered scheduled meds/treatments  Pain Scale 0-10  Pain Score 0  Take Vital Signs  Increase Vital Sign Frequency  Yellow: Q 2hr X 2 then Q 4hr X 2, if remains yellow, continue Q 4hrs  Escalate  MEWS: Escalate Yellow: discuss with charge nurse/RN and consider discussing with provider and RRT  Notify: Charge Nurse/RN  Name of Charge Nurse/RN Notified Adron Bene  Notify: Provider  Provider Name/Title Joycelyn Das  Date Provider Notified 09/24/21  Time Provider Notified 1733  Method of Notification Page  Notification Reason Change in status  Provider response No new orders  Date of Provider Response 09/24/21  Time of Provider Response 1735  Notify: Rapid Response  Name of Rapid Response RN Notified n/a  Document  Patient Outcome Not stable and remains on department  Progress note created (see row info) Yes   Acute A-Fib

## 2021-09-24 NOTE — Progress Notes (Signed)
Pt is confused follows simple commands, speaks 1-2 words at a time.

## 2021-09-24 NOTE — [place] FL2 (Signed)
[place] MEDICAID FL2 LEVEL OF CARE SCREENING TOOL     IDENTIFICATION  Patient Name: Gerald Diaz Birthdate: 1955-08-07 Sex: male Admission Date (Current Location): 09/23/2021  [facility] and [place] Number:  Producer, television/film/video and Address:  The [place], [address], [place], [place] [address]      Provider Number: [id number]  Attending Physician Name and Address:  Joycelyn Das, MD  Relative Name and Phone Number:  Floydene Flock (Legal Guardian)   [phone]    Current Level of Care: Hospital Recommended Level of Care: Assisted Living Facility Prior Approval Number:    Date Approved/Denied:   PASRR Number:    Discharge Plan: Other (Comment) (ALF)    Current Diagnoses: Patient Active Problem List   Diagnosis Date Noted   New onset a-fib (HCC) 09/23/2021   A-fib (HCC) 09/23/2021   AKI (acute kidney injury) (HCC) 02/06/2020   S/p nephrectomy 02/06/2020   Dementia (HCC) 01/05/2020   Ureteral stone with hydronephrosis    ARF (acute renal failure) (HCC) 01/04/2020    Orientation RESPIRATION BLADDER Height & Weight      (Disoriented x4)  Normal Incontinent, External catheter Weight: 190 lb 14.7 oz (86.6 kg) Height:  6' (182.9 cm)  BEHAVIORAL SYMPTOMS/MOOD NEUROLOGICAL BOWEL NUTRITION STATUS        Diet (See DC Summary)  AMBULATORY STATUS COMMUNICATION OF NEEDS Skin     Does not communicate (Is only saying 1-2 words)                         Personal Care Assistance Level of Assistance  Bathing, Feeding, Dressing           Functional Limitations Info             SPECIAL CARE FACTORS FREQUENCY                       Contractures Contractures Info: Not present    Additional Factors Info  Code Status, Allergies Code Status Info: Full Allergies Info: NKA           Current Medications (09/24/2021):  This is the current hospital active medication list Current Facility-Administered Medications  Medication  Dose Route Frequency Provider Last Rate Last Admin   0.9 %  sodium chloride infusion  250 mL Intravenous PRN Mikey College T, MD       acetaminophen (TYLENOL) tablet 500 mg  500 mg Oral Q4H PRN Emeline General, MD       acetaminophen (TYLENOL) tablet 650 mg  650 mg Oral Q4H PRN Emeline General, MD       aspirin EC tablet 81 mg  81 mg Oral Daily Mikey College T, MD   81 mg at 09/24/21 1058   azithromycin (ZITHROMAX) 500 mg in sodium chloride 0.9 % 250 mL IVPB  500 mg Intravenous Q24H Linwood Dibbles, MD 250 mL/hr at 09/24/21 1150 500 mg at 09/24/21 1150   cefTRIAXone (ROCEPHIN) 2 g in sodium chloride 0.9 % 100 mL IVPB  2 g Intravenous Q24H Linwood Dibbles, MD 200 mL/hr at 09/24/21 1130 2 g at 09/24/21 1130   diltiazem (CARDIZEM) 1 mg/mL load via infusion 10 mg  10 mg Intravenous Once Linwood Dibbles, MD       And   diltiazem (CARDIZEM) 125 mg in dextrose 5% 125 mL (1 mg/mL) infusion  5-15 mg/hr Intravenous Continuous Linwood Dibbles, MD 5 mL/hr at 09/24/21 3664 5 mg/hr at 09/24/21  5956   enoxaparin (LOVENOX) injection 40 mg  40 mg Subcutaneous Q24H Mikey College T, MD   40 mg at 09/23/21 1305   guaiFENesin-dextromethorphan (ROBITUSSIN DM) 100-10 MG/5ML syrup 10 mL  10 mL Oral Q4H PRN Mikey College T, MD       haloperidol lactate (HALDOL) injection 5 mg  5 mg Intravenous Q6H PRN Mikey College T, MD       loratadine (CLARITIN) tablet 10 mg  10 mg Oral Daily Mikey College T, MD   10 mg at 09/24/21 1057   ondansetron (ZOFRAN) injection 4 mg  4 mg Intravenous Q6H PRN Mikey College T, MD       risperiDONE (RISPERDAL) tablet 0.5 mg  0.5 mg Oral BID Mikey College T, MD   0.5 mg at 09/24/21 1057   sertraline (ZOLOFT) tablet 25 mg  25 mg Oral Daily Mikey College T, MD   25 mg at 09/24/21 1056   sodium chloride flush (NS) 0.9 % injection 3 mL  3 mL Intravenous Q12H Mikey College T, MD   3 mL at 09/24/21 1109   sodium chloride flush (NS) 0.9 % injection 3 mL  3 mL Intravenous PRN Mikey College T, MD       traZODone (DESYREL) tablet 50 mg  50 mg Oral QHS  Emeline General, MD   50 mg at 09/23/21 2051     Discharge Medications: Please see discharge summary for a list of discharge medications.  Relevant Imaging Results:  Relevant Lab Results:   Additional Information SSN: [id number]  Ivette Loyal, [place]

## 2021-09-24 NOTE — Plan of Care (Signed)

## 2021-09-24 NOTE — Progress Notes (Addendum)
PROGRESS NOTE    Gerald Diaz  [id number] DOB: 1955/11/06 DOA: 09/23/2021 PCP: [name], MD    Brief Narrative:  Gerald Diaz is a 66 y.o. 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  Patient was a poor historian due to advanced dementia.  In the ED, patient was noted to be in atrial fibrillation with rapid ventricular response and chest x-ray showed pulmonary vascular congestion.  UA was positive for nitrates but white cells were less than 5.  Patient was given 3 and half liters of IV fluid bolus, Rocephin and Zithromax for possible pneumonia and/or UTI.  She was also started on Cardizem drip and was admitted hospital for further evaluation and treatment.  Assessment and plan.  Principal Problem:   A-fib [facility]) Active Problems:   Dementia ([place])   New onset a-fib (HCC)   Atrial fibrillation with rapid ventricular response. Initial blood pressures were borderline low and was on Cardizem drip.  Was given digoxin 0.25 mg every 6 hours x 4. CHADS2=0, will continue aspirin.  2 D echocardiogram pending.  TSH of 1.9.  Rate is much better controlled at this time blood pressure of 108/65.  We will discontinue Cardizem drip and change to long-acting Cardizem at this time.  would consider long acting Cardizem on discharge with aspirin   Syncope Likely secondary to hypotension and orthostatic hypotension from A-fib with RVR.  Continue treatment for A-fib.  Initially received a total of 3.5 L IV bolus, new onset seizure less likely.  On seizure precautions.  We will get PT evaluation for ambulation.  SIRS Likely secondary to new onset A-fib.  Procalcitonin less than 0.1 x2. Influenza was negative.  Repeat lactate at 1.7. On Rocephin and Zithromax.  Unlikely to be infection we will discontinue antibiotic at this time.  UA with  nitrate positive for no white cells.  Urine culture with multiple species.  Elevated glucose -No history of diabetes, hemoglobin A1c of 5.8 at this time.   Delirium/confusion CT head scan was negative for acute findings. Does not appear to be in delirium.   Dementia Continue supportive care. He is in memory care facility at [facility]. Is incontinent at baseline.  He can sometimes understand things but sometimes he does not.  Spoke with the patient's legal guardian.      DVT prophylaxis: enoxaparin (LOVENOX) injection 40 mg Start: 09/23/21 1300   Code Status:     Code Status: Full Code  Disposition: ALF, memory care unit on 09/25/2021 if remains controlled and hemodynamically stable.  TOC involved.  Status is: Inpatient  Remains inpatient appropriate because: Atrial fibrillation with RVR, patient from memory care unit.   Family Communication:  Spoke with the patient's legal guardian Ms Luci Bank and updated her about the clinical condition of the patient.  Consultants:  None  Procedures:  None  Antimicrobials:  Will discontinue  Anti-infectives (From admission, onward)    Start     Dose/Rate Route Frequency Ordered Stop   09/23/21 1145  cefTRIAXone (ROCEPHIN) 2 g in sodium chloride 0.9 % 100 mL IVPB  Status:  Discontinued        2 g 200 mL/hr over 30 Minutes Intravenous Every 24 hours 09/23/21 1131 09/24/21 1449   09/23/21 1145  azithromycin (ZITHROMAX) 500 mg in sodium chloride 0.9 % 250 mL IVPB  Status:  Discontinued  500 mg 250 mL/hr over 60 Minutes Intravenous Every 24 hours 09/23/21 1131 09/24/21 1449        Subjective: Today, patient was seen and examined at bedside.  Patient has a history of dementia.  Poor historian.  Denies chest pain.  Denies shortness of breath.  Objective: Vitals:   09/24/21 1200 09/24/21 1400 09/24/21 1500 09/24/21 1600  BP: 108/65 113/72 120/78 115/85  Pulse: 80 62 72 98  Resp: 16  16   Temp: 98.5 F (36.9 C)  97.8  F (36.6 C)   TempSrc: Oral     SpO2: 98% 97% 98% 96%  Weight:      Height:        Intake/Output Summary (Last 24 hours) at 09/24/2021 1716 Last data filed at 09/24/2021 1400 Gross per 24 hour  Intake 758.59 ml  Output 1000 ml  Net -241.41 ml   Filed Weights   09/23/21 0949 09/23/21 1607 09/24/21 0544  Weight: 70 kg 85.2 kg 86.6 kg    Physical Examination: Body mass index is 25.89 kg/m.  General:  Average built, not in obvious distress HENT:   No scleral pallor or icterus noted. Oral mucosa is moist.  Chest:  Diminished breath sounds bilaterally.  CVS: S1 &S2 heard.  Irregular rhythm but controlled Abdomen: Soft, nontender, nondistended.  Bowel sounds are heard.   Extremities: No cyanosis, clubbing or edema.  Peripheral pulses are palpable. Psych: Alert, awake and communicative, underlying dementia. CNS:  .  Moves extremities Skin: Warm and dry.  No rashes noted.  Data Reviewed:   CBC: Recent Labs  Lab 09/23/21 1006  WBC 9.8  NEUTROABS 7.9*  HGB 13.3  HCT 39.5  MCV 100.3*  PLT [id number]    Basic Metabolic Panel: Recent Labs  Lab 09/23/21 1006 09/24/21 0525  NA 140 141  K 3.7 3.5  CL 109 112*  CO2 22 22  GLUCOSE 190* 109*  BUN 15 8  CREATININE 1.22 0.94  CALCIUM 8.8* 8.5*  MG 2.1  --     Liver Function Tests: Recent Labs  Lab 09/23/21 1006  AST 19  ALT 15  ALKPHOS 91  BILITOT 0.9  PROT 6.0*  ALBUMIN 3.4*     Radiology Studies: CT HEAD WO CONTRAST (5MM)  Result Date: 09/23/2021 CLINICAL DATA:  Delirium EXAM: CT HEAD WITHOUT CONTRAST TECHNIQUE: Contiguous axial images were obtained from the base of the skull through the vertex without intravenous contrast. RADIATION DOSE REDUCTION: This exam was performed according to the departmental dose-optimization program which includes automated exposure control, adjustment of the mA and/or kV according to patient size and/or use of iterative reconstruction technique. COMPARISON:  Head CT dated February 06, 2020  FINDINGS: Brain: Mild global atrophy. No evidence of acute infarction, hemorrhage, hydrocephalus, extra-axial collection or mass lesion/mass effect. Vascular: No hyperdense vessel or unexpected calcification. Skull: Normal. Negative for fracture or focal lesion. Sinuses/Orbits: Mucosal thickening of the ethmoid sinuses Other: None. IMPRESSION: No acute intracranial abnormality. Electronically Signed   By: Yetta Glassman M.D.   On: 09/23/2021 13:46   DG CHEST PORT 1 VIEW  Result Date: 09/24/2021 CLINICAL DATA:  Congestive heart failure EXAM: PORTABLE CHEST 1 VIEW COMPARISON:  None Available. FINDINGS: Normal cardiac silhouette. Mild coarsening of the lower lobe peribronchial lungs. No pleural fluid. No focal consolidation. No pneumothorax. IMPRESSION: Peribronchial thickening in lower lobes could represent bronchitis or early infiltrates. Electronically Signed   By: Suzy Bouchard M.D.   On: 09/24/2021 08:06   DG Chest Port 1  View  Result Date: 09/23/2021 CLINICAL DATA:  Questionable sepsis - evaluate for abnormality EXAM: PORTABLE CHEST 1 VIEW COMPARISON:  CT abdomen pelvis dated February 06, 2020; spine radiograph dated February 11, 2009 FINDINGS: There is an enlarged cardiomediastinal silhouette. Loss of the AP window. No pleural effusion or pneumothorax. Streaky reticulonodular opacities of bilateral lung bases. Linear opacity along the RIGHT superior paramediastinal border. Gaseous distension of the stomach. Postsurgical changes of the LEFT clavicle. IMPRESSION: 1. Enlarged cardiomediastinal silhouette with loss of the AP window. This could be secondary to technique. Consider further dedicated evaluation with repeat PA and lateral chest radiograph versus cross-sectional imaging. 2. Streaky bibasilar reticulonodular opacities with a linear opacity of the RIGHT paramediastinal border. This could reflect infection, aspiration or atelectasis. Electronically Signed   By: Valentino Saxon M.D.   On:  09/23/2021 10:26   ECHOCARDIOGRAM COMPLETE  Result Date: 09/24/2021    ECHOCARDIOGRAM REPORT   Patient Name:   Gerald Diaz [facility] Date of Exam: 09/24/2021 Medical Rec #:  [id number]    Height:       72.0 in Accession #:    [id number]   Weight:       190.9 lb Date of Birth:  [birth year]    BSA:          2.089 m Patient Age:    46 years     BP:           109/61 mmHg Patient Gender: M            HR:           83 bpm. Exam Location:  Inpatient Procedure: 2D Echo, Cardiac Doppler and Color Doppler Indications:    Afib  History:        Patient has no prior history of Echocardiogram examinations.  Sonographer:    Jyl Heinz Referring Phys: [id number] Lequita Halt  Sonographer Comments: Image acquisition challenging due to uncooperative patient. IMPRESSIONS  1. Left ventricular ejection fraction, by estimation, is 55 to 60%. The left ventricle has normal function. The left ventricle has no regional wall motion abnormalities. Left ventricular diastolic parameters are indeterminate.  2. Right ventricular systolic function is normal. The right ventricular size is normal. There is normal pulmonary artery systolic pressure. The estimated right ventricular systolic pressure is 123456 mmHg.  3. The mitral valve is normal in structure. Trivial mitral valve regurgitation. No evidence of mitral stenosis.  4. The aortic valve is tricuspid. Aortic valve regurgitation is not visualized. No aortic stenosis is present.  5. The inferior vena cava is normal in size with greater than 50% respiratory variability, suggesting right atrial pressure of 3 mmHg.  6. The patient was in atrial fibrillation. FINDINGS  Left Ventricle: Left ventricular ejection fraction, by estimation, is 55 to 60%. The left ventricle has normal function. The left ventricle has no regional wall motion abnormalities. The left ventricular internal cavity size was normal in size. There is  no left ventricular hypertrophy. Left ventricular diastolic parameters are indeterminate.  Right Ventricle: The right ventricular size is normal. No increase in right ventricular wall thickness. Right ventricular systolic function is normal. There is normal pulmonary artery systolic pressure. The tricuspid regurgitant velocity is 1.70 m/s, and  with an assumed right atrial pressure of 3 mmHg, the estimated right ventricular systolic pressure is 123456 mmHg. Left Atrium: Left atrial size was normal in size. Right Atrium: Right atrial size was normal in size. Pericardium: There is no evidence of pericardial effusion. Mitral Valve: The  mitral valve is normal in structure. Trivial mitral valve regurgitation. No evidence of mitral valve stenosis. Tricuspid Valve: The tricuspid valve is normal in structure. Tricuspid valve regurgitation is trivial. Aortic Valve: The aortic valve is tricuspid. Aortic valve regurgitation is not visualized. No aortic stenosis is present. Aortic valve peak gradient measures 4.2 mmHg. Pulmonic Valve: The pulmonic valve was normal in structure. Pulmonic valve regurgitation is not visualized. Aorta: The aortic root is normal in size and structure. Venous: The inferior vena cava is normal in size with greater than 50% respiratory variability, suggesting right atrial pressure of 3 mmHg. IAS/Shunts: No atrial level shunt detected by color flow Doppler.  LEFT VENTRICLE PLAX 2D LVIDd:         4.40 cm      Diastology LVIDs:         2.70 cm      LV e' medial:    9.64 cm/s LV PW:         1.30 cm      LV E/e' medial:  5.9 LV IVS:        1.00 cm      LV e' lateral:   11.70 cm/s LVOT diam:     2.20 cm      LV E/e' lateral: 4.8 LV SV:         37 LV SV Index:   18 LVOT Area:     3.80 cm  LV Volumes (MOD) LV vol d, MOD A2C: 115.0 ml LV vol d, MOD A4C: 94.0 ml LV vol s, MOD A2C: 50.1 ml LV vol s, MOD A4C: 39.1 ml LV SV MOD A2C:     64.9 ml LV SV MOD A4C:     94.0 ml LV SV MOD BP:      62.7 ml RIGHT VENTRICLE             IVC RV Basal diam:  2.90 cm     IVC diam: 1.80 cm RV Mid diam:    1.90 cm RV S  prime:     10.40 cm/s TAPSE (M-mode): 1.8 cm LEFT ATRIUM           Index        RIGHT ATRIUM           Index LA diam:      3.00 cm 1.44 cm/m   RA Area:     14.00 cm LA Vol (A2C): 33.3 ml 15.94 ml/m  RA Volume:   32.00 ml  15.32 ml/m LA Vol (A4C): 28.5 ml 13.64 ml/m  AORTIC VALVE AV Area (Vmax): 2.31 cm AV Vmax:        102.00 cm/s AV Peak Grad:   4.2 mmHg LVOT Vmax:      62.10 cm/s LVOT Vmean:     47.600 cm/s LVOT VTI:       0.098 m  AORTA Ao Root diam: 3.40 cm Ao Asc diam:  3.30 cm MITRAL VALVE               TRICUSPID VALVE MV Area (PHT): 5.09 cm    TR Peak grad:   11.6 mmHg MV Decel Time: 149 msec    TR Vmax:        170.00 cm/s MV E velocity: 56.60 cm/s MV A velocity: 32.60 cm/s  SHUNTS MV E/A ratio:  1.74        Systemic VTI:  0.10 m  Systemic Diam: 2.20 cm Dalton McleanMD Electronically signed by Franki Monte Signature Date/Time: 09/24/2021/5:09:53 PM    Final       LOS: 1 day    Flora Lipps, MD [facility] Available via Epic secure chat 7am-7pm After these hours, please refer to coverage provider listed on [email or web address] 09/24/2021, 5:16 PM

## 2021-09-24 NOTE — TOC Initial Note (Signed)
Transition of Care [facility]) - Initial/Assessment Note    Patient Details  Name: Gerald Diaz MRN: [id number] Date of Birth: 01-Oct-1955  Transition [facility]) CM/SW Contact:    Lynett Grimes Phone Number: 09/24/2021, 1:11 PM  Clinical Narrative:                 Pt is from [facility] ALF and will return at DC. Pt has a legal guardian [facility] [employer], [phone] ). Pt does not have a PT evaluation yet, CSW will continue to follow for for DC planning.  Expected Discharge Plan: Assisted Living Barriers to Discharge: Continued Medical Work up   Patient Goals and CMS Choice Patient states their goals for this hospitalization and ongoing recovery are:: Return to ALF CMS Medicare.gov Compare Post Acute Care list provided to:: Legal Guardian Choice offered to / list presented to : NA  Expected Discharge Plan and Services Expected Discharge Plan: Assisted Living In-house Referral: Clinical Social Work   Post Acute Care Choice: NA (ALF) Living arrangements for the past 2 months: Assisted Living Facility                                      Prior Living Arrangements/Services Living arrangements for the past 2 months: Assisted Living Facility Lives with:: Facility Resident Patient language and need for interpreter reviewed:: Yes Do you feel safe going back to the place where you live?: Yes      Need for Family Participation in Patient Care: Yes (Comment) Care giver support system in place?: Yes (comment)   Criminal Activity/Legal Involvement Pertinent to Current Situation/Hospitalization: No - Comment as needed  Activities of Daily Living      Permission Sought/Granted Permission sought to share information with : Facility Medical sales representative, Guardian Permission granted to share information with : Yes, Verbal Permission Granted  Share Information with NAME: Floydene Flock  Permission granted to share info w AGENCY: [facility]  Permission granted to  share info w Relationship: Legal Guardian  Permission granted to share info w Contact Information: [phone]  Emotional Assessment   Attitude/Demeanor/Rapport: Unable to Assess Affect (typically observed): Unable to Assess Orientation: : Fluctuating Orientation (Suspected and/or reported Sundowners) (Dementia) Alcohol / Substance Use: Not Applicable Psych Involvement: No (comment)  Admission diagnosis:  Syncope and collapse [R55] A-fib (HCC) [I48.91] Atrial fibrillation, rapid (HCC) [I48.91] Sepsis, due to unspecified organism, unspecified whether acute organ dysfunction present [facility]) [A41.9] Patient Active Problem List   Diagnosis Date Noted   New onset a-fib (HCC) 09/23/2021   A-fib (HCC) 09/23/2021   AKI (acute kidney injury) (HCC) 02/06/2020   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

## 2021-09-24 NOTE — Progress Notes (Signed)
Heart Failure Navigator Progress Note  Assessed for Heart & Vascular TOC clinic readiness.  Patient does not meet criteria due to Advanced Dementia.     Rhae Hammock, BSN, Scientist, clinical (histocompatibility and immunogenetics) Only

## 2021-09-25 DIAGNOSIS — A419 Sepsis, unspecified organism: Secondary | ICD-10-CM | POA: Diagnosis not present

## 2021-09-25 DIAGNOSIS — F0390 Unspecified dementia without behavioral disturbance: Secondary | ICD-10-CM | POA: Diagnosis not present

## 2021-09-25 DIAGNOSIS — I480 Paroxysmal atrial fibrillation: Secondary | ICD-10-CM | POA: Diagnosis not present

## 2021-09-25 LAB — CBC
HCT: 42 % (ref 39.0–52.0)
Hemoglobin: 14.5 g/dL (ref 13.0–17.0)
MCH: 33.3 pg (ref 26.0–34.0)
MCHC: 34.5 g/dL (ref 30.0–36.0)
MCV: 96.3 fL (ref 80.0–100.0)
Platelets: 183 10*3/uL (ref 150–400)
RBC: 4.36 MIL/uL (ref 4.22–5.81)
RDW: 12.3 % (ref 11.5–15.5)
WBC: 8 10*3/uL (ref 4.0–10.5)
nRBC: 0 % (ref 0.0–0.2)

## 2021-09-25 LAB — MAGNESIUM: Magnesium: 2 mg/dL (ref 1.7–2.4)

## 2021-09-25 LAB — BASIC METABOLIC PANEL
Anion gap: 7 (ref 5–15)
BUN: 8 mg/dL (ref 8–23)
CO2: 25 mmol/L (ref 22–32)
Calcium: 8.8 mg/dL — ABNORMAL LOW (ref 8.9–10.3)
Chloride: 107 mmol/L (ref 98–111)
Creatinine, Ser: 1.04 mg/dL (ref 0.61–1.24)
GFR, Estimated: >60 mL/min (ref >60)
Glucose, Bld: 103 mg/dL — ABNORMAL HIGH (ref 70–99)
Potassium: 3.5 mmol/L (ref 3.5–5.1)
Sodium: 139 mmol/L (ref 135–145)

## 2021-09-25 MED ORDER — DILTIAZEM HCL ER COATED BEADS 180 MG PO CP24: 180.0000 mg | ORAL_CAPSULE | Freq: Every day | ORAL | Status: DC

## 2021-09-25 MED ORDER — POTASSIUM CHLORIDE CRYS ER 20 MEQ PO TBCR
40.0000 meq | EXTENDED_RELEASE_TABLET | Freq: Once | ORAL | Status: AC
  Administered 2021-09-25: 40 meq via ORAL
  Filled 2021-09-25: qty 2

## 2021-09-25 MED ORDER — DILTIAZEM HCL ER COATED BEADS 240 MG PO CP24
240.0000 mg | ORAL_CAPSULE | Freq: Every day | ORAL | Status: DC
  Administered 2021-09-25: 240 mg via ORAL
  Filled 2021-09-25: qty 1

## 2021-09-25 NOTE — Progress Notes (Signed)
HR went to 150s very briefly while patient was resting, will continue to monitor.

## 2021-09-25 NOTE — Progress Notes (Signed)
Patient had an 8 beat run of vtach, provider notified.

## 2021-09-25 NOTE — Progress Notes (Signed)
PROGRESS NOTE    Gerald Diaz  [id number] DOB: [birth year] DOA: 09/23/2021 PCP: [name], MD    Brief Narrative:  Gerald Diaz is a 66 y.o. 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  Patient was a poor historian due to advanced dementia.  In the ED, patient was noted to be in atrial fibrillation with rapid ventricular response and chest x-ray showed pulmonary vascular congestion.   started on Cardizem drip and was admitted hospital for further evaluation and treatment.  Assessment and plan.  Principal Problem:   A-fib [facility]) Active Problems:   Dementia ([place])   New onset a-fib (H[facility])    Atrial fibrillation with rapid ventricular response. Initial blood pressures were borderline low and was on Cardizem drip.  Was given digoxin 0.25 mg every 6 hours x 4. CHADS2=0, will continue aspirin.  2 D echocardiogram done -  TSH of 1.9.   - changed to long-acting Cardizem and adjust dose.  -episode of rapid HR with exertion this AM   Syncope Likely secondary to hypotension and orthostatic hypotension from A-fib with RVR.  Continue treatment for A-fib.  Initially received a total of 3.5 L IV bolus, new onset seizure less likely.  On seizure precautions.   -PT Eval  SIRS Likely secondary to new onset A-fib.  Procalcitonin less than 0.1 x2. Influenza was negative.  Repeat lactate at 1.7. On Rocephin and Zithromax.  Unlikely to be infection we will discontinue antibiotic at this time.  UA with nitrate positive for no white cells.  Urine culture with multiple species.  Elevated glucose -No history of diabetes, hemoglobin A1c of 5.8 at this time.   Delirium/confusion CT head scan was negative for acute findings. Does not appear to be in delirium.   Dementia Continue supportive care. He is in memory care facility at  [facility]. Is incontinent at baseline.  He can sometimes understand things but sometimes he does not.       DVT prophylaxis: enoxaparin (LOVENOX) injection 40 mg Start: 09/23/21 1300   Code Status:     Code Status: Full Code  Disposition: ALF, memory care unit on 09/26/2021 if remains controlled and hemodynamically stable.  TOC involved.  Status is: Inpatient  Remains inpatient appropriate because: Atrial fibrillation with RVR, patient from memory care unit.   Consultants:  None   Subjective: Had rapid a fib episode this AM  Objective: Vitals:   09/25/21 0000 09/25/21 0015 09/25/21 0400 09/25/21 0404  BP: (!) 115/94  133/90   Pulse: 77  (!) 126 79  Resp: 18  18   Temp: 97.8 F (36.6 C)  (!) 97.5 F (36.4 C)   TempSrc: Oral  Oral   SpO2: 95%  96%   Weight:  84.3 kg    Height:        Intake/Output Summary (Last 24 hours) at 09/25/2021 1142 Last data filed at 09/25/2021 0900 Gross per 24 hour  Intake 662.63 ml  Output 800 ml  Net -137.37 ml   Filed Weights   09/23/21 1607 09/24/21 0544 09/25/21 0015  Weight: 85.2 kg 86.6 kg 84.3 kg    Physical Examination:  General: Appearance:     Overweight male in no acute distress     Lungs:     Clear to auscultation bilaterally, respirations unlabored  Heart:    Normal heart rate.   MS:  All extremities are intact.   Neurologic:   Awake, alert     Data Reviewed:   CBC: Recent Labs  Lab 09/23/21 1006 09/25/21 0423  WBC 9.8 8.0  NEUTROABS 7.9*  --   HGB 13.3 14.5  HCT 39.5 42.0  MCV 100.3* 96.3  PLT 159 [id number]    Basic Metabolic Panel: Recent Labs  Lab 09/23/21 1006 09/24/21 0525 09/25/21 0423  NA 140 141 139  K 3.7 3.5 3.5  CL 109 112* 107  CO2 22 22 25   GLUCOSE 190* 109* 103*  BUN 15 8 8   CREATININE 1.22 0.94 1.04  CALCIUM 8.8* 8.5* 8.8*  MG 2.1  --  2.0    Liver Function Tests: Recent Labs  Lab 09/23/21 1006  AST 19  ALT 15  ALKPHOS 91  BILITOT 0.9  PROT 6.0*  ALBUMIN 3.4*      Radiology Studies: CT HEAD WO CONTRAST (5MM)  Result Date: 09/23/2021 CLINICAL DATA:  Delirium EXAM: CT HEAD WITHOUT CONTRAST TECHNIQUE: Contiguous axial images were obtained from the base of the skull through the vertex without intravenous contrast. RADIATION DOSE REDUCTION: This exam was performed according to the departmental dose-optimization program which includes automated exposure control, adjustment of the mA and/or kV according to patient size and/or use of iterative reconstruction technique. COMPARISON:  Head CT dated February 06, 2020 FINDINGS: Brain: Mild global atrophy. No evidence of acute infarction, hemorrhage, hydrocephalus, extra-axial collection or mass lesion/mass effect. Vascular: No hyperdense vessel or unexpected calcification. Skull: Normal. Negative for fracture or focal lesion. Sinuses/Orbits: Mucosal thickening of the ethmoid sinuses Other: None. IMPRESSION: No acute intracranial abnormality. Electronically Signed   By: Yetta Glassman M.D.   On: 09/23/2021 13:46   DG CHEST PORT 1 VIEW  Result Date: 09/24/2021 CLINICAL DATA:  Congestive heart failure EXAM: PORTABLE CHEST 1 VIEW COMPARISON:  None Available. FINDINGS: Normal cardiac silhouette. Mild coarsening of the lower lobe peribronchial lungs. No pleural fluid. No focal consolidation. No pneumothorax. IMPRESSION: Peribronchial thickening in lower lobes could represent bronchitis or early infiltrates. Electronically Signed   By: Suzy Bouchard M.D.   On: 09/24/2021 08:06   ECHOCARDIOGRAM COMPLETE  Result Date: 09/24/2021    ECHOCARDIOGRAM REPORT   Patient Name:   Gerald Diaz [facility] Date of Exam: 09/24/2021 Medical Rec #:  [id number]    Height:       72.0 in Accession #:    [id number]   Weight:       190.9 lb Date of Birth:  09/13/1955    BSA:          2.089 m Patient Age:    60 years     BP:           109/61 mmHg Patient Gender: M            HR:           83 bpm. Exam Location:  Inpatient Procedure: 2D Echo, Cardiac Doppler  and Color Doppler Indications:    Afib  History:        Patient has no prior history of Echocardiogram examinations.  Sonographer:    Jyl Heinz Referring Phys: [id number] Lequita Halt  Sonographer Comments: Image acquisition challenging due to uncooperative patient. IMPRESSIONS  1. Left ventricular ejection fraction, by estimation, is 55 to 60%. The left ventricle has normal function. The left ventricle has no regional wall motion abnormalities. Left ventricular diastolic parameters are indeterminate.  2. Right ventricular systolic function is normal. The right ventricular size  is normal. There is normal pulmonary artery systolic pressure. The estimated right ventricular systolic pressure is 123456 mmHg.  3. The mitral valve is normal in structure. Trivial mitral valve regurgitation. No evidence of mitral stenosis.  4. The aortic valve is tricuspid. Aortic valve regurgitation is not visualized. No aortic stenosis is present.  5. The inferior vena cava is normal in size with greater than 50% respiratory variability, suggesting right atrial pressure of 3 mmHg.  6. The patient was in atrial fibrillation. FINDINGS  Left Ventricle: Left ventricular ejection fraction, by estimation, is 55 to 60%. The left ventricle has normal function. The left ventricle has no regional wall motion abnormalities. The left ventricular internal cavity size was normal in size. There is  no left ventricular hypertrophy. Left ventricular diastolic parameters are indeterminate. Right Ventricle: The right ventricular size is normal. No increase in right ventricular wall thickness. Right ventricular systolic function is normal. There is normal pulmonary artery systolic pressure. The tricuspid regurgitant velocity is 1.70 m/s, and  with an assumed right atrial pressure of 3 mmHg, the estimated right ventricular systolic pressure is 123456 mmHg. Left Atrium: Left atrial size was normal in size. Right Atrium: Right atrial size was normal in size.  Pericardium: There is no evidence of pericardial effusion. Mitral Valve: The mitral valve is normal in structure. Trivial mitral valve regurgitation. No evidence of mitral valve stenosis. Tricuspid Valve: The tricuspid valve is normal in structure. Tricuspid valve regurgitation is trivial. Aortic Valve: The aortic valve is tricuspid. Aortic valve regurgitation is not visualized. No aortic stenosis is present. Aortic valve peak gradient measures 4.2 mmHg. Pulmonic Valve: The pulmonic valve was normal in structure. Pulmonic valve regurgitation is not visualized. Aorta: The aortic root is normal in size and structure. Venous: The inferior vena cava is normal in size with greater than 50% respiratory variability, suggesting right atrial pressure of 3 mmHg. IAS/Shunts: No atrial level shunt detected by color flow Doppler.  LEFT VENTRICLE PLAX 2D LVIDd:         4.40 cm      Diastology LVIDs:         2.70 cm      LV e' medial:    9.64 cm/s LV PW:         1.30 cm      LV E/e' medial:  5.9 LV IVS:        1.00 cm      LV e' lateral:   11.70 cm/s LVOT diam:     2.20 cm      LV E/e' lateral: 4.8 LV SV:         37 LV SV Index:   18 LVOT Area:     3.80 cm  LV Volumes (MOD) LV vol d, MOD A2C: 115.0 ml LV vol d, MOD A4C: 94.0 ml LV vol s, MOD A2C: 50.1 ml LV vol s, MOD A4C: 39.1 ml LV SV MOD A2C:     64.9 ml LV SV MOD A4C:     94.0 ml LV SV MOD BP:      62.7 ml RIGHT VENTRICLE             IVC RV Basal diam:  2.90 cm     IVC diam: 1.80 cm RV Mid diam:    1.90 cm RV S prime:     10.40 cm/s TAPSE (M-mode): 1.8 cm LEFT ATRIUM           Index  RIGHT ATRIUM           Index LA diam:      3.00 cm 1.44 cm/m   RA Area:     14.00 cm LA Vol (A2C): 33.3 ml 15.94 ml/m  RA Volume:   32.00 ml  15.32 ml/m LA Vol (A4C): 28.5 ml 13.64 ml/m  AORTIC VALVE AV Area (Vmax): 2.31 cm AV Vmax:        102.00 cm/s AV Peak Grad:   4.2 mmHg LVOT Vmax:      62.10 cm/s LVOT Vmean:     47.600 cm/s LVOT VTI:       0.098 m  AORTA Ao Root diam: 3.40 cm Ao  Asc diam:  3.30 cm MITRAL VALVE               TRICUSPID VALVE MV Area (PHT): 5.09 cm    TR Peak grad:   11.6 mmHg MV Decel Time: 149 msec    TR Vmax:        170.00 cm/s MV E velocity: 56.60 cm/s MV A velocity: 32.60 cm/s  SHUNTS MV E/A ratio:  1.74        Systemic VTI:  0.10 m                            Systemic Diam: 2.20 cm Dalton McleanMD Electronically signed by Franki Monte Signature Date/Time: 09/24/2021/5:09:53 PM    Final       LOS: 2 days    Geradine Girt, DO [facility] Available via Epic secure chat 7am-7pm After these hours, please refer to coverage provider listed on [email or web address] 09/25/2021, 11:42 AM

## 2021-09-25 NOTE — Plan of Care (Signed)
  Problem: Clinical Measurements: Goal: Respiratory complications will improve Outcome: Progressing   Problem: Safety: Goal: Ability to remain free from injury will improve Outcome: Progressing   

## 2021-09-25 NOTE — Progress Notes (Signed)
Patient was in a.fib earlier, I noticed flutter waves and tried to capture on EKG. EKG abnormal, but states "NSR with 1st degree AVB". Rate WNL. Provider notified.

## 2021-09-25 NOTE — Progress Notes (Signed)
Another brief episode of a.fib rvr, lasting only seconds. VSS.

## 2021-09-25 NOTE — Evaluation (Signed)
Physical Therapy Evaluation Patient Details Name: Gerald Diaz MRN: [id number] DOB: [birth year] Today's Date: 09/25/2021  History of Present Illness  The pt is a 66 yo male presenting from memory care at [facility] on 5/23 after syncopal episode with seizure-like activity. Pt found to be in rapid afib. PMH includes: advanced dementia, R nephrectomy, PSVT.   Clinical Impression  Pt in bed upon arrival of PT, agreeable to evaluation at this time. Given advanced dementia, pt unable to provide hx or PLOF, per chart, pt mobilizing with RW at assisted living facility, receiving assist from staff in memory care unit for ADLs. The pt now presents with limitations in functional mobility, strength, ROM, command following, and seated balance due to above dx, and will continue to benefit from skilled PT to address these deficits. The pt required totalA to complete transfer to sitting EOB, and to maintain static sitting EOB at this time due to strong R lateral lean and mild posterior lean. Pt with persistent soft BP through session that did not change with position. Further OOB mobility deferred at this time due to lack of command following and assist level needed to maintain seated balance at EOB.   VITALS:  - supine in bed- BP: 65/51 (57); - supine in bed - BP: 73/53 (61); - supine post sitting EOB for 5 min - BP: 80/58 (66); - bed in chair position at end of session - BP: 92/66 (76)      Recommendations for follow up therapy are one component of a multi-disciplinary discharge planning process, led by the attending physician.  Recommendations may be updated based on patient status, additional functional criteria and insurance authorization.  Follow Up Recommendations Home health PT (return to assisted living/memory care)    Assistance Recommended at Discharge Frequent or constant Supervision/Assistance  Patient can return home with the following  Two people to help with walking and/or transfers;Two  people to help with bathing/dressing/bathroom;Assistance with feeding;Direct supervision/assist for medications management;Direct supervision/assist for financial management;Help with stairs or ramp for entrance    Equipment Recommendations None recommended by PT  Recommendations for Other Services       Functional Status Assessment Patient has had a recent decline in their functional status and demonstrates the ability to make significant improvements in function in a reasonable and predictable amount of time.     Precautions / Restrictions Precautions Precautions: Fall Precaution Comments: advanced dementia, soft BP Restrictions Weight Bearing Restrictions: No      Mobility  Bed Mobility Overal bed mobility: Needs Assistance Bed Mobility: Supine to Sit, Sit to Supine     Supine to sit: Total assist Sit to supine: Total assist   General bed mobility comments: pt with no initiation of movement to verbal cues or instructions, not assisting PT when movements initiated for him.    Transfers                   General transfer comment: unsafe to assist, soft BP, pt not following commands, needing totalA to maintian balance sitting EOB      Balance Overall balance assessment: Needs assistance Sitting-balance support: Bilateral upper extremity supported, Feet supported Sitting balance-Leahy Scale: Zero Sitting balance - Comments: R lateral lean and posterior at times, falling laterally without support from therapist Postural control: Right lateral lean, Posterior lean  Pertinent Vitals/Pain Pain Assessment Pain Assessment: Faces Faces Pain Scale: No hurt Pain Intervention(s): Monitored during session    Home Living Family/patient expects to be discharged to:: Assisted living (memory care at [facility])                   Additional Comments: pt with advanced dementia, from memory care, unable to contribute  to hx    Prior Function Prior Level of Function : Patient poor historian/Family not available             Mobility Comments: per chart, pt ambulating with RW ADLs Comments: per chart, pt receives assist from staff     Hand Dominance        Extremity/Trunk Assessment   Upper Extremity Assessment Upper Extremity Assessment: Generalized weakness;Difficult to assess due to impaired cognition    Lower Extremity Assessment Lower Extremity Assessment: Generalized weakness;Difficult to assess due to impaired cognition    Cervical / Trunk Assessment Cervical / Trunk Assessment: Kyphotic;Other exceptions Cervical / Trunk Exceptions: R lateral lean and R alteral cervical flexion  Communication   Communication: Expressive difficulties  Cognition Arousal/Alertness: Lethargic Behavior During Therapy: Flat affect Overall Cognitive Status: History of cognitive impairments - at baseline                                 General Comments: pt not oriented to self, time, place, or situation. answering "edward" when asked his name. pt responds to all verbal cues  with looking at the PT, but did not follow any instructions or cues given        General Comments General comments (skin integrity, edema, etc.): BP 65/51 (57) in supine, 73/53(61) in supine, 80/58 (66) after sitting EOB, 92/66 (76) at end of session with [facility] elevated        Assessment/Plan    PT Assessment Patient needs continued PT services  PT Problem List Decreased strength;Decreased activity tolerance;Decreased balance;Decreased mobility;Decreased coordination;Decreased safety awareness       PT Treatment Interventions DME instruction;Gait training;Functional mobility training;Therapeutic activities;Therapeutic exercise;Balance training;Patient/family education    PT Goals (Current goals can be found in the Care Plan section)  Acute Rehab PT Goals Patient Stated Goal: none stated PT Goal Formulation:  Patient unable to participate in goal setting Time For Goal Achievement: 10/09/21 Potential to Achieve Goals: Fair    Frequency Min 2X/week        AM-PAC PT "6 Clicks" Mobility  Outcome Measure Help needed turning from your back to your side while in a flat bed without using bedrails?: Total Help needed moving from lying on your back to sitting on the side of a flat bed without using bedrails?: Total Help needed moving to and from a bed to a chair (including a wheelchair)?: Total Help needed standing up from a chair using your arms (e.g., wheelchair or bedside chair)?: Total Help needed to walk in hospital room?: Total Help needed climbing 3-5 steps with a railing? : Total 6 Click Score: 6    End of Session   Activity Tolerance: Patient limited by fatigue;Patient limited by lethargy Patient left: in bed;with call bell/phone within reach;with bed alarm set;with restraints reapplied (bilateral mitts) Nurse Communication: Mobility status (soft BP) PT Visit Diagnosis: Unsteadiness on feet (R26.81);Other abnormalities of gait and mobility (R26.89);Muscle weakness (generalized) (M62.81)    Time: [id number] PT Time Calculation (min) (ACUTE ONLY): 18 min   Charges:   PT Evaluation $  PT Eval Moderate Complexity: 1 Mod          West Carbo, PT, DPT   Acute Rehabilitation Department Pager #: [phone]  Sandra Cockayne 09/25/2021, 2:38 PM

## 2021-09-25 NOTE — Progress Notes (Signed)
PT Cancellation Note  Patient Details Name: CAMEREN EARNEST MRN: [id number] DOB: [birth year]   Cancelled Treatment:    Reason Eval/Treat Not Completed: Medical issues which prohibited therapy this morning, as pt with HR sustaining in 150s, RN asked PT to hold until HR better under control with medications. Will return to evaluate as time/schedule allows.   Vickki Muff, PT, DPT   Acute Rehabilitation Department Pager #: [phone]   Ronnie Derby 09/25/2021, 8:58 AM

## 2021-09-25 NOTE — Plan of Care (Signed)
  Problem: Education: Goal: Knowledge of General Education information will improve Description: Including pain rating scale, medication(s)/side effects and non-pharmacologic comfort measures Outcome: Not Applicable   Problem: Clinical Measurements: Goal: Ability to maintain clinical measurements within normal limits will improve Outcome: Not Applicable Goal: Diagnostic test results will improve Outcome: Not Applicable   Patient has dementia.

## 2021-09-26 DIAGNOSIS — I480 Paroxysmal atrial fibrillation: Secondary | ICD-10-CM | POA: Diagnosis not present

## 2021-09-26 MED ORDER — DILTIAZEM HCL ER COATED BEADS 180 MG PO CP24
180.0000 mg | ORAL_CAPSULE | Freq: Every day | ORAL | Status: DC
  Administered 2021-09-26 – 2021-10-02 (×7): 180 mg via ORAL
  Filled 2021-09-26 (×7): qty 1

## 2021-09-26 NOTE — TOC Progression Note (Addendum)
Transition of Care [facility]) - Progression Note    Patient Details  Name: Gerald Diaz MRN: [id number] Date of Birth: 01-18-1956  [facility] CM/SW Contact  Reece Agar, [place] Phone Number: 09/26/2021, 4:01 PM  Clinical Narrative:    CSW following, pt may DC tomorrow if medically stable. CSW contacted Melissa at [facility] no answer CSW left a VM.  Admissions at [facility] contacted CSW back and shared that pt would have had to DC today bc no one will be in office on Monday and their pharmacy closes at 4pm on Friday, new meds cannot be filled over the weekend. Pt will need to DC on Tuesday.   Expected Discharge Plan: Assisted Living Barriers to Discharge: Continued Medical Work up  Expected Discharge Plan and Services Expected Discharge Plan: Assisted Living In-house Referral: Clinical Social Work   Post Acute Care Choice: NA (ALF) Living arrangements for the past 2 months: [place]                                       Social Determinants of Health (SDOH) Interventions    Readmission Risk Interventions    02/09/2020   11:58 AM  Readmission Risk Prevention Plan  Post Dischage Appt Not Complete  Appt Comments d/c date tbd  Medication Screening Complete  Transportation Screening Complete

## 2021-09-26 NOTE — Progress Notes (Signed)
PROGRESS NOTE    Gerald Diaz  ZOX:[id number] DOB: 09-Jan-1956 DOA: 09/23/2021 PCP: Herschel SenegalHaber, Michele, MD    Brief Narrative:  Gerald Diaz is a 66 y.o. 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  Patient was a poor historian due to advanced dementia.  In the ED, patient was noted to be in atrial fibrillation with rapid ventricular response and chest x-ray showed pulmonary vascular congestion.   started on Cardizem drip and was admitted hospital for further evaluation and treatment. Converted to sinus 5/26.  Cardizem decreased for low BP.  Assessment and plan.  Principal Problem:   A-fib [facility](HCC) Active Problems:   Dementia (HCC)   New onset a-fib (HCC)    Atrial fibrillation with rapid ventricular response. Initial blood pressures were borderline low and was on Cardizem drip.  Was given digoxin 0.25 mg every 6 hours x 4. CHADS2=0, will continue aspirin.  2 D echocardiogram done -  TSH of 1.9.   - changed to long-acting Cardizem and adjust dose for low BPs    Syncope Likely secondary to hypotension and orthostatic hypotension from A-fib with RVR.  Continue treatment for A-fib.  Initially received a total of 3.5 L IV bolus, new onset seizure less likely.  On seizure precautions.   -PT Eval- home health  SIRS Likely secondary to new onset A-fib.  Procalcitonin less than 0.1 x2. Influenza was negative.  Repeat lactate at 1.7. On Rocephin and Zithromax.  Unlikely to be infection we will discontinue antibiotic at this time.  UA with nitrate positive for no white cells.  Urine culture with multiple species.  Elevated glucose -No history of diabetes, hemoglobin A1c of 5.8 at this time.   Delirium/confusion CT head scan was negative for acute findings. Does not appear to be in delirium.   Dementia Continue supportive care.  He is in memory care facility at [facility]. Is incontinent at baseline.  He can sometimes understand things but sometimes he does not.       DVT prophylaxis: enoxaparin (LOVENOX) injection 40 mg Start: 09/23/21 1300   Code Status:     Code Status: Full Code  Disposition: ALF, memory care unit on 09/26/2021 if remains controlled and hemodynamically stable.  TOC involved.  Status is: Inpatient  Remains inpatient appropriate because: Atrial fibrillation with RVR, patient from memory care unit.   Consultants:  None   Subjective: Had rapid a fib episode this AM  Objective: Vitals:   09/26/21 0000 09/26/21 0352 09/26/21 0400 09/26/21 0800  BP: 106/82 100/71  97/69  Pulse: 87 80  80  Resp:  16    Temp:  98.3 F (36.8 C)  97.9 F (36.6 C)  TempSrc:  Oral  Oral  SpO2:  95%  97%  Weight:   83 kg   Height:        Intake/Output Summary (Last 24 hours) at 09/26/2021 1045 Last data filed at 09/26/2021 [id number] Gross per 24 hour  Intake 840 ml  Output --  Net 840 ml   Filed Weights   09/24/21 0544 09/25/21 0015 09/26/21 0400  Weight: 86.6 kg 84.3 kg 83 kg    Physical Examination:   General: Appearance:    Well developed, well nourished male in no acute distress     Lungs:      respirations unlabored  Heart:    Normal heart rate.  MS:   All extremities are intact.   Neurologic:   Awake, alert, oriented x 3       Data Reviewed:   CBC: Recent Labs  Lab 09/23/21 1006 09/25/21 0423  WBC 9.8 8.0  NEUTROABS 7.9*  --   HGB 13.3 14.5  HCT 39.5 42.0  MCV 100.3* 96.3  PLT 159 183    Basic Metabolic Panel: Recent Labs  Lab 09/23/21 1006 09/24/21 0525 09/25/21 0423  NA 140 141 139  K 3.7 3.5 3.5  CL 109 112* 107  CO2 GLUCOSE 190* 109* 103*  BUN CREATININE 1.22 0.94 1.04  CALCIUM 8.8* 8.5* 8.8*  MG 2.1  --  2.0    Liver Function Tests: Recent Labs  Lab 09/23/21 1006  AST 19  ALT 15  ALKPHOS 91  BILITOT 0.9  PROT 6.0*  ALBUMIN  3.4*     Radiology Studies: ECHOCARDIOGRAM COMPLETE  Result Date: 09/24/2021    ECHOCARDIOGRAM REPORT   Patient Name:   Gerald Diaz [facility] Date of Exam: 09/24/2021 Medical Rec #:  [id number]    Height:       72.0 in Accession #:    [id number]   Weight:       190.9 lb Date of Birth:  Sep 10, 1955    BSA:          2.089 m Patient Age:    66 years     BP:           109/61 mmHg Patient Gender: M            HR:           83 bpm. Exam Location:  Inpatient Procedure: 2D Echo, Cardiac Doppler and Color Doppler Indications:    Afib  History:        Patient has no prior history of Echocardiogram examinations.  Sonographer:    Cleatis Polka Referring Phys: [id number] Emeline General  Sonographer Comments: Image acquisition challenging due to uncooperative patient. IMPRESSIONS  1. Left ventricular ejection fraction, by estimation, is 55 to 60%. The left ventricle has normal function. The left ventricle has no regional wall motion abnormalities. Left ventricular diastolic parameters are indeterminate.  2. Right ventricular systolic function is normal. The right ventricular size is normal. There is normal pulmonary artery systolic pressure. The estimated right ventricular systolic pressure is 14.6 mmHg.  3. The mitral valve is normal in structure. Trivial mitral valve regurgitation. No evidence of mitral stenosis.  4. The aortic valve is tricuspid. Aortic valve regurgitation is not visualized. No aortic stenosis is present.  5. The inferior vena cava is normal in size with greater than 50% respiratory variability, suggesting right atrial pressure of 3 mmHg.  6. The patient was in atrial fibrillation. FINDINGS  Left Ventricle: Left ventricular ejection fraction, by estimation, is 55 to 60%. The left ventricle has normal function. The left ventricle has no regional wall motion abnormalities. The left ventricular internal cavity size was normal in size. There is  no left ventricular hypertrophy. Left ventricular diastolic parameters are  indeterminate. Right Ventricle: The right ventricular size is normal. No increase in right ventricular wall thickness. Right ventricular systolic function is normal. There is normal pulmonary artery systolic pressure. The tricuspid regurgitant velocity is 1.70 m/s, and  with an assumed right atrial pressure of 3 mmHg, the estimated right ventricular systolic pressure is 14.6 mmHg. Left Atrium: Left atrial size was normal in size. Right Atrium: Right atrial  size was normal in size. Pericardium: There is no evidence of pericardial effusion. Mitral Valve: The mitral valve is normal in structure. Trivial mitral valve regurgitation. No evidence of mitral valve stenosis. Tricuspid Valve: The tricuspid valve is normal in structure. Tricuspid valve regurgitation is trivial. Aortic Valve: The aortic valve is tricuspid. Aortic valve regurgitation is not visualized. No aortic stenosis is present. Aortic valve peak gradient measures 4.2 mmHg. Pulmonic Valve: The pulmonic valve was normal in structure. Pulmonic valve regurgitation is not visualized. Aorta: The aortic root is normal in size and structure. Venous: The inferior vena cava is normal in size with greater than 50% respiratory variability, suggesting right atrial pressure of 3 mmHg. IAS/Shunts: No atrial level shunt detected by color flow Doppler.  LEFT VENTRICLE PLAX 2D LVIDd:         4.40 cm      Diastology LVIDs:         2.70 cm      LV e' medial:    9.64 cm/s LV PW:         1.30 cm      LV E/e' medial:  5.9 LV IVS:        1.00 cm      LV e' lateral:   11.70 cm/s LVOT diam:     2.20 cm      LV E/e' lateral: 4.8 LV SV:         37 LV SV Index:   18 LVOT Area:     3.80 cm  LV Volumes (MOD) LV vol d, MOD A2C: 115.0 ml LV vol d, MOD A4C: 94.0 ml LV vol s, MOD A2C: 50.1 ml LV vol s, MOD A4C: 39.1 ml LV SV MOD A2C:     64.9 ml LV SV MOD A4C:     94.0 ml LV SV MOD BP:      62.7 ml RIGHT VENTRICLE             IVC RV Basal diam:  2.90 cm     IVC diam: 1.80 cm RV Mid diam:     1.90 cm RV S prime:     10.40 cm/s TAPSE (M-mode): 1.8 cm LEFT ATRIUM           Index        RIGHT ATRIUM           Index LA diam:      3.00 cm 1.44 cm/m   RA Area:     14.00 cm LA Vol (A2C): 33.3 ml 15.94 ml/m  RA Volume:   32.00 ml  15.32 ml/m LA Vol (A4C): 28.5 ml 13.64 ml/m  AORTIC VALVE AV Area (Vmax): 2.31 cm AV Vmax:        102.00 cm/s AV Peak Grad:   4.2 mmHg LVOT Vmax:      62.10 cm/s LVOT Vmean:     47.600 cm/s LVOT VTI:       0.098 m  AORTA Ao Root diam: 3.40 cm Ao Asc diam:  3.30 cm MITRAL VALVE               TRICUSPID VALVE MV Area (PHT): 5.09 cm    TR Peak grad:   11.6 mmHg MV Decel Time: 149 msec    TR Vmax:        170.00 cm/s MV E velocity: 56.60 cm/s MV A velocity: 32.60 cm/s  SHUNTS MV E/A ratio:  1.74        Systemic VTI:  0.10 m  Systemic Diam: 2.20 cm Dalton McleanMD Electronically signed by Wilfred Lacy Signature Date/Time: 09/24/2021/5:09:53 PM    Final       LOS: 3 days    Joseph Art, DO [facility] Available via Epic secure chat 7am-7pm After these hours, please refer to coverage provider listed on [email or web address] 09/26/2021, 10:45 AM

## 2021-09-27 DIAGNOSIS — R55 Syncope and collapse: Secondary | ICD-10-CM

## 2021-09-27 DIAGNOSIS — R7303 Prediabetes: Secondary | ICD-10-CM

## 2021-09-27 DIAGNOSIS — F0390 Unspecified dementia without behavioral disturbance: Secondary | ICD-10-CM | POA: Diagnosis not present

## 2021-09-27 DIAGNOSIS — R6510 Systemic inflammatory response syndrome (SIRS) of non-infectious origin without acute organ dysfunction: Secondary | ICD-10-CM

## 2021-09-27 DIAGNOSIS — I480 Paroxysmal atrial fibrillation: Secondary | ICD-10-CM | POA: Diagnosis not present

## 2021-09-27 HISTORY — DX: Prediabetes: R73.03

## 2021-09-27 LAB — BASIC METABOLIC PANEL
Anion gap: 6 (ref 5–15)
BUN: 19 mg/dL (ref 8–23)
CO2: 22 mmol/L (ref 22–32)
Calcium: 8.7 mg/dL — ABNORMAL LOW (ref 8.9–10.3)
Chloride: 110 mmol/L (ref 98–111)
Creatinine, Ser: 1.12 mg/dL (ref 0.61–1.24)
GFR, Estimated: >60 mL/min (ref >60)
Glucose, Bld: 94 mg/dL (ref 70–99)
Potassium: 3.7 mmol/L (ref 3.5–5.1)
Sodium: 138 mmol/L (ref 135–145)

## 2021-09-27 NOTE — Assessment & Plan Note (Addendum)
Initial blood pressures were borderline low and was on Cardizem drip.  Was given digoxin 0.25 mg every 6 hours x 4. CHADS2=0, will continue aspirin.  -Echo performed, EF 55 to 60%, indeterminate diastolic parameters -  TSH of 1.9.   -Continue oral Cardizem and aspirin

## 2021-09-27 NOTE — Plan of Care (Signed)
  Problem: Health Behavior/Discharge Planning: Goal: Ability to manage health-related needs will improve Outcome: Progressing   Problem: Clinical Measurements: Goal: Will remain free from infection Outcome: Progressing   

## 2021-09-27 NOTE — Assessment & Plan Note (Addendum)
-   hemoglobin A1c of 5.8  -Continue diet control

## 2021-09-27 NOTE — Progress Notes (Signed)
Progress Note    Gerald Diaz   [id number]  DOB: [birth year]  DOA: 09/23/2021     4 PCP: [name], MD  Initial CC: Syncope  Hospital Course: Gerald Diaz is a 66 y.o. 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   Patient was a poor historian due to advanced dementia.  In the ED, patient was noted to be in atrial fibrillation with rapid ventricular response and chest x-ray showed pulmonary vascular congestion. He was started on Cardizem drip and was admitted hospital for further evaluation and treatment. Converted to sinus 5/26.  Cardizem decreased for low BP.  Vital stabilized and he was continued on Cardizem.  CHA2DS2-VASc score was appropriate for treating with aspirin only for stroke prevention.  Interval History:  Resting in bed in no distress.  Underlying dementia appreciated and unable to answer many questions this morning.  Assessment and Plan: * Paroxysmal atrial fibrillation with RVR (HCC) Initial blood pressures were borderline low and was on Cardizem drip.  Was given digoxin 0.25 mg every 6 hours x 4. CHADS2=0, will continue aspirin.  -Echo performed, EF 55 to 60%, indeterminate diastolic parameters -  TSH of 1.9.   -Continue oral Cardizem and aspirin  Prediabetes - hemoglobin A1c of 5.8  -Continue diet control  Syncope Likely secondary to hypotension and orthostatic hypotension from A-fib with RVR.  Continue treatment for A-fib.   -Fluid resuscitated on admission as well  Dementia ([place]) - Continue supportive care and delirium precautions - Resides in care at [facility] for discharging back on Tuesday  SIRS (systemic inflammatory response syndrome) (HCC)-resolved as of 09/27/2021 - Considered due to underlying A-fib with RVR on admission.  Negative procalcitonin - Negative infectious work-up  overall.  Antibiotics discontinued   Old records reviewed in assessment of this patient  Antimicrobials:   DVT prophylaxis:  enoxaparin (LOVENOX) injection 40 mg Start: 09/23/21 1300   Code Status:   Code Status: Full Code  Disposition Plan: [facility] on Tuesday Status is: Inpatient  Objective: Blood pressure 111/78, pulse (!) 105, temperature 98.7 F (37.1 C), temperature source Oral, resp. rate 18, height 6' (1.829 m), weight 84.5 kg, SpO2 97 %.  Examination:  Physical Exam Constitutional:      General: He is not in acute distress.    Appearance: Normal appearance.     Comments: Cooperative  HENT:     Head: Normocephalic and atraumatic.     Mouth/Throat:     Mouth: Mucous membranes are moist.  Eyes:     Extraocular Movements: Extraocular movements intact.  Cardiovascular:     Rate and Rhythm: Normal rate. Rhythm irregular.     Heart sounds: Normal heart sounds.  Pulmonary:     Effort: Pulmonary effort is normal. No respiratory distress.     Breath sounds: Normal breath sounds. No wheezing.  Abdominal:     General: Bowel sounds are normal. There is no distension.     Palpations: Abdomen is soft.     Tenderness: There is no abdominal tenderness.  Musculoskeletal:        General: Normal range of motion.     Cervical back: Normal range of motion and neck supple.  Skin:    General: Skin is warm and dry.  Neurological:     General: No focal deficit present.     Mental Status: He is alert.  He is disoriented.     Comments: Underlying dementia appreciated.  Follows commands  Psychiatric:        Mood and Affect: Mood normal.        Behavior: Behavior normal.     Consultants:    Procedures:    Data Reviewed: Results for orders placed or performed during the hospital encounter of 09/23/21 (from the past 24 hour(s))  Basic metabolic panel     Status: Abnormal   Collection Time: 09/27/21  4:42 AM  Result Value Ref Range   Sodium 138 135 - 145 mmol/L    Potassium 3.7 3.5 - 5.1 mmol/L   Chloride 110 98 - 111 mmol/L   CO2 22 22 - 32 mmol/L   Glucose, Bld 94 70 - 99 mg/dL   BUN 19 8 - 23 mg/dL   Creatinine, Ser 1.12 0.61 - 1.24 mg/dL   Calcium 8.7 (L) 8.9 - 10.3 mg/dL   GFR, Estimated >60 >60 mL/min   Anion gap 6 5 - 15    I have Reviewed nursing notes, Vitals, and Lab results since pt's last encounter. Pertinent lab results : see above I have ordered test including BMP, CBC, Mg I have reviewed the last note from staff over past 24 hours I have discussed pt's care plan and test results with nursing staff, case manager   LOS: 4 days   Dwyane Dee, MD [facility] 09/27/2021, 11:51 AM

## 2021-09-27 NOTE — Assessment & Plan Note (Addendum)
-   Continue supportive care and delirium precautions - Resides in care at [facility]; facility concerned patient too low functioning to return; dispo still TBD

## 2021-09-27 NOTE — Assessment & Plan Note (Addendum)
-   Considered due to underlying A-fib with RVR on admission.  Negative procalcitonin - Negative infectious work-up overall.  Antibiotics discontinued

## 2021-09-27 NOTE — Assessment & Plan Note (Addendum)
Likely secondary to hypotension and orthostatic hypotension from A-fib with RVR.  Continue treatment for A-fib.   -Fluid resuscitated on admission as well

## 2021-09-27 NOTE — Progress Notes (Signed)
Physical Therapy Treatment Patient Details Name: Gerald Diaz MRN: [id number] DOB: 1955/08/03 Today's Date: 09/27/2021   History of Present Illness The pt is a 66 yo male presenting from memory care at [facility] on 5/23 after syncopal episode with seizure-like activity. Pt found to be in rapid afib. PMH includes: advanced dementia, R nephrectomy, PSVT.    PT Comments    Pt with improving mobility but continues to be limited by cognition. Recommend return to familiar environment and staff at ALF where he will likely do better.    Recommendations for follow up therapy are one component of a multi-disciplinary discharge planning process, led by the attending physician.  Recommendations may be updated based on patient status, additional functional criteria and insurance authorization.  Follow Up Recommendations  Home health PT (return to assisted living/memory care)     Assistance Recommended at Discharge Frequent or constant Supervision/Assistance  Patient can return home with the following Two people to help with walking and/or transfers;Two people to help with bathing/dressing/bathroom;Assistance with feeding;Direct supervision/assist for medications management;Direct supervision/assist for financial management;Help with stairs or ramp for entrance   Equipment Recommendations  None recommended by PT    Recommendations for Other Services       Precautions / Restrictions Precautions Precautions: Fall Precaution Comments: advanced dementia, soft BP Restrictions Weight Bearing Restrictions: No     Mobility  Bed Mobility Overal bed mobility: Needs Assistance Bed Mobility: Supine to Sit, Sit to Supine     Supine to sit: Min assist, HOB elevated Sit to supine: Min assist   General bed mobility comments: Assist to initiate movement into sitting and to return to supine    Transfers Overall transfer level: Needs assistance Equipment used: 1 person hand held assist, Rolling  walker (2 wheels) Transfers: Sit to/from Stand Sit to Stand: Min assist, Mod assist           General transfer comment: Stood x 6 from bed with varying assist of min to mod assist. Pt turning partially to the rt and focused on items on bedside table that was too his rt    Ambulation/Gait               General Gait Details: Unable due to pt unable to attend to task and distracted   Stairs             Wheelchair Mobility    Modified Rankin (Stroke Patients Only)       Balance Overall balance assessment: Needs assistance Sitting-balance support: Feet supported, No upper extremity supported Sitting balance-Leahy Scale: Fair     Standing balance support: Single extremity supported, During functional activity, No upper extremity supported Standing balance-Leahy Scale: Poor Standing balance comment: min to mod assist to stand. Pt leaning and reaching for items on bedside tray to the right and unable to attend to standing                            Cognition Arousal/Alertness: Awake/alert Behavior During Therapy: Flat affect Overall Cognitive Status: History of cognitive impairments - at baseline                                 General Comments: Only intermittent following of commands        Exercises      General Comments        Pertinent Vitals/Pain Pain Assessment Pain  Assessment: Faces Faces Pain Scale: No hurt    Home Living                          Prior Function            PT Goals (current goals can now be found in the care plan section) Acute Rehab PT Goals Patient Stated Goal: none stated Progress towards PT goals: Progressing toward goals    Frequency    Min 2X/week      PT Plan Current plan remains appropriate    Co-evaluation              AM-PAC PT "6 Clicks" Mobility   Outcome Measure  Help needed turning from your back to your side while in a flat bed without using  bedrails?: A Little Help needed moving from lying on your back to sitting on the side of a flat bed without using bedrails?: A Little Help needed moving to and from a bed to a chair (including a wheelchair)?: Total Help needed standing up from a chair using your arms (e.g., wheelchair or bedside chair)?: Total Help needed to walk in hospital room?: Total Help needed climbing 3-5 steps with a railing? : Total 6 Click Score: 10    End of Session Equipment Utilized During Treatment: Gait belt Activity Tolerance: Other (comment) (limited by cognition) Patient left: in bed;with call bell/phone within reach;with bed alarm set Nurse Communication: Mobility status PT Visit Diagnosis: Unsteadiness on feet (R26.81);Other abnormalities of gait and mobility (R26.89);Muscle weakness (generalized) (M62.81)     Time: 5701-7793 PT Time Calculation (min) (ACUTE ONLY): 21 min  Charges:  $Therapeutic Activity: 8-22 mins                     [facility] PT Acute Rehabilitation Services Office [phone]    Angelina Ok [facility] 09/27/2021, 5:48 PM

## 2021-09-28 DIAGNOSIS — I480 Paroxysmal atrial fibrillation: Secondary | ICD-10-CM | POA: Diagnosis not present

## 2021-09-28 LAB — CULTURE, BLOOD (ROUTINE X 2)
Culture: NO GROWTH
Culture: NO GROWTH
Special Requests: ADEQUATE
Special Requests: ADEQUATE

## 2021-09-28 NOTE — Plan of Care (Signed)
  Problem: Health Behavior/Discharge Planning: Goal: Ability to manage health-related needs will improve Outcome: Progressing   Problem: Nutrition: Goal: Adequate nutrition will be maintained Outcome: Progressing   Problem: Elimination: Goal: Will not experience complications related to bowel motility Outcome: Progressing   

## 2021-09-28 NOTE — Progress Notes (Signed)
Progress Note    Gerald Diaz   [id number]  DOB: 1955/12/12  DOA: 09/23/2021     5 PCP: [name], MD  Initial CC: Syncope  Hospital Course: [facility] Gerald Diaz is a 66 y.o. 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   Patient was a poor historian due to advanced dementia.  In the ED, patient was noted to be in atrial fibrillation with rapid ventricular response and chest x-ray showed pulmonary vascular congestion. He was started on Cardizem drip and was admitted hospital for further evaluation and treatment. Converted to sinus 5/26.  Cardizem decreased for low BP.  Vital stabilized and he was continued on Cardizem.  CHA2DS2-VASc score was appropriate for treating with aspirin only for stroke prevention.  Interval History:  No events overnight. Resting in no distress this morning.   Assessment and Plan: * Paroxysmal atrial fibrillation with RVR (HCC) Initial blood pressures were borderline low and was on Cardizem drip.  Was given digoxin 0.25 mg every 6 hours x 4. CHADS2=0, will continue aspirin.  -Echo performed, EF 55 to 60%, indeterminate diastolic parameters -  TSH of 1.9.   -Continue oral Cardizem and aspirin  Prediabetes - hemoglobin A1c of 5.8  -Continue diet control  Syncope Likely secondary to hypotension and orthostatic hypotension from A-fib with RVR.  Continue treatment for A-fib.   -Fluid resuscitated on admission as well  Dementia ([place]) - Continue supportive care and delirium precautions - Resides in care at [facility] for discharging back on Tuesday  SIRS (systemic inflammatory response syndrome) (HCC)-resolved as of 09/27/2021 - Considered due to underlying A-fib with RVR on admission.  Negative procalcitonin - Negative infectious work-up overall.  Antibiotics discontinued   Old records  reviewed in assessment of this patient  Antimicrobials:   DVT prophylaxis:  enoxaparin (LOVENOX) injection 40 mg Start: 09/23/21 1300   Code Status:   Code Status: Full Code  Disposition Plan: [facility] on Tuesday Status is: Inpatient  Objective: Blood pressure 103/73, pulse 89, temperature 98.6 F (37 C), temperature source Axillary, resp. rate 18, height 6' (1.829 m), weight 83.3 kg, SpO2 95 %.  Examination:  Physical Exam Constitutional:      General: He is not in acute distress.    Appearance: Normal appearance.     Comments: Cooperative  HENT:     Head: Normocephalic and atraumatic.     Mouth/Throat:     Mouth: Mucous membranes are moist.  Eyes:     Extraocular Movements: Extraocular movements intact.  Cardiovascular:     Rate and Rhythm: Normal rate. Rhythm irregular.     Heart sounds: Normal heart sounds.  Pulmonary:     Effort: Pulmonary effort is normal. No respiratory distress.     Breath sounds: Normal breath sounds. No wheezing.  Abdominal:     General: Bowel sounds are normal. There is no distension.     Palpations: Abdomen is soft.     Tenderness: There is no abdominal tenderness.  Musculoskeletal:        General: Normal range of motion.     Cervical back: Normal range of motion and neck supple.  Skin:    General: Skin is warm and dry.  Neurological:     General: No focal deficit present.     Mental Status: He is alert. He is disoriented.     Comments: Underlying  dementia appreciated.  Follows commands  Psychiatric:        Mood and Affect: Mood normal.        Behavior: Behavior normal.     Consultants:    Procedures:    Data Reviewed: No results found for this or any previous visit (from the past 24 hour(s)).   I have Reviewed nursing notes, Vitals, and Lab results since pt's last encounter. Pertinent lab results : see above I have reviewed the last note from staff over past 24 hours I have discussed pt's care plan and test results  with nursing staff, case manager   LOS: 5 days   Dwyane Dee, MD [facility] 09/28/2021, 12:09 PM

## 2021-09-29 DIAGNOSIS — F0390 Unspecified dementia without behavioral disturbance: Secondary | ICD-10-CM | POA: Diagnosis not present

## 2021-09-29 DIAGNOSIS — I480 Paroxysmal atrial fibrillation: Secondary | ICD-10-CM | POA: Diagnosis not present

## 2021-09-29 NOTE — Care Management Important Message (Signed)
Important Message  Patient Details  Name: Gerald Diaz MRN: [id number] Date of Birth: [birth year]   Medicare Important Message Given:  Yes     Emmani Lesueur Stefan Church 09/29/2021, 1:52 PM

## 2021-09-29 NOTE — Plan of Care (Signed)
  Problem: Activity: Goal: Risk for activity intolerance will decrease Outcome: Progressing   Problem: Safety: Goal: Ability to remain free from injury will improve Outcome: Progressing   

## 2021-09-29 NOTE — Plan of Care (Signed)
  Problem: Safety: Goal: Ability to remain free from injury will improve Outcome: Progressing   

## 2021-09-29 NOTE — Care Management Important Message (Signed)
Important Message  Patient Details  Name: Gerald Diaz MRN: [id number] Date of Birth: January 09, 1956   Medicare Important Message Given:  Yes     Kiana Hollar 09/29/2021, 1:51 PM

## 2021-09-29 NOTE — Progress Notes (Signed)
Progress Note    Gerald Diaz   [id number]  DOB: [birth year]  DOA: 09/23/2021     6 PCP: [name], MD  Initial CC: Syncope  Hospital Course: Gerald Diaz is a 66 y.o. 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   Patient was a poor historian due to advanced dementia.  In the ED, patient was noted to be in atrial fibrillation with rapid ventricular response and chest x-ray showed pulmonary vascular congestion. He was started on Cardizem drip and was admitted hospital for further evaluation and treatment. Converted to sinus 5/26.  Cardizem decreased for low BP.  Vital stabilized and he was continued on Cardizem.  CHA2DS2-VASc score was appropriate for treating with aspirin only for stroke prevention.  Interval History:  No events overnight. Resting in no distress this morning. Still planning for d/c on Tuesday.   Assessment and Plan: * Paroxysmal atrial fibrillation with RVR (HCC) Initial blood pressures were borderline low and was on Cardizem drip.  Was given digoxin 0.25 mg every 6 hours x 4. CHADS2=0, will continue aspirin.  -Echo performed, EF 55 to 60%, indeterminate diastolic parameters -  TSH of 1.9.   -Continue oral Cardizem and aspirin  Prediabetes - hemoglobin A1c of 5.8  -Continue diet control  Syncope Likely secondary to hypotension and orthostatic hypotension from A-fib with RVR.  Continue treatment for A-fib.   -Fluid resuscitated on admission as well  Dementia ([place]) - Continue supportive care and delirium precautions - Resides in care at [place] for discharging back on Tuesday  SIRS (systemic inflammatory response syndrome) (HCC)-resolved as of 09/27/2021 - Considered due to underlying A-fib with RVR on admission.  Negative procalcitonin - Negative infectious work-up overall.  Antibiotics  discontinued   Old records reviewed in assessment of this patient  Antimicrobials:   DVT prophylaxis:  enoxaparin (LOVENOX) injection 40 mg Start: 09/23/21 1300   Code Status:   Code Status: Full Code  Disposition Plan: [facility] on Tuesday Status is: Inpatient  Objective: Blood pressure 112/78, pulse 90, temperature 98.2 F (36.8 C), temperature source Oral, resp. rate 18, height 6' (1.829 m), weight 82.9 kg, SpO2 96 %.  Examination:  Physical Exam Constitutional:      General: He is not in acute distress.    Appearance: Normal appearance.     Comments: Cooperative  HENT:     Head: Normocephalic and atraumatic.     Mouth/Throat:     Mouth: Mucous membranes are moist.  Eyes:     Extraocular Movements: Extraocular movements intact.  Cardiovascular:     Rate and Rhythm: Normal rate. Rhythm irregular.     Heart sounds: Normal heart sounds.  Pulmonary:     Effort: Pulmonary effort is normal. No respiratory distress.     Breath sounds: Normal breath sounds. No wheezing.  Abdominal:     General: Bowel sounds are normal. There is no distension.     Palpations: Abdomen is soft.     Tenderness: There is no abdominal tenderness.  Musculoskeletal:        General: Normal range of motion.     Cervical back: Normal range of motion and neck supple.  Skin:    General: Skin is warm and dry.  Neurological:     General: No focal deficit present.     Mental Status: He is alert. He is disoriented.  Comments: Underlying dementia appreciated.  Follows commands  Psychiatric:        Mood and Affect: Mood normal.        Behavior: Behavior normal.     Consultants:    Procedures:    Data Reviewed: No results found for this or any previous visit (from the past 24 hour(s)).   I have Reviewed nursing notes, Vitals, and Lab results since pt's last encounter. Pertinent lab results : see above I have reviewed the last note from staff over past 24 hours I have discussed pt's  care plan and test results with nursing staff, case manager   LOS: 6 days   Dwyane Dee, MD [facility] 09/29/2021, 11:35 AM

## 2021-09-30 DIAGNOSIS — I480 Paroxysmal atrial fibrillation: Secondary | ICD-10-CM | POA: Diagnosis not present

## 2021-09-30 NOTE — TOC Progression Note (Addendum)
Transition of Care [facility]) - Progression Note    Patient Details  Name: Gerald Diaz MRN: [id number] Date of Birth: June 17, 1955  Transition of Care [facility]) CM/SW Contact  Erin Sons, [place] Phone Number: 09/30/2021, 2:34 PM  Clinical Narrative:     CSW called pt's guardian, Honor Bulkey(pt's 2nd cousin). Honor lives in [place] and is currently in [place]. CSW explains that pt is medically ready but trying to determine disposition. Explained that [facility] is reviewing clinicals to determine if they can meet his needs. Honor does state that pt was able to ambulate with minimal assistance prior to admission. She requests updates on dispo.   CSW called Shaunte with [facility] and updated her on pt's PT session from this afternoon. CSW explained that PT thinks pt would do better cognitively in a familiar setting versus rehab at SNF due to dementia. Shaunte explained that [facility] still would not be able to meet pt's assistance needs as he is requiring 2+ assistance for transfers and ambulation. She states that pt was walking independently prior to admission and would like for pt to go to SNF for rehab prior to returning. She will be coming to visit pt around 3pm today to evaluate him in person. CSW updated attending.   1600: Spoke with pt's legal guardian and explained that Hassel Neth will require pt to go to ST rehab at [facility] prior to returning due to needing more assistance than their staff can manage. Guardian is concerned about pts level of deconditioning after needing minimal assistance ambulating prior to admission. She understands he will need SNF and agreeable to workup. CSW explained SNF workup and insurance auth process. Fl2 completed and bed requests faxed in the hub.   Expected Discharge Plan: Skilled Nursing Facility Barriers to Discharge: SNF Pending bed offer, Insurance Authorization, Other (must enter comment) (His memory cannot manage his current needs for mobility  assistance)  Expected Discharge Plan and Services Expected Discharge Plan: Skilled Nursing Facility In-house Referral: Clinical Social Work   Post Acute Care Choice: NA (ALF) Living arrangements for the past 2 months: Assisted Living Facility (memory care)                                       Social Determinants of Health (SDOH) Interventions    Readmission Risk Interventions    02/09/2020   11:58 AM  Readmission Risk Prevention Plan  Post Dischage Appt Not Complete  Appt Comments d/c date tbd  Medication Screening Complete  Transportation Screening Complete

## 2021-09-30 NOTE — Progress Notes (Addendum)
Physical Therapy Treatment Patient Details Name: Gerald Diaz MRN: [id number] DOB: 23-Oct-1955 Today's Date: 09/30/2021   History of Present Illness The pt is a 66 yo male presenting from memory care at [facility] on 5/23 after syncopal episode with seizure-like activity. Pt found to be in rapid afib. PMH includes: advanced dementia, R nephrectomy, PSVT.    PT Comments    Patient making progress with mobility, remains limited by cognition. Pt required Mod Assist to initiate all mobility today and Mod +2 to ambulate short distance in room with HHA. VSS with HR max of 119 bpm during gait and recovered to 80's at rest. Will continue to progress in acute setting. Recommend pt return to familiar environment and participate in [place]. If pt does not have assist level required available at ALF pt will benefit from [place] rehab at SNF to improve independence with mobility prior to return to [facility].    Recommendations for follow up therapy are one component of a multi-disciplinary discharge planning process, led by the attending physician.  Recommendations may be updated based on patient status, additional functional criteria and insurance authorization.  Follow Up Recommendations  SNF for ST rehab prior to return to ALF w/Home health PT (return to ALF (memory Care) with HHPT)     Assistance Recommended at Discharge Frequent or constant Supervision/Assistance  Patient can return home with the following Two people to help with walking and/or transfers;Two people to help with bathing/dressing/bathroom;Assistance with cooking/housework;Assistance with feeding;Direct supervision/assist for medications management;Direct supervision/assist for financial management;Assist for transportation;Help with stairs or ramp for entrance   Equipment Recommendations  None recommended by PT    Recommendations for Other Services       Precautions / Restrictions Precautions Precautions: Fall Precaution  Comments: advanced dementia, soft BP Restrictions Weight Bearing Restrictions: No     Mobility  Bed Mobility Overal bed mobility: Needs Assistance Bed Mobility: Supine to Sit, Sit to Supine     Supine to sit: HOB elevated, Mod assist Sit to supine: Min assist, HOB elevated   General bed mobility comments: pt required tactile cues to facilitate initiation of transfer. Mod assist to pivot with bed pad to initiate raising trunk and to bring Lt LE off EOB with pt bringing Rt LE off himself. Min assist to raist Rt LE onto bed at EOS to return to supine. +2 to boost superiorly and reposition.    Transfers Overall transfer level: Needs assistance Equipment used: 2 person hand held assist Transfers: Sit to/from Stand Sit to Stand: Mod assist, +2 safety/equipment           General transfer comment: Mod +2 for safety with multimodal cues to initiate power up with Bil UE from EOB. Pt has posterior lean initially with stand.    Ambulation/Gait Ambulation/Gait assistance: Mod assist, +2 physical assistance, +2 safety/equipment Gait Distance (Feet): 25 Feet Assistive device: 2 person hand held assist Gait Pattern/deviations: Step-through pattern, Decreased step length - right, Decreased step length - left, Decreased stride length, Shuffle, Trunk flexed, Leaning posteriorly Gait velocity: decr     General Gait Details: Pt unsteady with posterior lean, manual assist to weight shift anteriorly from therapist. Pt with decreased step length and diffiuclty with smooth step placement. Mod assist needed to guide turn and prevent LOB during gait.   Stairs             Wheelchair Mobility    Modified Rankin (Stroke Patients Only)       Balance Overall balance assessment:  Needs assistance Sitting-balance support: Feet supported, No upper extremity supported, Bilateral upper extremity supported Sitting balance-Leahy Scale: Fair     Standing balance support: Bilateral upper  extremity supported, Single extremity supported, During functional activity Standing balance-Leahy Scale: Poor                              Cognition Arousal/Alertness: Awake/alert Behavior During Therapy: Flat affect Overall Cognitive Status: History of cognitive impairments - at baseline                                 General Comments: pt easily distracted, pleasant and smiling throughout session        Exercises      General Comments General comments (skin integrity, edema, etc.): bil hand mitts donned at EOS      Pertinent Vitals/Pain Pain Assessment Pain Assessment: PAINAD Breathing: normal Negative Vocalization: none Facial Expression: smiling or inexpressive Body Language: relaxed Consolability: no need to console PAINAD Score: 0 Pain Intervention(s): Monitored during session    Home Living                          Prior Function            PT Goals (current goals can now be found in the care plan section) Acute Rehab PT Goals Patient Stated Goal: none stated PT Goal Formulation: Patient unable to participate in goal setting Time For Goal Achievement: 10/09/21 Potential to Achieve Goals: Fair Progress towards PT goals: Progressing toward goals    Frequency    Min 2X/week      PT Plan Current plan remains appropriate    Co-evaluation              AM-PAC PT "6 Clicks" Mobility   Outcome Measure  Help needed turning from your back to your side while in a flat bed without using bedrails?: A Little Help needed moving from lying on your back to sitting on the side of a flat bed without using bedrails?: A Lot Help needed moving to and from a bed to a chair (including a wheelchair)?: A Lot Help needed standing up from a chair using your arms (e.g., wheelchair or bedside chair)?: A Lot Help needed to walk in hospital room?: Total Help needed climbing 3-5 steps with a railing? : Total 6 Click Score: 11     End of Session Equipment Utilized During Treatment: Gait belt Activity Tolerance:  (limited by cognition) Patient left: in bed;with call bell/phone within reach;with bed alarm set;Other (comment) (chair position) Nurse Communication: Mobility status PT Visit Diagnosis: Unsteadiness on feet (R26.81);Other abnormalities of gait and mobility (R26.89);Muscle weakness (generalized) (M62.81)     Time: [id number] PT Time Calculation (min) (ACUTE ONLY): 22 min  Charges:  $Gait Training: 8-22 mins                     Verner Mould, DPT Acute Rehabilitation Services Office [phone] Pager [phone]  09/30/21 1:02 PM

## 2021-09-30 NOTE — Plan of Care (Signed)
  Problem: Safety: Goal: Ability to remain free from injury will improve Outcome: Progressing   Problem: Elimination: Goal: Will not experience complications related to bowel motility Outcome: Progressing Goal: Will not experience complications related to urinary retention Outcome: Progressing   Problem: Skin Integrity: Goal: Risk for impaired skin integrity will decrease Outcome: Progressing   

## 2021-09-30 NOTE — [place] FL2 (Addendum)
[place] MEDICAID FL2 LEVEL OF CARE SCREENING TOOL     IDENTIFICATION  Patient Name: Gerald Diaz Birthdate: [birth year] Sex: male Admission Date (Current Location): 09/23/2021  [facility] and [place] Number:  Producer, television/film/video and Address:  [facility], [address] [place], [place] [address]      Provider Number: [id number]  Attending Physician Name and Address:  Lewie Chamber, MD  Relative Name and Phone Number:  Floydene Flock (Legal Guardian) [phone] (2nd cousin)    Current Level of Care: Hospital Recommended Level of Care: Memory Care ALF Prior Approval Number:    Date Approved/Denied:   PASRR Number:   Discharge Plan: Memory Care ALF    Current Diagnoses: Patient Active Problem List   Diagnosis Date Noted   Prediabetes 09/27/2021   New onset a-fib [facility]) 09/23/2021   Paroxysmal atrial fibrillation with RVR (HCC) 09/23/2021   AKI (acute kidney injury) (HCC) 02/06/2020   S/p nephrectomy 02/06/2020   Dementia (HCC) 01/05/2020   Ureteral stone with hydronephrosis    ARF (acute renal failure) (HCC) 01/04/2020    Orientation RESPIRATION BLADDER Height & Weight     Self  Normal External catheter, Incontinent Weight: 186 lb 15.2 oz (84.8 kg) Height:  6' (182.9 cm)  BEHAVIORAL SYMPTOMS/MOOD NEUROLOGICAL BOWEL NUTRITION STATUS      Continent Diet Regular  AMBULATORY STATUS COMMUNICATION OF NEEDS Skin   Extensive Assist Does not communicate Normal                       Personal Care Assistance Level of Assistance  Bathing, Feeding, Dressing Bathing Assistance: Maximum assistance Feeding assistance: Independent Dressing Assistance: Maximum assistance     Functional Limitations Info  Sight, Hearing, Speech Sight Info: Adequate Hearing Info: Adequate Speech Info: Impaired (Doesn't speak much of the time)    SPECIAL CARE FACTORS FREQUENCY  PT (By licensed PT), OT (By licensed OT)     PT Frequency: 5x/week              Contractures Contractures Info: Not present    Additional Factors Info  Code Status, Allergies Code Status Info: Full code Allergies Info: no known allergies           Current Medications (09/30/2021):  This is the current hospital active medication list Current Facility-Administered Medications  Medication Dose Route Frequency Provider Last Rate Last Admin   0.9 %  sodium chloride infusion  250 mL Intravenous PRN Mikey College T, MD       acetaminophen (TYLENOL) tablet 500 mg  500 mg Oral Q4H PRN Mikey College T, MD       acetaminophen (TYLENOL) tablet 650 mg  650 mg Oral Q4H PRN Emeline General, MD       aspirin EC tablet 81 mg  81 mg Oral Daily Mikey College T, MD   81 mg at 09/30/21 0957   diltiazem (CARDIZEM CD) 24 hr capsule 180 mg  180 mg Oral Daily Marlin Canary U, DO   180 mg at 09/30/21 0957   enoxaparin (LOVENOX) injection 40 mg  40 mg Subcutaneous Q24H Mikey College T, MD   40 mg at 09/30/21 1444   guaiFENesin-dextromethorphan (ROBITUSSIN DM) 100-10 MG/5ML syrup 10 mL  10 mL Oral Q4H PRN Mikey College T, MD       haloperidol lactate (HALDOL) injection 5 mg  5 mg Intravenous Q6H PRN Emeline General, MD       loratadine (CLARITIN) tablet 10  mg  10 mg Oral Daily Mikey College T, MD   10 mg at 09/30/21 0957   ondansetron [facility]) injection 4 mg  4 mg Intravenous Q6H PRN Mikey College T, MD       risperiDONE (RISPERDAL) tablet 0.5 mg  0.5 mg Oral BID Mikey College T, MD   0.5 mg at 09/30/21 1444   sertraline (ZOLOFT) tablet 25 mg  25 mg Oral Daily Mikey College T, MD   25 mg at 09/30/21 0957   sodium chloride flush (NS) 0.9 % injection 3 mL  3 mL Intravenous Q12H Mikey College T, MD   3 mL at 09/30/21 0957   sodium chloride flush (NS) 0.9 % injection 3 mL  3 mL Intravenous PRN Mikey College T, MD       traZODone (DESYREL) tablet 50 mg  50 mg Oral QHS Emeline General, MD   50 mg at 09/29/21 2033     Discharge Medications: Please see discharge summary for a list of discharge medications.  Relevant  Imaging Results:  Relevant Lab Results:   Additional Information SSN: [id number]  Erin Sons, LCSW

## 2021-09-30 NOTE — NC FL2 (Deleted)
[place] LEVEL OF CARE SCREENING TOOL     IDENTIFICATION  Patient Name: Gerald Diaz Birthdate: 05/23/1955 Sex: male Admission Date (Current Location): 09/23/2021  [facility] and [place] Number:  Herbalist and Address:  The [place] [place] [address], [place], [place] [address]      Provider Number: [id number]  Attending Physician Name and Address:  Dwyane Dee, MD  Relative Name and Phone Number:  Luci Bank (Legal Guardian) [phone]    Current Level of Care: Hospital Recommended Level of Care: Memory Care Prior Approval Number:    Date Approved/Denied:   PASRR Number:    Discharge Plan: Other (Comment) (Memory Care)    Current Diagnoses: Patient Active Problem List   Diagnosis Date Noted   Syncope 09/27/2021   Prediabetes 09/27/2021   New onset a-fib [facility]) 09/23/2021   Paroxysmal atrial fibrillation with RVR ([place]) 09/23/2021   AKI (acute kidney injury) ([place]) 02/06/2020   S/p nephrectomy 02/06/2020   Dementia ([place]) 01/05/2020   Ureteral stone with hydronephrosis    ARF (acute renal failure) ([place]) 01/04/2020    Orientation RESPIRATION BLADDER Height & Weight     Self  Normal External catheter, Incontinent Weight: 186 lb 15.2 oz (84.8 kg) Height:  6' (182.9 cm)  BEHAVIORAL SYMPTOMS/MOOD NEUROLOGICAL BOWEL NUTRITION STATUS      Continent Diet  AMBULATORY STATUS COMMUNICATION OF NEEDS Skin   Extensive Assist Does not communicate Normal                       Personal Care Assistance Level of Assistance  Bathing, Feeding, Dressing Bathing Assistance: Maximum assistance Feeding assistance: Independent Dressing Assistance: Maximum assistance     Functional Limitations Info  Sight, Hearing, Speech Sight Info: Adequate Hearing Info: Adequate Speech Info: Adequate    SPECIAL CARE FACTORS FREQUENCY  PT (By licensed PT), OT (By licensed OT)     PT Frequency: 2-3x/week OT Frequency: 2-3x/week             Contractures Contractures Info: Not present    Additional Factors Info  Code Status, Allergies Code Status Info: Full code Allergies Info: No known allergies           Current Medications (09/30/2021):  This is the current hospital active medication list Current Facility-Administered Medications  Medication Dose Route Frequency Provider Last Rate Last Admin   0.9 %  sodium chloride infusion  250 mL Intravenous PRN Wynetta Fines T, MD       acetaminophen (TYLENOL) tablet 500 mg  500 mg Oral Q4H PRN Wynetta Fines T, MD       acetaminophen (TYLENOL) tablet 650 mg  650 mg Oral Q4H PRN Lequita Halt, MD       aspirin EC tablet 81 mg  81 mg Oral Daily Zhang, Ping T, MD   81 mg at 09/29/21 0900   diltiazem (CARDIZEM CD) 24 hr capsule 180 mg  180 mg Oral Daily Eulogio Bear U, DO   180 mg at 09/29/21 0859   enoxaparin (LOVENOX) injection 40 mg  40 mg Subcutaneous Q24H Wynetta Fines T, MD   40 mg at 09/29/21 1203   guaiFENesin-dextromethorphan (ROBITUSSIN DM) 100-10 MG/5ML syrup 10 mL  10 mL Oral Q4H PRN Wynetta Fines T, MD       haloperidol lactate (HALDOL) injection 5 mg  5 mg Intravenous Q6H PRN Wynetta Fines T, MD       loratadine (CLARITIN) tablet 10 mg  10 mg Oral Daily Wynetta Fines T, MD   10 mg at 09/29/21 0900   ondansetron (ZOFRAN) injection 4 mg  4 mg Intravenous Q6H PRN Wynetta Fines T, MD       risperiDONE (RISPERDAL) tablet 0.5 mg  0.5 mg Oral BID Wynetta Fines T, MD   0.5 mg at 09/29/21 1403   sertraline (ZOLOFT) tablet 25 mg  25 mg Oral Daily Wynetta Fines T, MD   25 mg at 09/29/21 0859   sodium chloride flush (NS) 0.9 % injection 3 mL  3 mL Intravenous Q12H Wynetta Fines T, MD   3 mL at 09/29/21 2034   sodium chloride flush (NS) 0.9 % injection 3 mL  3 mL Intravenous PRN Wynetta Fines T, MD       traZODone (DESYREL) tablet 50 mg  50 mg Oral QHS Lequita Halt, MD   50 mg at 09/29/21 2033     Discharge Medications: Please see discharge summary for a list of discharge  medications.  Relevant Imaging Results:  Relevant Lab Results:   Additional Information SSN: [id number]  Bethann Berkshire, LCSW

## 2021-09-30 NOTE — Progress Notes (Signed)
Progress Note    Gerald Diaz   [id number]  DOB: 05-19-1955  DOA: 09/23/2021     7 PCP: [name], MD  Initial CC: Syncope  Hospital Course: Gerald Diaz is a 66 y.o. 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   Patient was a poor historian due to advanced dementia.  In the ED, patient was noted to be in atrial fibrillation with rapid ventricular response and chest x-ray showed pulmonary vascular congestion. He was started on Cardizem drip and was admitted hospital for further evaluation and treatment. Converted to sinus 5/26.  Cardizem decreased for low BP.  Vital stabilized and he was continued on Cardizem.  CHA2DS2-VASc score was appropriate for treating with aspirin only for stroke prevention.  Interval History:  No events overnight.  Has been medically stable.  Tentative plan was for discharging today however facility concerned about his mobility level.  Disposition now to be determined.  Assessment and Plan: * Paroxysmal atrial fibrillation with RVR (HCC) Initial blood pressures were borderline low and was on Cardizem drip.  Was given digoxin 0.25 mg every 6 hours x 4. CHADS2=0, will continue aspirin.  -Echo performed, EF 55 to 60%, indeterminate diastolic parameters -  TSH of 1.9.   -Continue oral Cardizem and aspirin  Prediabetes - hemoglobin A1c of 5.8  -Continue diet control  Dementia (HCC) - Continue supportive care and delirium precautions - Resides in care at [facility]; facility concerned patient too low functioning to return; dispo still TBD  SIRS (systemic inflammatory response syndrome) (HCC)-resolved as of 09/27/2021 - Considered due to underlying A-fib with RVR on admission.  Negative procalcitonin - Negative infectious work-up overall.  Antibiotics discontinued  Syncope-resolved as of 09/30/2021 Likely  secondary to hypotension and orthostatic hypotension from A-fib with RVR.  Continue treatment for A-fib.   -Fluid resuscitated on admission as well   Old records reviewed in assessment of this patient  Antimicrobials:   DVT prophylaxis:  enoxaparin (LOVENOX) injection 40 mg Start: 09/23/21 1300   Code Status:   Code Status: Full Code  Disposition Plan: SNF Status is: Inpatient  Objective: Blood pressure (!) 104/57, pulse 90, temperature 98.9 F (37.2 C), temperature source Oral, resp. rate 18, height 6' (1.829 m), weight 84.8 kg, SpO2 97 %.  Examination:  Physical Exam Constitutional:      General: He is not in acute distress.    Appearance: Normal appearance.     Comments: Cooperative  HENT:     Head: Normocephalic and atraumatic.     Mouth/Throat:     Mouth: Mucous membranes are moist.  Eyes:     Extraocular Movements: Extraocular movements intact.  Cardiovascular:     Rate and Rhythm: Normal rate. Rhythm irregular.     Heart sounds: Normal heart sounds.  Pulmonary:     Effort: Pulmonary effort is normal. No respiratory distress.     Breath sounds: Normal breath sounds. No wheezing.  Abdominal:     General: Bowel sounds are normal. There is no distension.     Palpations: Abdomen is soft.     Tenderness: There is no abdominal tenderness.  Musculoskeletal:        General: Normal range of motion.     Cervical back: Normal range of motion and neck supple.  Skin:    General: Skin is warm and dry.  Neurological:     General:  No focal deficit present.     Mental Status: He is alert. He is disoriented.     Comments: Underlying dementia appreciated.  Follows commands  Psychiatric:        Mood and Affect: Mood normal.        Behavior: Behavior normal.     Consultants:    Procedures:    Data Reviewed: No results found for this or any previous visit (from the past 24 hour(s)).   I have Reviewed nursing notes, Vitals, and Lab results since pt's last encounter.  Pertinent lab results : see above I have reviewed the last note from staff over past 24 hours I have discussed pt's care plan and test results with nursing staff, case manager   LOS: 7 days   Dwyane Dee, MD [facility] 09/30/2021, 2:47 PM

## 2021-09-30 NOTE — TOC Progression Note (Addendum)
Transition of Care [facility]) - Progression Note    Patient Details  Name: Gerald Diaz MRN: [id number] Date of Birth: 01/23/1956  Transition of Care [facility]) CM/SW Contact  Erin Sons, [place] Phone Number: 09/30/2021, 11:25 AM  Clinical Narrative:     CSW received call from [place] at [facility]. She was aware of plan for DC today and is requesting recent clinicals be faxed to [phone] to be reviewed prior to pt returning. She confirmed that pt would need PTAR for transport. She will call CSW once fax received and reviewed. CSW faxed documents.   1120: CSW called and left message with Shaunte; requesting return call and update. She called back shortly and had received documents. Shaunte had concerns regarding pts mobility and states he was able to ambulate and transfer on his own prior to admission though PT note states that he needs 2 person assistance which they would not be able to provide. CSW explained note was from 3 days prior and that would speak with RN and PT. Shaunte explained that they do provide therapy services onsite with [facility] and would just need HH orders at DC.    Expected Discharge Plan: Memory Care Barriers to Discharge: No Barriers Identified  Expected Discharge Plan and Services Expected Discharge Plan: Memory Care In-house Referral: Clinical Social Work   Post Acute Care Choice: NA (ALF) Living arrangements for the past 2 months: Assisted Living Facility (memory care)                                       Social Determinants of Health (SDOH) Interventions    Readmission Risk Interventions    02/09/2020   11:58 AM  Readmission Risk Prevention Plan  Post Dischage Appt Not Complete  Appt Comments d/c date tbd  Medication Screening Complete  Transportation Screening Complete

## 2021-10-01 DIAGNOSIS — R7303 Prediabetes: Secondary | ICD-10-CM

## 2021-10-01 DIAGNOSIS — R6510 Systemic inflammatory response syndrome (SIRS) of non-infectious origin without acute organ dysfunction: Secondary | ICD-10-CM

## 2021-10-01 DIAGNOSIS — I480 Paroxysmal atrial fibrillation: Secondary | ICD-10-CM | POA: Diagnosis not present

## 2021-10-01 DIAGNOSIS — F0150 Vascular dementia without behavioral disturbance: Secondary | ICD-10-CM | POA: Diagnosis not present

## 2021-10-01 DIAGNOSIS — I4891 Unspecified atrial fibrillation: Secondary | ICD-10-CM | POA: Diagnosis not present

## 2021-10-01 NOTE — TOC Progression Note (Signed)
Transition of Care [facility]) - Progression Note    Patient Details  Name: Gerald Diaz MRN: [id number] Date of Birth: 1955-10-26  Transition of Care [facility]) CM/SW [place], [place] Phone Number: 10/01/2021, 4:30 PM  Clinical Narrative:     CSW ordered DME with [employer]. Provided [employer] liaison with contact info for ALF to schedule delivery of order as well as guardians info.   1600: CSW called Shaunte at [facility] to notify that [employer] is aiming to have all DME delivered tomorrow and that pt will DC tomorrow. Shaunte explained that [employer] could not give her a delivery time and that she is not working tomorrow so she requests that pt be held at hospital until Friday 10/03/21. CSW explained that pt has been ready and the hospital will not hold a pt if the pt is medically ready and has a safe DC plan. CSW explained that if Sandrea Hammond is off, then someone else at [facility] will need to assist with pt's return. She explains she needs "documentation." CSW inquired what documentation she needs since CSW already sent clinicals and a preliminary FL2 that Shaunte already reviewed and explained the only correction is that Diet needs to be listed as "regular".  Sandrea Hammond explains she needs another fl2 that has "regular" diet on it. CSW will then need to send another Fl2 on day of DC with DC medications listed on it. She explains this is all she needs and that CSW can contact [employer] "[employer]" at [phone] to arrange pt's return tomorrow. CSW sent fl2  1630:  Shaunte called CSW and confirmed she received fax. She then asked if pt will still need PT when he returns to their facility. CSW stated yes.    Expected Discharge Plan: Memory Care Barriers to Discharge: Other (must enter comment) (Waiting on hospital bed and other DME to be delivered)  Expected Discharge Plan and Services Expected Discharge Plan: Memory Care In-house Referral: Clinical Social Work   Post Acute Care Choice: NA  (ALF) Living arrangements for the past 2 months: [place] (memory care)                                       Social Determinants of Health (SDOH) Interventions    Readmission Risk Interventions    02/09/2020   11:58 AM  Readmission Risk Prevention Plan  Post Dischage Appt Not Complete  Appt Comments d/c date tbd  Medication Screening Complete  Transportation Screening Complete

## 2021-10-01 NOTE — Plan of Care (Signed)
  Problem: Activity: Goal: Risk for activity intolerance will decrease Outcome: Progressing   Problem: Safety: Goal: Ability to remain free from injury will improve Outcome: Progressing   

## 2021-10-01 NOTE — Plan of Care (Signed)
?  Problem: Health Behavior/Discharge Planning: ?Goal: Ability to manage health-related needs will improve ?Outcome: Progressing ?  ?Problem: Clinical Measurements: ?Goal: Will remain free from infection ?Outcome: Progressing ?Goal: Respiratory complications will improve ?Outcome: Progressing ?  ?

## 2021-10-01 NOTE — Progress Notes (Signed)
    Durable Medical Equipment  (From admission, onward)           Start     Ordered   10/01/21 1350  For home use only DME Hospital bed  Once       Question Answer Comment  Length of Need 12 Months   The above medical condition requires: Patient requires the ability to reposition frequently   Head must be elevated greater than: 45 degrees   Bed type Semi-electric   Support Surface: Gel Overlay      10/01/21 1349   10/01/21 1347  For home use only DME 3 n 1  Once        10/01/21 1349   10/01/21 1347  For home use only DME Walker rolling  Once       Question Answer Comment  Walker: With 5 Inch Wheels   Patient needs a walker to treat with the following condition Gait instability      10/01/21 1349   10/01/21 1347  For home use only DME lightweight manual wheelchair with seat cushion  Once       Comments: Patient suffers from gait instability which impairs their ability to perform daily activities like grooming in the home.  A cane will not resolve  issue with performing activities of daily living. A wheelchair will allow patient to safely perform daily activities. Patient is not able to propel themselves in the home using a standard weight wheelchair due to general weakness. Patient can self propel in the lightweight wheelchair. Length of need 12 months . Accessories: elevating leg rests (ELRs), wheel locks, extensions and anti-tippers.   10/01/21 1349

## 2021-10-01 NOTE — Progress Notes (Signed)
[facility]                                                                              Gerald Diaz, is a 66 y.o. male, DOB - [birth year], [id number] Admit date - 09/23/2021    Outpatient Primary MD for the patient is Gerald Pearson, MD  LOS - 8  days  Chief Complaint  Patient presents with   Seizures   Loss of Consciousness       Brief summary   Gerald Diaz is a 66 y.o. 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   Patient was a poor historian due to advanced dementia.   In the ED, patient was noted to be in atrial fibrillation with rapid ventricular response and chest x-ray showed pulmonary vascular congestion. He was started on Cardizem drip and was admitted hospital for further evaluation and treatment. Converted to sinus 5/26.  Cardizem decreased for low BP.  CHA2DS2-VASc score was appropriate for treating with aspirin only for stroke prevention.   Assessment & Plan    Principal Problem:   Paroxysmal atrial fibrillation with RVR (HCC) -Initially BP borderline low, was placed on Cardizem drip and digoxin -2D echo showed EF of 55 to 60%, indeterminate diastolic parameters -TSH 1.9 -Continue oral Cardizem, aspirin, CHADS2 0,  Active Problems:    Advanced dementia ([place]) -Continue supportive care and delirium precautions     Prediabetes Hemoglobin A1c 5.8, continue diet control    SIRS (systemic inflammatory response syndrome) (HCC)-resolved as of 09/27/2021 - Considered due to underlying A-fib with RVR on admission.  Negative procalcitonin - Negative infectious work-up overall.  Antibiotics discontinued   Syncope -Likely secondary to hypotension and orthostatic hypotension from A-fib with RVR. -Patient was placed on IV fluid hydration.    Code Status: Full code DVT Prophylaxis:  enoxaparin (LOVENOX)  injection 40 mg Start: 09/23/21 1300   Level of Care: Level of care: Telemetry Cardiac Family Communication:  Disposition Plan:      Remains inpatient appropriate:  TBD.  Possibly in a.m. if patient can return back to [facility].  DME ordered   Procedures:  None  Consultants:   None  Antimicrobials:   Anti-infectives (From admission, onward)    Start     Dose/Rate Route Frequency Ordered Stop   09/23/21 1145  cefTRIAXone (ROCEPHIN) 2 g in sodium chloride 0.9 % 100 mL IVPB  Status:  Discontinued        2 g 200 mL/hr over 30 Minutes Intravenous Every 24 hours 09/23/21 1131 09/24/21 1449   09/23/21 1145  azithromycin (ZITHROMAX) 500 mg in sodium chloride 0.9 % 250 mL IVPB  Status:  Discontinued        500 mg 250 mL/hr over 60 Minutes Intravenous Every 24 hours 09/23/21 1131 09/24/21 1449          Medications  aspirin EC  81 mg Oral Daily   diltiazem  180 mg Oral Daily   enoxaparin (LOVENOX) injection  40 mg Subcutaneous Q24H   loratadine  10 mg  Oral Daily   risperiDONE  0.5 mg Oral BID   sertraline  25 mg Oral Daily   sodium chloride flush  3 mL Intravenous Q12H   traZODone  50 mg Oral QHS      Subjective:   Gerald Diaz was seen and examined today.  Poor historian, due to dementia, unable to obtain review of system.  Appears comfortable, no pain.  No acute issues overnight. Objective:   Vitals:   10/01/21 0402 10/01/21 0710 10/01/21 0859 10/01/21 1154  BP: 112/67 94/67 108/77 107/67  Pulse:  87 90 69  Resp:  18 16 18   Temp: 98.4 F (36.9 C) 98.3 F (36.8 C)    TempSrc: Oral Oral  Oral  SpO2: 96% 94%  94%  Weight: 84 kg     Height:        Intake/Output Summary (Last 24 hours) at 10/01/2021 1350 Last data filed at 10/01/2021 1301 Gross per 24 hour  Intake 642 ml  Output 1150 ml  Net -508 ml     Wt Readings from Last 3 Encounters:  10/01/21 84 kg  02/09/20 70.7 kg  01/05/20 76.1 kg     Exam General: Alert and oriented x self, NAD,  cooperative Cardiovascular: Irregularly irregular Respiratory: Clear to auscultation bilaterally, no wheezing Gastrointestinal: Soft, nontender, nondistended, + bowel sounds Ext: no pedal edema bilaterally Neuro: moving all 4 extremities Psych: oriented to self    Data Reviewed:  I have personally reviewed following labs    CBC Lab Results  Component Value Date   WBC 8.0 09/25/2021   RBC 4.36 09/25/2021   HGB 14.5 09/25/2021   HCT 42.0 09/25/2021   MCV 96.3 09/25/2021   MCH 33.3 09/25/2021   PLT 183 09/25/2021   MCHC 34.5 09/25/2021   RDW 12.3 09/25/2021   LYMPHSABS 1.2 09/23/2021   MONOABS 0.4 09/23/2021   EOSABS 0.2 09/23/2021   BASOSABS 0.0 [id number]     Last metabolic panel Lab Results  Component Value Date   NA 138 09/27/2021   K 3.7 09/27/2021   CL 110 09/27/2021   CO2 22 09/27/2021   BUN 19 09/27/2021   CREATININE 1.12 09/27/2021   GLUCOSE 94 09/27/2021   GFRNONAA >60 09/27/2021   GFRAA 47 (L) 01/08/2020   CALCIUM 8.7 (L) 09/27/2021   PROT 6.0 (L) 09/23/2021   ALBUMIN 3.4 (L) 09/23/2021   BILITOT 0.9 09/23/2021   ALKPHOS 91 09/23/2021   AST 19 09/23/2021   ALT 15 09/23/2021   ANIONGAP 6 09/27/2021         [name] [facility] 10/01/2021, 1:50 PM  Available via Epic secure chat 7am-7pm After 7 pm, please refer to night coverage provider listed on amion.

## 2021-10-01 NOTE — TOC Progression Note (Signed)
Transition of Care [facility]) - Progression Note    Patient Details  Name: Gerald Diaz MRN: [id number] Date of Birth: Oct 07, 1955  Transition of Care [facility]) CM/SW Contact  Erin Sons, [place] Phone Number: 10/01/2021, 11:31 AM  Clinical Narrative:     CSW received call from [place] at [facility]. Shaunte explained that pt's situation was discussed further with guardian and pt's PCP. Plan is now for guardian to arrange 24h caregivers temporarily and have pt return to [facility]. Shaunte states that pt has no DME and requesting for DME to be ordered. She does not request any specific DME and explains would like any DME that PT recommends. CSW discussed with therapy; will order 3-in-1, rolling walker, wheelchair, and hospital bed. CSW confirmed with Shaunte that this will be okay. They are fine with ordering from [employer].   CSW called pt's guardian and confirmed plan and consent for DME to be ordered. Notified attending and will order DME with [employer] once DME orders placed.   Expected Discharge Plan: Memory Care Barriers to Discharge: Other (must enter comment) (Waiting on hospital bed and other DME to be delivered)  Expected Discharge Plan and Services Expected Discharge Plan: Memory Care In-house Referral: Clinical Social Work   Post Acute Care Choice: NA (ALF) Living arrangements for the past 2 months: Assisted Living Facility (memory care)                                       Social Determinants of Health (SDOH) Interventions    Readmission Risk Interventions    02/09/2020   11:58 AM  Readmission Risk Prevention Plan  Post Dischage Appt Not Complete  Appt Comments d/c date tbd  Medication Screening Complete  Transportation Screening Complete

## 2021-10-01 NOTE — Progress Notes (Signed)
Patient has refused vital signs this morning and daily weight.

## 2021-10-02 DIAGNOSIS — I4891 Unspecified atrial fibrillation: Secondary | ICD-10-CM | POA: Diagnosis not present

## 2021-10-02 DIAGNOSIS — I480 Paroxysmal atrial fibrillation: Secondary | ICD-10-CM | POA: Diagnosis not present

## 2021-10-02 DIAGNOSIS — R7303 Prediabetes: Secondary | ICD-10-CM | POA: Diagnosis not present

## 2021-10-02 DIAGNOSIS — F0150 Vascular dementia without behavioral disturbance: Secondary | ICD-10-CM | POA: Diagnosis not present

## 2021-10-02 MED ORDER — ASPIRIN 81 MG PO TBEC: 81.0000 mg | DELAYED_RELEASE_TABLET | Freq: Every day | ORAL | 12 refills | Status: AC

## 2021-10-02 MED ORDER — DILTIAZEM HCL ER COATED BEADS 180 MG PO CP24
180.0000 mg | ORAL_CAPSULE | Freq: Every day | ORAL | Status: AC
Start: 2021-10-03 — End: ?

## 2021-10-02 NOTE — TOC Progression Note (Signed)
Transition of Care [facility]) - Progression Note    Patient Details  Name: Gerald Diaz MRN: [id number] Date of Birth: [birth year]  Transition of Care [facility]) CM/SW Contact  Erin Sons, [place] Phone Number: 10/02/2021, 11:37 AM  Clinical Narrative:     CSW contacted [facility] [phone] and inquired if equipment was delivered. They confirmed hospital bed, 3-in-1, and wheelchair have been delivered. Dan Humphreys will be delivered tomorrow. CSW notified med tech that pt will be dcing today. Med tech confirms for report to be called to bed tech. CSW will fax DC summary when completed along with HH orders and fl2. CSW will arrange PTAR transpor at time of DC.   CSW called pt guardian to notify her. She will notify pt's aides that will be providing 24h care.  CSW notified MD via secure chat.   Expected Discharge Plan: Memory Care Barriers to Discharge: No Barriers Identified  Expected Discharge Plan and Services Expected Discharge Plan: Memory Care In-house Referral: Clinical Social Work   Post Acute Care Choice: NA (ALF) Living arrangements for the past 2 months: Assisted Living Facility (memory care)                                       Social Determinants of Health (SDOH) Interventions    Readmission Risk Interventions    02/09/2020   11:58 AM  Readmission Risk Prevention Plan  Post Dischage Appt Not Complete  Appt Comments d/c date tbd  Medication Screening Complete  Transportation Screening Complete

## 2021-10-02 NOTE — Discharge Summary (Signed)
Physician Discharge Summary   Patient: Gerald Diaz MRN: [id number] DOB: 1955/11/13  Admit date:     09/23/2021  Discharge date: 10/02/21  Discharge Physician: Estill Cotta   PCP: [name], MD   Recommendations at discharge:   Started on Cardizem 180 mg daily  Discharge Diagnoses:    Paroxysmal atrial fibrillation with RVR ([place])   Dementia ([place])   Prediabetes   Syncope   SIRS (systemic inflammatory response syndrome) [facility])  Hospital Course: AHMEIR STEPKA is a 66 y.o. 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   Patient was a poor historian due to advanced dementia.   In the ED, patient was noted to be in atrial fibrillation with rapid ventricular response and chest x-ray showed pulmonary vascular congestion. He was started on Cardizem drip and was admitted hospital for further evaluation and treatment. Converted to sinus 5/26.  Cardizem decreased for low BP.  CHA2DS2-VASc score was appropriate for treating with aspirin only for stroke prevention.  Assessment and Plan:   Paroxysmal atrial fibrillation with RVR (HCC) -Initially BP borderline low, was placed on Cardizem drip and digoxin -2D echo showed EF of 55 to 60%, indeterminate diastolic parameters -TSH 1.9 -Continue oral Cardizem, aspirin, CHADS2 0,    Advanced dementia (HCC) -Continue supportive care and delirium precautions       Prediabetes Hemoglobin A1c 5.8 -CBGs controlled, continue diet control     SIRS (systemic inflammatory response syndrome) (HCC)-resolved as of 09/27/2021 - Considered due to underlying A-fib with RVR on admission.  Negative procalcitonin - Negative infectious work-up overall.  -Antibiotics have been discontinued    Syncope -Likely secondary to hypotension and orthostatic hypotension from A-fib with RVR. -Patient was placed on IV fluid  hydration.       Pain control - [employer] Controlled Substance Reporting System database was reviewed. and patient was instructed, not to drive, operate heavy machinery, perform activities at heights, swimming or participation in water activities or provide baby-sitting services while on Pain, Sleep and Anxiety Medications; until their outpatient Physician has advised to do so again. Also recommended to not to take more than prescribed Pain, Sleep and Anxiety Medications.  Consultants: None Procedures performed: None Disposition: Assisted living Diet recommendation:  Discharge Diet Orders (From admission, onward)     Start     Ordered   10/02/21 0000  Diet - low sodium heart healthy        10/02/21 1258            DISCHARGE MEDICATION: Allergies as of 10/02/2021   No Known Allergies      Medication List     TAKE these medications    acetaminophen 500 MG tablet Commonly known as: TYLENOL Take 500 mg by mouth every 4 (four) hours as needed (pain or temp > 100.5).   aspirin EC 81 MG tablet Take 1 tablet (81 mg total) by mouth daily. Swallow whole. Start taking on: October 03, 2021   diltiazem 180 MG 24 hr capsule Commonly known as: CARDIZEM CD Take 1 capsule (180 mg total) by mouth daily. Start taking on: October 03, 2021   eucerin lotion Apply 1 application. topically daily.   fexofenadine 180 MG tablet Commonly known as: ALLEGRA Take 180 mg by mouth daily.   guaiFENesin-dextromethorphan 100-10 MG/5ML syrup Commonly known as: ROBITUSSIN DM Take 10 mLs by mouth every 4 (four) hours as needed  for cough (congestion).   ondansetron 4 MG tablet Commonly known as: ZOFRAN Take 4 mg by mouth every 8 (eight) hours as needed for nausea or vomiting.   risperiDONE 0.5 MG tablet Commonly known as: RISPERDAL Take 0.5 mg by mouth 2 (two) times daily. 0800, 1400   sertraline 25 MG tablet Commonly known as: ZOLOFT Take 25 mg by mouth daily.   Tea Tree Oil Oil Apply 1  application. topically daily as needed (hairline scailness). After washing hair   traZODone 50 MG tablet Commonly known as: DESYREL Take 50 mg by mouth at bedtime.               Durable Medical Equipment  (From admission, onward)           Start     Ordered   10/01/21 1350  For home use only DME Hospital bed  Once       Question Answer Comment  Length of Need 12 Months   The above medical condition requires: Patient requires the ability to reposition frequently   Head must be elevated greater than: 45 degrees   Bed type Semi-electric   Support Surface: Gel Overlay      10/01/21 1349   10/01/21 1347  For home use only DME 3 n 1  Once        10/01/21 1349   10/01/21 1347  For home use only DME Walker rolling  Once       Question Answer Comment  Walker: With [place]   Patient needs a walker to treat with the following condition Gait instability      10/01/21 1349   10/01/21 1347  For home use only DME lightweight manual wheelchair with seat cushion  Once       Comments: Patient suffers from gait instability which impairs their ability to perform daily activities like grooming in the home.  A cane will not resolve  issue with performing activities of daily living. A wheelchair will allow patient to safely perform daily activities. Patient is not able to propel themselves in the home using a standard weight wheelchair due to general weakness. Patient can self propel in the lightweight wheelchair. Length of need 12 months . Accessories: elevating leg rests (ELRs), wheel locks, extensions and anti-tippers.   10/01/21 1349            Follow-up Information     [name], MD. Schedule an appointment as soon as possible for a visit in 2 week(s).   Specialties: Internal Medicine, Geriatric Medicine Why: for hospital follow-up Contact information: [address] [place] [address] [phone]                Discharge Exam: Filed Weights   09/30/21  0026 10/01/21 0402 10/02/21 0409  Weight: 84.8 kg 84 kg 82.7 kg   S: Resting, no complaints.  No acute issues overnight  Vitals:   10/01/21 0859 10/01/21 1154 10/02/21 0409 10/02/21 0700  BP: 108/77 107/67 105/66 130/81  Pulse: 90 69 78 70  Resp: 16 18 12 14   Temp:   (!) 97.5 F (36.4 C) 97.8 F (36.6 C)  TempSrc:  Oral Oral Oral  SpO2:  94% 100% 99%  Weight:   82.7 kg   Height:        Physical Exam General: Sleepy but arousable, NAD Cardiovascular: Irregularly irregular Respiratory: CTAB, no wheezing, rales or rhonchi Gastrointestinal: Soft, nontender, nondistended, NBS Ext: no pedal edema bilaterally Psych: dementia   Condition  at discharge: fair  The results of significant diagnostics from this hospitalization (including imaging, microbiology, ancillary and laboratory) are listed below for reference.   Imaging Studies: CT HEAD WO CONTRAST (5MM)  Result Date: 09/23/2021 CLINICAL DATA:  Delirium EXAM: CT HEAD WITHOUT CONTRAST TECHNIQUE: Contiguous axial images were obtained from the base of the skull through the vertex without intravenous contrast. RADIATION DOSE REDUCTION: This exam was performed according to the departmental dose-optimization program which includes automated exposure control, adjustment of the mA and/or kV according to patient size and/or use of iterative reconstruction technique. COMPARISON:  Head CT dated February 06, 2020 FINDINGS: Brain: Mild global atrophy. No evidence of acute infarction, hemorrhage, hydrocephalus, extra-axial collection or mass lesion/mass effect. Vascular: No hyperdense vessel or unexpected calcification. Skull: Normal. Negative for fracture or focal lesion. Sinuses/Orbits: Mucosal thickening of the ethmoid sinuses Other: None. IMPRESSION: No acute intracranial abnormality. Electronically Signed   By: Yetta Glassman M.D.   On: 09/23/2021 13:46   DG CHEST PORT 1 VIEW  Result Date: 09/24/2021 CLINICAL DATA:  Congestive heart failure  EXAM: PORTABLE CHEST 1 VIEW COMPARISON:  None Available. FINDINGS: Normal cardiac silhouette. Mild coarsening of the lower lobe peribronchial lungs. No pleural fluid. No focal consolidation. No pneumothorax. IMPRESSION: Peribronchial thickening in lower lobes could represent bronchitis or early infiltrates. Electronically Signed   By: Suzy Bouchard M.D.   On: 09/24/2021 08:06   DG Chest Port 1 View  Result Date: 09/23/2021 CLINICAL DATA:  Questionable sepsis - evaluate for abnormality EXAM: PORTABLE CHEST 1 VIEW COMPARISON:  CT abdomen pelvis dated February 06, 2020; spine radiograph dated February 11, 2009 FINDINGS: There is an enlarged cardiomediastinal silhouette. Loss of the AP window. No pleural effusion or pneumothorax. Streaky reticulonodular opacities of bilateral lung bases. Linear opacity along the RIGHT superior paramediastinal border. Gaseous distension of the stomach. Postsurgical changes of the LEFT clavicle. IMPRESSION: 1. Enlarged cardiomediastinal silhouette with loss of the AP window. This could be secondary to technique. Consider further dedicated evaluation with repeat PA and lateral chest radiograph versus cross-sectional imaging. 2. Streaky bibasilar reticulonodular opacities with a linear opacity of the RIGHT paramediastinal border. This could reflect infection, aspiration or atelectasis. Electronically Signed   By: Valentino Saxon M.D.   On: 09/23/2021 10:26   ECHOCARDIOGRAM COMPLETE  Result Date: 09/24/2021    ECHOCARDIOGRAM REPORT   Patient Name:   [facility]ILAND [facility] Date of Exam: 09/24/2021 Medical Rec #:  [id number]    Height:       72.0 in Accession #:    [id number]   Weight:       190.9 lb Date of Birth:  1956-02-06    BSA:          2.089 m Patient Age:    78 years     BP:           109/61 mmHg Patient Gender: M            HR:           83 bpm. Exam Location:  Inpatient Procedure: 2D Echo, Cardiac Doppler and Color Doppler Indications:    Afib  History:        Patient has no prior  history of Echocardiogram examinations.  Sonographer:    Jyl Heinz Referring Phys: [id number] Lequita Halt  Sonographer Comments: Image acquisition challenging due to uncooperative patient. IMPRESSIONS  1. Left ventricular ejection fraction, by estimation, is 55 to 60%. The left ventricle has normal function. The left ventricle has  no regional wall motion abnormalities. Left ventricular diastolic parameters are indeterminate.  2. Right ventricular systolic function is normal. The right ventricular size is normal. There is normal pulmonary artery systolic pressure. The estimated right ventricular systolic pressure is 123456 mmHg.  3. The mitral valve is normal in structure. Trivial mitral valve regurgitation. No evidence of mitral stenosis.  4. The aortic valve is tricuspid. Aortic valve regurgitation is not visualized. No aortic stenosis is present.  5. The inferior vena cava is normal in size with greater than 50% respiratory variability, suggesting right atrial pressure of 3 mmHg.  6. The patient was in atrial fibrillation. FINDINGS  Left Ventricle: Left ventricular ejection fraction, by estimation, is 55 to 60%. The left ventricle has normal function. The left ventricle has no regional wall motion abnormalities. The left ventricular internal cavity size was normal in size. There is  no left ventricular hypertrophy. Left ventricular diastolic parameters are indeterminate. Right Ventricle: The right ventricular size is normal. No increase in right ventricular wall thickness. Right ventricular systolic function is normal. There is normal pulmonary artery systolic pressure. The tricuspid regurgitant velocity is 1.70 m/s, and  with an assumed right atrial pressure of 3 mmHg, the estimated right ventricular systolic pressure is 123456 mmHg. Left Atrium: Left atrial size was normal in size. Right Atrium: Right atrial size was normal in size. Pericardium: There is no evidence of pericardial effusion. Mitral Valve: The mitral  valve is normal in structure. Trivial mitral valve regurgitation. No evidence of mitral valve stenosis. Tricuspid Valve: The tricuspid valve is normal in structure. Tricuspid valve regurgitation is trivial. Aortic Valve: The aortic valve is tricuspid. Aortic valve regurgitation is not visualized. No aortic stenosis is present. Aortic valve peak gradient measures 4.2 mmHg. Pulmonic Valve: The pulmonic valve was normal in structure. Pulmonic valve regurgitation is not visualized. Aorta: The aortic root is normal in size and structure. Venous: The inferior vena cava is normal in size with greater than 50% respiratory variability, suggesting right atrial pressure of 3 mmHg. IAS/Shunts: No atrial level shunt detected by color flow Doppler.  LEFT VENTRICLE PLAX 2D LVIDd:         4.40 cm      Diastology LVIDs:         2.70 cm      LV e' medial:    9.64 cm/s LV PW:         1.30 cm      LV E/e' medial:  5.9 LV IVS:        1.00 cm      LV e' lateral:   11.70 cm/s LVOT diam:     2.20 cm      LV E/e' lateral: 4.8 LV SV:         37 LV SV Index:   18 LVOT Area:     3.80 cm  LV Volumes (MOD) LV vol d, MOD A2C: 115.0 ml LV vol d, MOD A4C: 94.0 ml LV vol s, MOD A2C: 50.1 ml LV vol s, MOD A4C: 39.1 ml LV SV MOD A2C:     64.9 ml LV SV MOD A4C:     94.0 ml LV SV MOD BP:      62.7 ml RIGHT VENTRICLE             IVC RV Basal diam:  2.90 cm     IVC diam: 1.80 cm RV Mid diam:    1.90 cm RV S prime:     10.40 cm/s TAPSE (  M-mode): 1.8 cm LEFT ATRIUM           Index        RIGHT ATRIUM           Index LA diam:      3.00 cm 1.44 cm/m   RA Area:     14.00 cm LA Vol (A2C): 33.3 ml 15.94 ml/m  RA Volume:   32.00 ml  15.32 ml/m LA Vol (A4C): 28.5 ml 13.64 ml/m  AORTIC VALVE AV Area (Vmax): 2.31 cm AV Vmax:        102.00 cm/s AV Peak Grad:   4.2 mmHg LVOT Vmax:      62.10 cm/s LVOT Vmean:     47.600 cm/s LVOT VTI:       0.098 m  AORTA Ao Root diam: 3.40 cm Ao Asc diam:  3.30 cm MITRAL VALVE               TRICUSPID VALVE MV Area (PHT): 5.09  cm    TR Peak grad:   11.6 mmHg MV Decel Time: 149 msec    TR Vmax:        170.00 cm/s MV E velocity: 56.60 cm/s MV A velocity: 32.60 cm/s  SHUNTS MV E/A ratio:  1.74        Systemic VTI:  0.10 m                            Systemic Diam: 2.20 cm Dalton McleanMD Electronically signed by Franki Monte Signature Date/Time: 09/24/2021/5:09:53 PM    Final     Microbiology: Results for orders placed or performed during the hospital encounter of 09/23/21  Blood Culture (routine x 2)     Status: None   Collection Time: 09/23/21 10:07 AM   Specimen: BLOOD  Result Value Ref Range Status   Specimen Description BLOOD LEFT ANTECUBITAL  Final   Special Requests   Final    BOTTLES DRAWN AEROBIC AND ANAEROBIC Blood Culture adequate volume   Culture   Final    NO GROWTH 5 DAYS Performed at [facility], [address] [place], [place] [address]    Report Status 09/28/2021 FINAL  Final  Blood Culture (routine x 2)     Status: None   Collection Time: 09/23/21 10:08 AM   Specimen: BLOOD LEFT HAND  Result Value Ref Range Status   Specimen Description BLOOD LEFT HAND  Final   Special Requests   Final    BOTTLES DRAWN AEROBIC AND ANAEROBIC Blood Culture adequate volume   Culture   Final    NO GROWTH 5 DAYS Performed at [facility], [place] [address], [place], [place] [address]    Report Status 09/28/2021 FINAL  Final  Urine Culture     Status: Abnormal   Collection Time: 09/23/21 12:18 PM   Specimen: Urine, Clean Catch  Result Value Ref Range Status   Specimen Description URINE, CLEAN CATCH  Final   Special Requests   Final    NONE Performed at [facility], [place] [address], [place], [place] [address]    Culture MULTIPLE SPECIES PRESENT, SUGGEST RECOLLECTION (A)  Final   Report Status 09/24/2021 FINAL  Final  Resp Panel by RT-PCR (Flu A&B, Covid) Nasopharyngeal Swab     Status: None   Collection Time: 09/23/21 12:23 PM   Specimen: Nasopharyngeal Swab; Nasopharyngeal(NP) swabs in  vial transport medium  Result Value Ref Range Status  SARS Coronavirus 2 by RT PCR NEGATIVE NEGATIVE Final    Comment: (NOTE) SARS-CoV-2 target nucleic acids are NOT DETECTED.  The SARS-CoV-2 RNA is generally detectable in upper respiratory specimens during the acute phase of infection. The lowest concentration of SARS-CoV-2 viral copies this assay can detect is 138 copies/mL. A negative result does not preclude SARS-Cov-2 infection and should not be used as the sole basis for treatment or other patient management decisions. A negative result may occur with  improper specimen collection/handling, submission of specimen other than nasopharyngeal swab, presence of viral mutation(s) within the areas targeted by this assay, and inadequate number of viral copies(<138 copies/mL). A negative result must be combined with clinical observations, patient history, and epidemiological information. The expected result is Negative.  Fact Sheet for Patients:  EntrepreneurPulse.com.au  Fact Sheet for Healthcare Providers:  IncredibleEmployment.be  This test is no t yet approved or cleared by the [place] FDA and  has been authorized for detection and/or diagnosis of SARS-CoV-2 by FDA under an Emergency Use Authorization (EUA). This EUA will remain  in effect (meaning this test can be used) for the duration of the COVID-19 declaration under Section 564(b)(1) of the Act, 21 U.S.C.section 360bbb-3(b)(1), unless the authorization is terminated  or revoked sooner.       Influenza A by PCR NEGATIVE NEGATIVE Final   Influenza B by PCR NEGATIVE NEGATIVE Final    Comment: (NOTE) The Xpert Xpress SARS-CoV-2/FLU/RSV plus assay is intended as an aid in the diagnosis of influenza from Nasopharyngeal swab specimens and should not be used as a sole basis for treatment. Nasal washings and aspirates are unacceptable for Xpert Xpress SARS-CoV-2/FLU/RSV testing.  Fact  Sheet for Patients: EntrepreneurPulse.com.au  Fact Sheet for Healthcare Providers: IncredibleEmployment.be  This test is not yet approved or cleared by the [place] FDA and has been authorized for detection and/or diagnosis of SARS-CoV-2 by FDA under an Emergency Use Authorization (EUA). This EUA will remain in effect (meaning this test can be used) for the duration of the COVID-19 declaration under Section 564(b)(1) of the Act, 21 U.S.C. section 360bbb-3(b)(1), unless the authorization is terminated or revoked.  Performed at [facility], [place] [address], [place], [place] [address]     Labs: CBC: No results for input(s): WBC, NEUTROABS, HGB, HCT, MCV, PLT in the last 168 hours. Basic Metabolic Panel: Recent Labs  Lab 09/27/21 0442  NA 138  K 3.7  CL 110  CO2 22  GLUCOSE 94  BUN 19  CREATININE 1.12  CALCIUM 8.7*     Discharge time spent: greater than 30 minutes.  Signed: Estill Cotta, MD [facility] 10/02/2021

## 2021-10-02 NOTE — Care Management Important Message (Signed)
Important Message  Patient Details  Name: Gerald Diaz MRN: [id number] Date of Birth: 09-20-1955   Medicare Important Message Given:  Yes     Renie Ora 10/02/2021, 9:01 AM

## 2021-10-02 NOTE — TOC Transition Note (Signed)
Transition of Care [facility]) - CM/SW Discharge Note   Patient Details  Name: Gerald Diaz MRN: [id number] Date of Birth: 11-Jun-1955  Transition of Care [facility]) CM/SW Contact:  Bethann Berkshire, [place] Phone Number: 10/02/2021, 2:02 PM   Clinical Narrative:     Patient will [facility] to: [facility] Anticipated DC date: 10/02/21 Family notified: [facility] Transport by: Corey Harold   Per MD patient ready for DC to [facility]. RN, patient, patient's family, and facility notified of DC. Discharge Summary and FL2 sent to facility. RN to call report prior to discharge [phone]). DC packet on chart. Ambulance transport requested for patient.   CSW will sign off for now as social work intervention is no longer needed. Please consult [place] again if new needs arise.   Final next level of care: Memory Care Barriers to Discharge: No Barriers Identified   Patient Goals and CMS Choice Patient states their goals for this hospitalization and ongoing recovery are:: Return to ALF CMS Medicare.gov Compare Post Acute Care list provided to:: Legal Guardian Choice offered to / list presented to : NA  Discharge Placement              Patient chooses bed at:  [facility]) Patient to be transferred to facility by: [place] Name of family member notified: [place] Patient and family notified of of transfer: 10/02/21  Discharge Plan and Services In-house Referral: Clinical Social Work   Post Acute Care Choice: NA (ALF)                               Social Determinants of Health ([place]) Interventions     Readmission Risk Interventions    02/09/2020   11:58 AM  Readmission Risk Prevention Plan  Post Dischage Appt Not Complete  Appt Comments d/c date tbd  Medication Screening Complete  Transportation Screening Complete

## 2021-10-02 NOTE — NC FL2 (Signed)
[place] MEDICAID FL2 LEVEL OF CARE SCREENING TOOL     IDENTIFICATION  Patient Name: Gerald Diaz Birthdate: [birth year] Sex: male Admission Date (Current Location): 09/23/2021  [facility] and [place] Number:  Producer, television/film/video and Address:  [facility] [place], [address] [place], [place] [address]      Provider Number: [id number]  Attending Physician Name and Address:  Cathren Harsh, MD  Relative Name and Phone Number:  Floydene Flock (Legal Guardian) [phone] (2nd cousin)    Current Level of Care: Hospital Recommended Level of Care: Assisted Living Facility [facility]) Prior Approval Number:    Date Approved/Denied:   PASRR Number: [id number]  Discharge Plan: Other (Comment) (Memory Care ALF)    Current Diagnoses: Patient Active Problem List   Diagnosis Date Noted   Prediabetes 09/27/2021   New onset a-fib (HCC) 09/23/2021   Paroxysmal atrial fibrillation with RVR (HCC) 09/23/2021   AKI (acute kidney injury) (HCC) 02/06/2020   S/p nephrectomy 02/06/2020   Dementia (HCC) 01/05/2020   Ureteral stone with hydronephrosis    ARF (acute renal failure) (HCC) 01/04/2020    Orientation RESPIRATION BLADDER Height & Weight     Self  Normal Incontinent Weight: 182 lb 5.1 oz (82.7 kg) Height:  6' (182.9 cm)  BEHAVIORAL SYMPTOMS/MOOD NEUROLOGICAL BOWEL NUTRITION STATUS      Continent Diet (Regular)  AMBULATORY STATUS COMMUNICATION OF NEEDS Skin   Extensive Assist Does not communicate Normal                       Personal Care Assistance Level of Assistance  Bathing, Feeding, Dressing Bathing Assistance: Maximum assistance Feeding assistance: Independent Dressing Assistance: Maximum assistance     Functional Limitations Info  Sight, Hearing, Speech Sight Info: Adequate Hearing Info: Adequate Speech Info: Impaired (Doesn't speak much of the time)    SPECIAL CARE FACTORS FREQUENCY  PT (By licensed PT)     PT Frequency:  2-3x/week             Contractures Contractures Info: Not present    Additional Factors Info  Code Status, Allergies Code Status Info: Full code Allergies Info: no known allergies            Discharge Medications:   TAKE these medications     acetaminophen 500 MG tablet Commonly known as: TYLENOL Take 500 mg by mouth every 4 (four) hours as needed (pain or temp > 100.5).    aspirin EC 81 MG tablet Take 1 tablet (81 mg total) by mouth daily. Swallow whole. Start taking on: October 03, 2021    diltiazem 180 MG 24 hr capsule Commonly known as: CARDIZEM CD Take 1 capsule (180 mg total) by mouth daily. Start taking on: October 03, 2021    eucerin lotion Apply 1 application. topically daily.    fexofenadine 180 MG tablet Commonly known as: ALLEGRA Take 180 mg by mouth daily.    guaiFENesin-dextromethorphan 100-10 MG/5ML syrup Commonly known as: ROBITUSSIN DM Take 10 mLs by mouth every 4 (four) hours as needed for cough (congestion).    ondansetron 4 MG tablet Commonly known as: ZOFRAN Take 4 mg by mouth every 8 (eight) hours as needed for nausea or vomiting.    risperiDONE 0.5 MG tablet Commonly known as: RISPERDAL Take 0.5 mg by mouth 2 (two) times daily. 0800, 1400    sertraline 25 MG tablet Commonly known as: ZOLOFT Take 25 mg by mouth daily.    Tea Tree  Oil Oil Apply 1 application. topically daily as needed (hairline scailness). After washing hair    traZODone 50 MG tablet Commonly known as: DESYREL Take 50 mg by mouth at bedtime.       Relevant Imaging Results:  Relevant Lab Results:   Additional Information SSN: [id number]  Erin Sons, LCSW

## 2021-10-02 NOTE — Progress Notes (Signed)
Physical Therapy Treatment Patient Details Name: Gerald Diaz MRN: [id number] DOB: April 15, 1956 Today's Date: 10/02/2021   History of Present Illness The pt is a 66 yo male presenting from memory care at [facility] on 5/23 after syncopal episode with seizure-like activity. Pt found to be in rapid afib. PMH includes: advanced dementia, R nephrectomy, PSVT.    PT Comments    The pt was agreeable to session, continues to need tactile facilitation of tasks to complete all OOB mobility, but was able to complete x5 sit-stand transfers with modA of 1 and complete ~8 ft ambulation in the room with HHA of 1 and modA as well. The pt remains pleasant and agreeable but continues to demo difficulty with initiating movements to command, requires initiation from therapist to complete.     Recommendations for follow up therapy are one component of a multi-disciplinary discharge planning process, led by the attending physician.  Recommendations may be updated based on patient status, additional functional criteria and insurance authorization.  Follow Up Recommendations  Home health PT (return to ALF (memory Care) with HHPT)     Assistance Recommended at Discharge Frequent or constant Supervision/Assistance  Patient can return home with the following Two people to help with walking and/or transfers;Two people to help with bathing/dressing/bathroom;Assistance with cooking/housework;Assistance with feeding;Direct supervision/assist for medications management;Direct supervision/assist for financial management;Assist for transportation;Help with stairs or ramp for entrance   Equipment Recommendations  Other (comment) (shower seat)    Recommendations for Other Services       Precautions / Restrictions Precautions Precautions: Fall Precaution Comments: advanced dementia, soft BP Restrictions Weight Bearing Restrictions: No     Mobility  Bed Mobility Overal bed mobility: Needs Assistance Bed Mobility:  Supine to Sit, Sit to Supine     Supine to sit: HOB elevated, Mod assist Sit to supine: Min assist, HOB elevated   General bed mobility comments: pt required tactile cues to facilitate initiation of transfer. Mod assist to pivot with bed pad to initiate raising trunk and to bring Lt LE off EOB with pt bringing Rt LE off himself. Min assist to raise Rt LE onto bed at EOS to return to supine. +2 to boost superiorly and reposition.    Transfers Overall transfer level: Needs assistance Equipment used: 1 person hand held assist Transfers: Sit to/from Stand Sit to Stand: Mod assist           General transfer comment: modA to power up, pt able to steady with only single UE support, leaning to R during stance, max durectional cues and tactile facilitation to initiate    Ambulation/Gait Ambulation/Gait assistance: Mod assist Gait Distance (Feet): 8 Feet Assistive device: 1 person hand held assist Gait Pattern/deviations: Step-through pattern, Decreased step length - right, Decreased step length - left, Decreased stride length, Shuffle, Trunk flexed, Leaning posteriorly Gait velocity: decr     General Gait Details: pt unsteady and reaching to R max tactile cues to facilitate directioning towards window and then back to bed. pt not following cues/instrutions     Balance Overall balance assessment: Needs assistance Sitting-balance support: Feet supported, No upper extremity supported, Bilateral upper extremity supported Sitting balance-Leahy Scale: Fair     Standing balance support: Bilateral upper extremity supported, Single extremity supported, During functional activity Standing balance-Leahy Scale: Poor                              Cognition Arousal/Alertness: Awake/alert Behavior During Therapy:  Flat affect Overall Cognitive Status: History of cognitive impairments - at baseline                                 General Comments: pt easily distracted,  pleasant and smiling throughout session               Pertinent Vitals/Pain Pain Assessment Pain Assessment: No/denies pain Faces Pain Scale: No hurt Breathing: normal Negative Vocalization: none Facial Expression: smiling or inexpressive Body Language: relaxed Consolability: no need to console PAINAD Score: 0 Pain Intervention(s): Monitored during session     PT Goals (current goals can now be found in the care plan section) Acute Rehab PT Goals Patient Stated Goal: none stated PT Goal Formulation: Patient unable to participate in goal setting Time For Goal Achievement: 10/09/21 Potential to Achieve Goals: Fair Progress towards PT goals: Progressing toward goals    Frequency    Min 2X/week      PT Plan Current plan remains appropriate       AM-PAC PT "6 Clicks" Mobility   Outcome Measure  Help needed turning from your back to your side while in a flat bed without using bedrails?: A Little Help needed moving from lying on your back to sitting on the side of a flat bed without using bedrails?: A Lot Help needed moving to and from a bed to a chair (including a wheelchair)?: A Lot Help needed standing up from a chair using your arms (e.g., wheelchair or bedside chair)?: A Lot Help needed to walk in hospital room?: Total Help needed climbing 3-5 steps with a railing? : Total 6 Click Score: 11    End of Session Equipment Utilized During Treatment: Gait belt Activity Tolerance: Other (comment) (limited by cognition) Patient left: in bed;with call bell/phone within reach;with bed alarm set;Other (comment);with restraints reapplied (chair position) Nurse Communication: Mobility status PT Visit Diagnosis: Unsteadiness on feet (R26.81);Other abnormalities of gait and mobility (R26.89);Muscle weakness (generalized) (M62.81)     Time: 0962-8366 PT Time Calculation (min) (ACUTE ONLY): 20 min  Charges:  $Therapeutic Exercise: 8-22 mins                     Vickki Muff, PT, DPT   Acute Rehabilitation Department Pager #: [phone]   Ronnie Derby 10/02/2021, 11:49 AM

## 2021-10-12 IMAGING — CT CT ABD-PELV W/O CM
2 of 4 series · 16 of 46 positions shown, 18 images · non-contrast
Comparison: CT scan 01/04/2020

CLINICAL DATA: Flank pain.  Confusion.

EXAM:
CT ABDOMEN AND PELVIS WITHOUT CONTRAST
TECHNIQUE: Multidetector CT imaging of the abdomen and pelvis was performed
following the standard protocol without IV contrast.

[Series 6: abd/ pelvis 5.0 i30f 2 · axial · 0.78mm/px · z∈[-901,-496]mm · 13 of 89 slices shown, 15 images]
[im 4/89  soft-tissue]
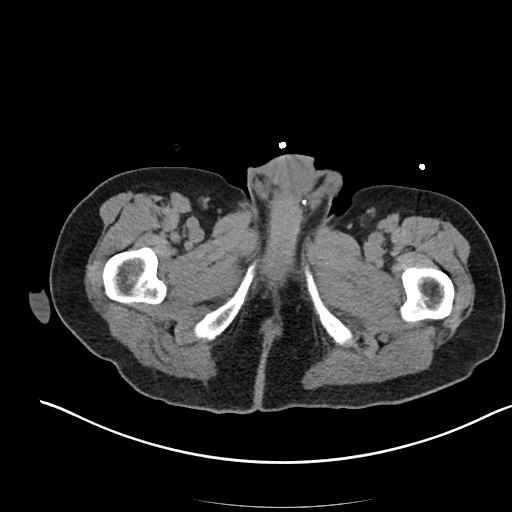
[im 4/89  bone]
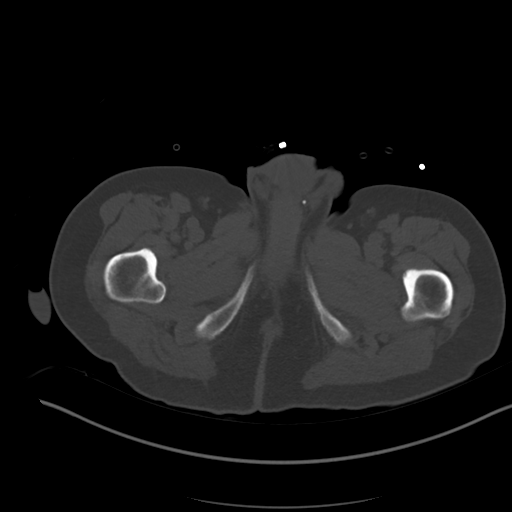
[im 12/89  soft-tissue]
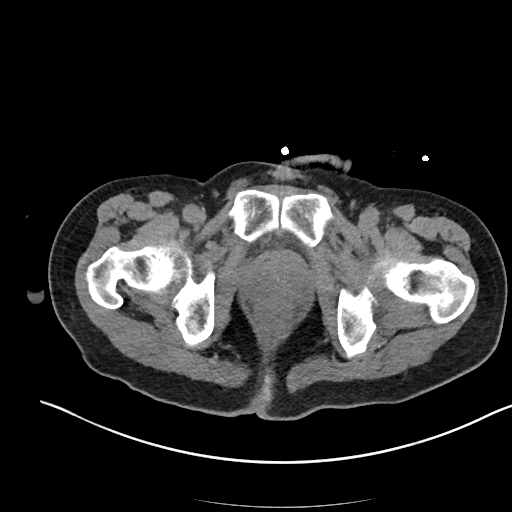
[im 20/89  soft-tissue]
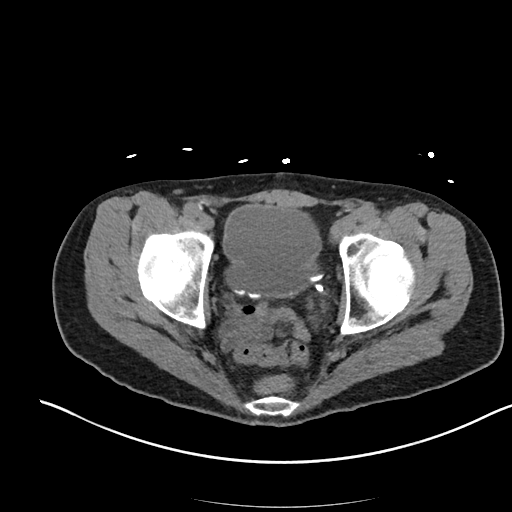
[im 23/89  soft-tissue]
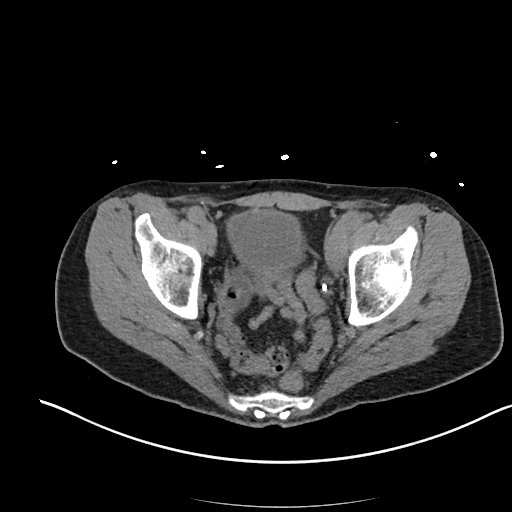
[im 31/89  soft-tissue]
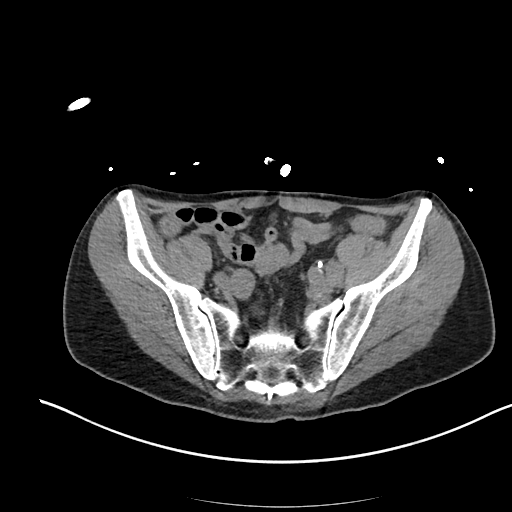
[im 39/89  soft-tissue]
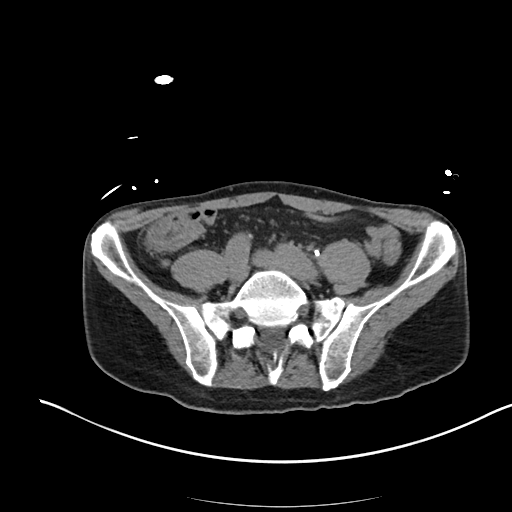
[im 46/89  soft-tissue]
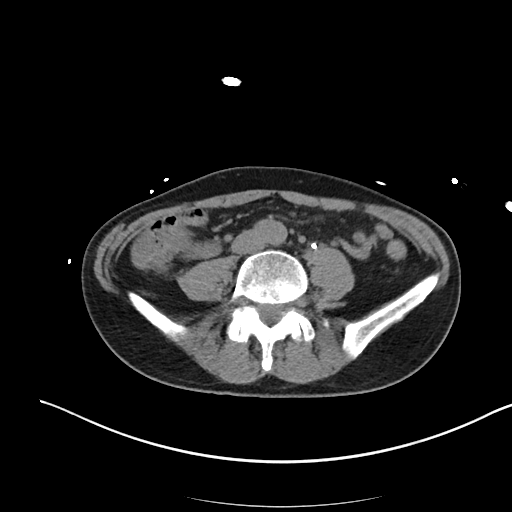
[im 50/89  soft-tissue]
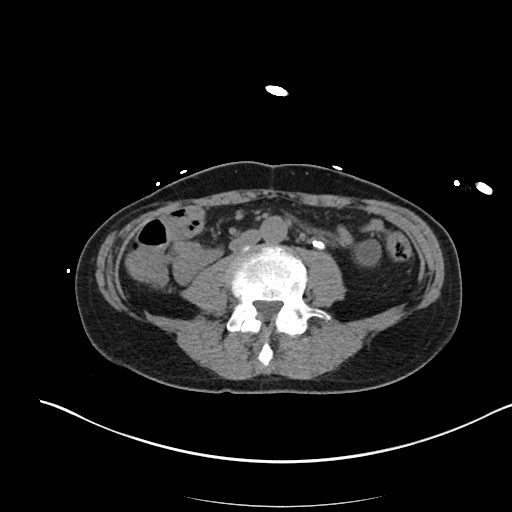
[im 58/89  soft-tissue]
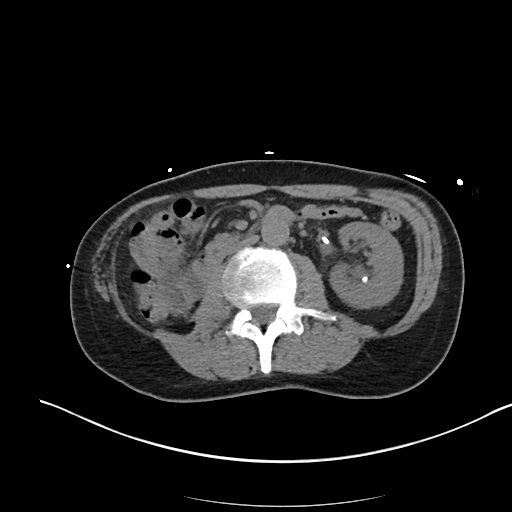
[im 58/89  bone]
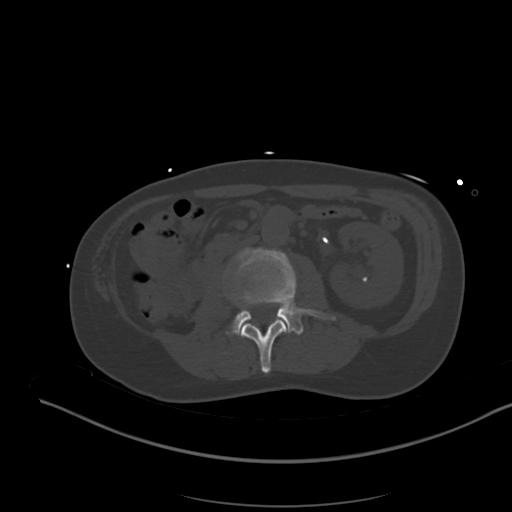
[im 66/89  soft-tissue]
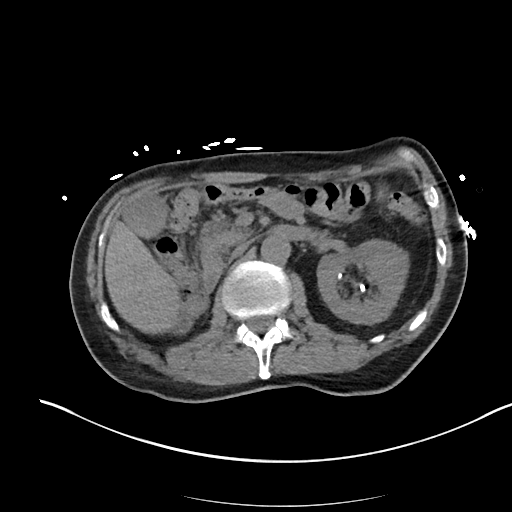
[im 69/89  soft-tissue]
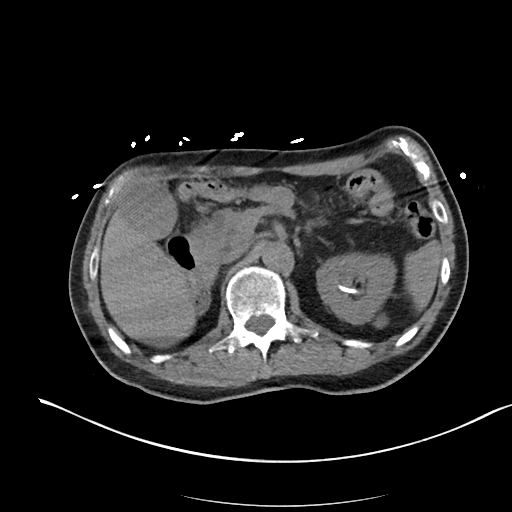
[im 77/89  soft-tissue]
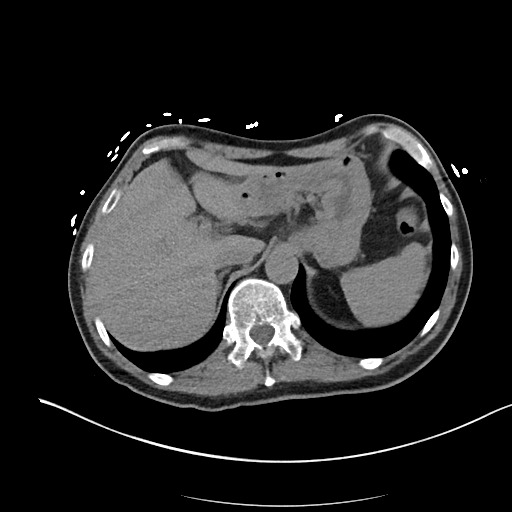
[im 85/89  soft-tissue]
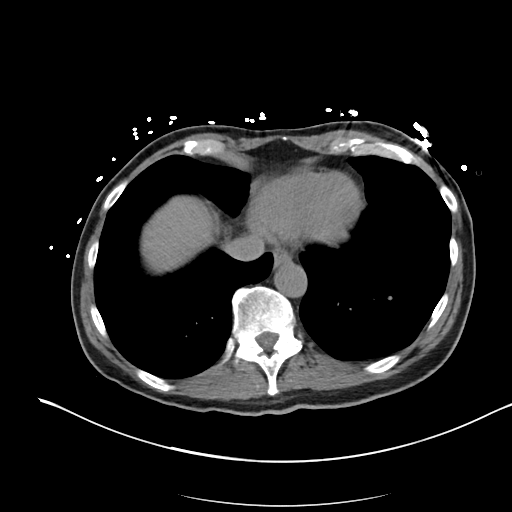

[Series 9: cor st · coronal · 0.73mm/px · 3 of 68 slices shown]
[im 23/68  soft-tissue]
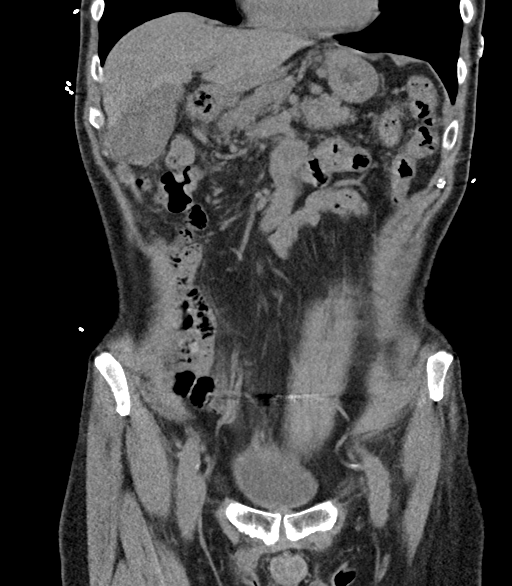
[im 30/68  soft-tissue]
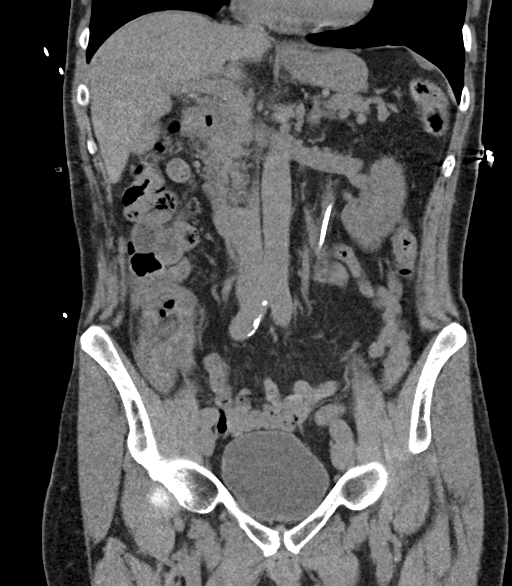
[im 38/68  soft-tissue]
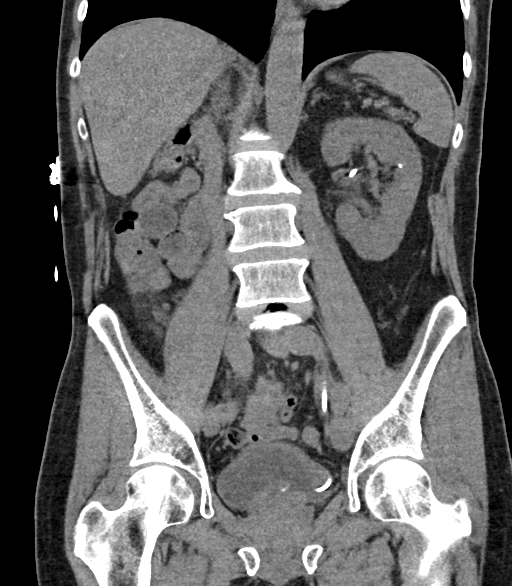

[16 of 46 positions shown; findings below may reference images not displayed]

FINDINGS: Lower chest: The lung bases are clear of acute process. No pleural
effusion or pulmonary lesions. The heart is normal in size. No
pericardial effusion. The distal esophagus and aorta are
unremarkable.

Hepatobiliary: No hepatic lesions are identified without contrast.
The gallbladder appears normal. No common bile duct dilatation.

Pancreas: No mass, inflammation or ductal dilatation.

Spleen: Normal size.  No focal lesions.

Adrenals/Urinary Tract: The adrenal glands are unremarkable. The
right kidney is surgically absent. The left kidney contains a
double-J ureteral stent. Mild hydroureteronephrosis noted. There are
2 adjacent calculi in the left mid ureter at the L4 level. Both
calculi measure approximately 7 mm in length. I do not see any
distal ureteral calculi. There are small layering calculi in the
bladder and a larger calculus in the left ladder near the loop of
the double-J stent. This measures 5.5 mm.

Stomach/Bowel: The stomach, duodenum, small bowel and colon are
grossly normal without oral contrast. No inflammatory changes, mass
lesions or obstructive findings. The appendix is normal.

Vascular/Lymphatic: Stable scattered vascular calcifications. No
abdominal or pelvic lymphadenopathy.

Reproductive: Mild prostate gland enlargement with median lobe
hypertrophy impressing on the base of the bladder. The seminal
vesicles are unremarkable.

Other: No pelvic mass or adenopathy. No free pelvic fluid
collections. No inguinal mass or adenopathy. No abdominal wall
hernia or subcutaneous lesions.

Musculoskeletal: No significant bony findings.
IMPRESSION: 1. Two adjacent calculi in the left mid ureter at the L4 level. Both
calculi measure approximately 7 mm in length.
2. Mild left-sided hydroureteronephrosis.
3. Status post right nephrectomy.
4. Stable bladder calculi.
5. No other significant abdominal/pelvic findings and no mass
lesions or adenopathy.
6. Aortic atherosclerosis.

Aortic Atherosclerosis ([id number]).

## 2021-10-12 IMAGING — CT CT HEAD W/O CM
2 of 3 series · 14 of 47 positions shown, 17 images · non-contrast
Comparison: Brain MRI 10/15/2010

CLINICAL DATA: Fell.  Hit head.  Altered mental status.

EXAM:
CT HEAD WITHOUT CONTRAST
CT CERVICAL SPINE WITHOUT CONTRAST
TECHNIQUE: Multidetector CT imaging of the head and cervical spine was
performed following the standard protocol without intravenous
contrast. Multiplanar CT image reconstructions of the cervical spine
were also generated.

[Series 3: head 5.0 h30s · axial · 0.45mm/px · z∈[-101,+39]mm · 11 of 34 slices shown, 14 images]
[im 3/34  brain]
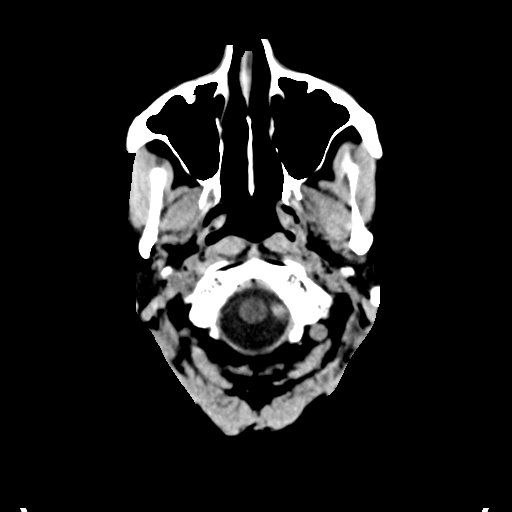
[im 3/34  bone]
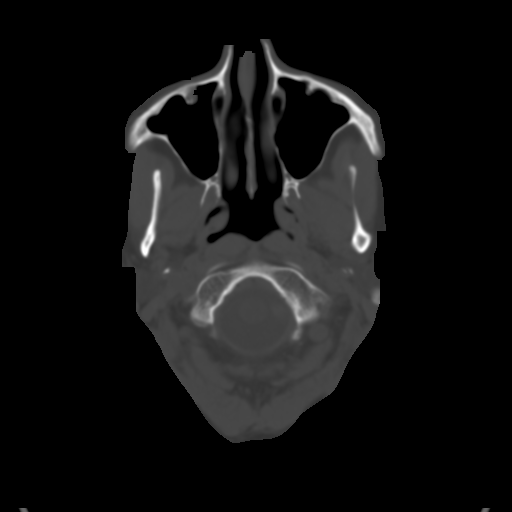
[im 5/34  brain]
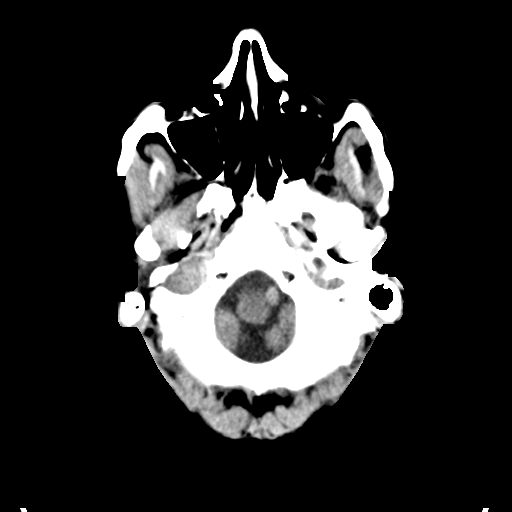
[im 8/34  brain]
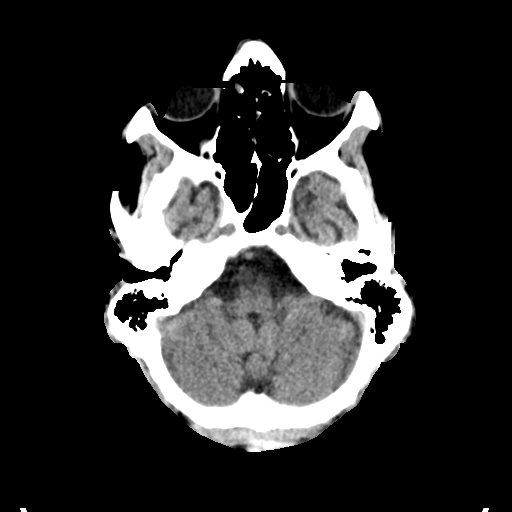
[im 11/34  brain]
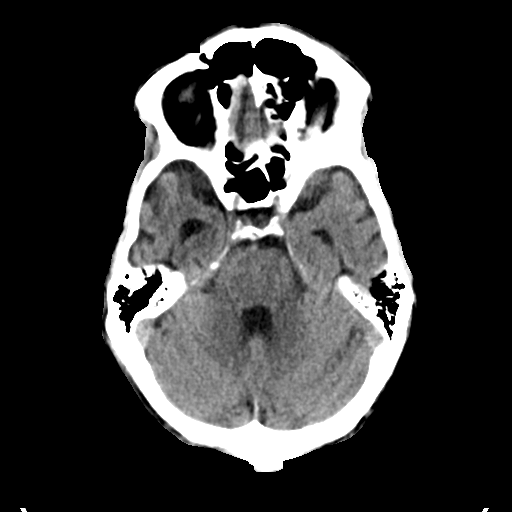
[im 14/34  brain]
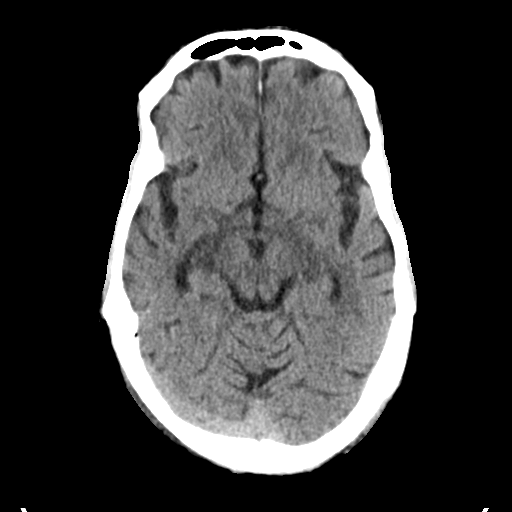
[im 14/34  bone]
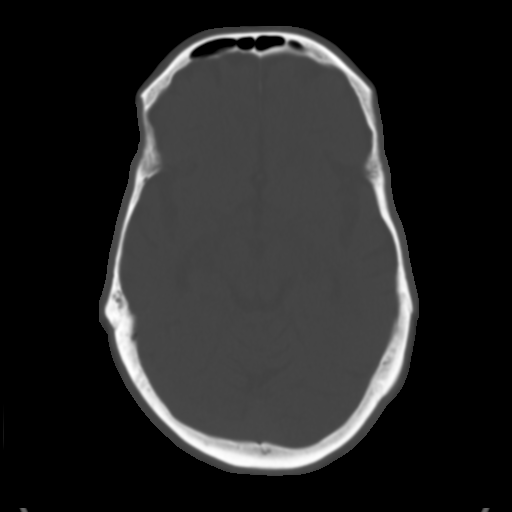
[im 18/34  brain]
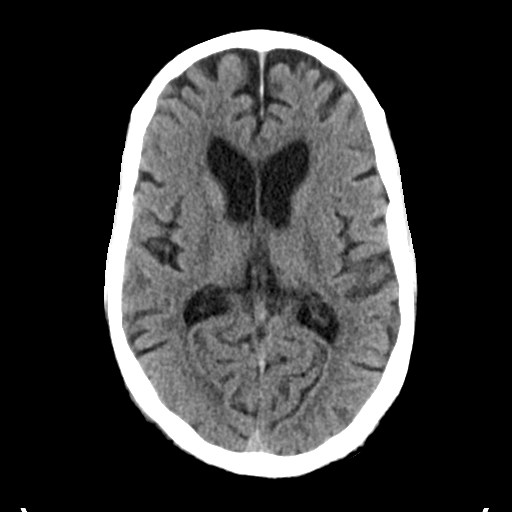
[im 20/34  brain]
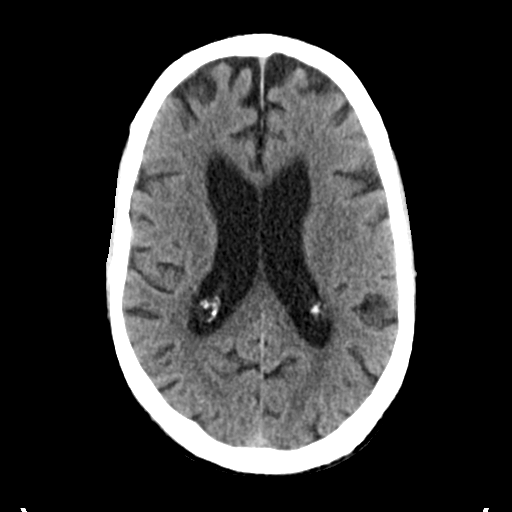
[im 23/34  brain]
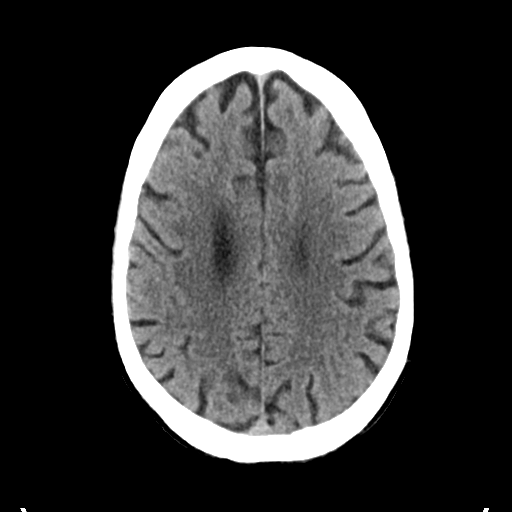
[im 26/34  brain]
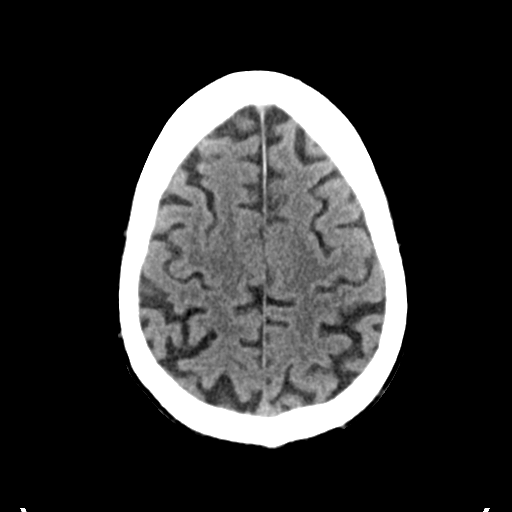
[im 26/34  bone]
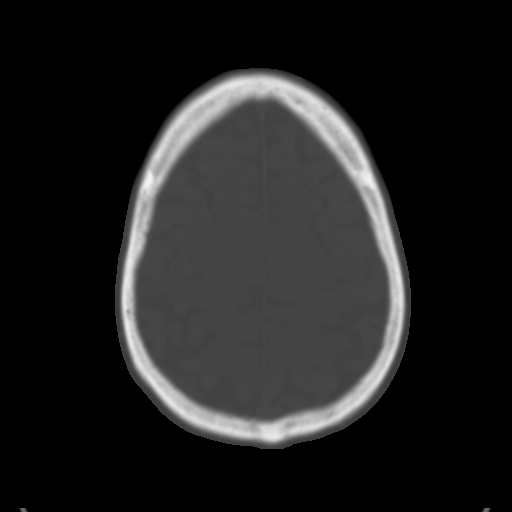
[im 29/34  brain]
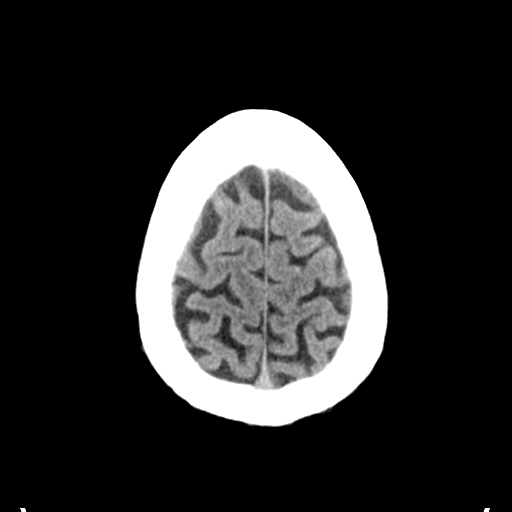
[im 31/34  brain]
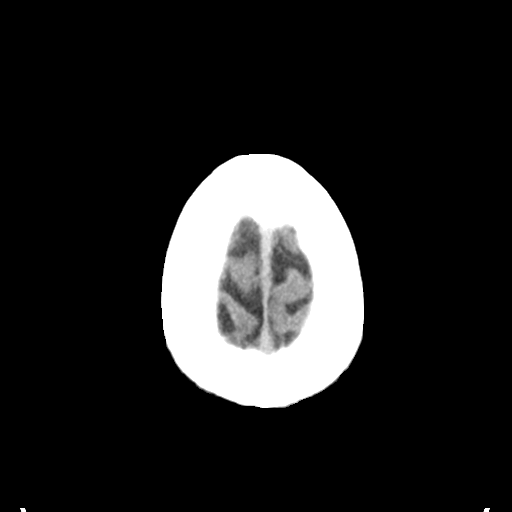

[Series 5: head 3.0 mpr cor · coronal · 0.32mm/px · 3 of 67 slices shown]
[im 23/67  brain]
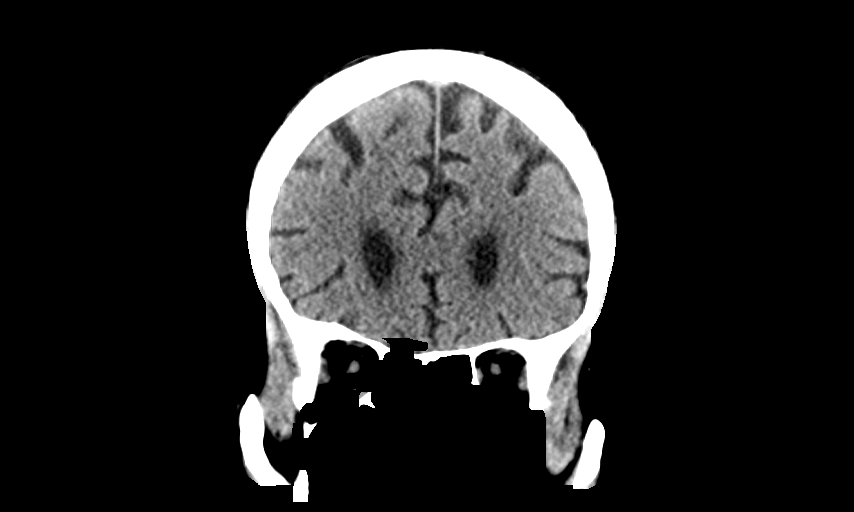
[im 30/67  brain]
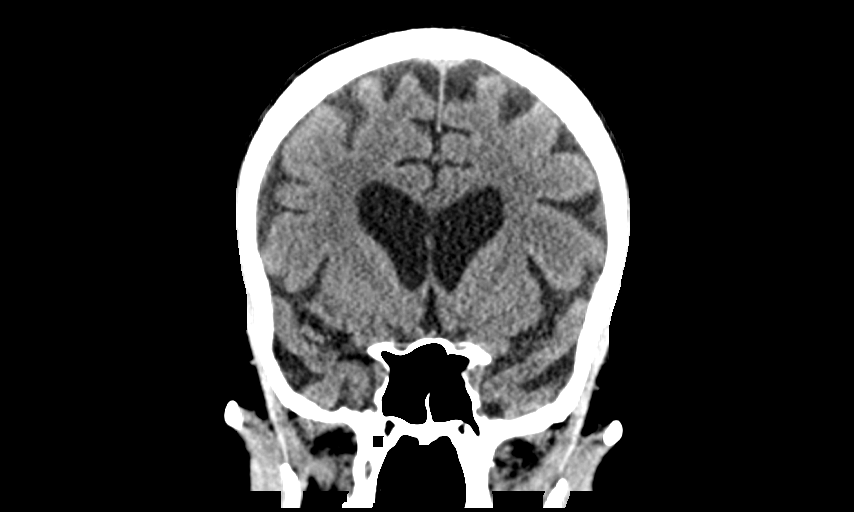
[im 37/67  brain]
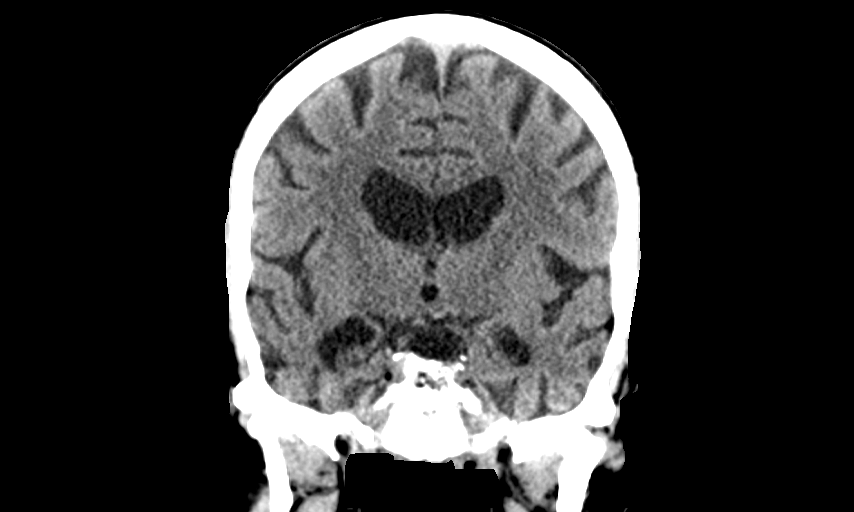

[14 of 47 positions shown; findings below may reference images not displayed]

FINDINGS: CT HEAD FINDINGS

Brain: Mild age advanced cerebral atrophy, ventriculomegaly and
periventricular white matter disease. No extra-axial fluid
collections are identified. No CT findings for acute hemispheric
infarction or intracranial hemorrhage. No mass lesions. The
brainstem and cerebellum are normal.

Vascular: Mild vascular calcifications but no aneurysm or hyperdense
vessels.

Skull: No acute skull fracture or worrisome bone lesions.

Sinuses/Orbits: The paranasal sinuses and mastoid air cells are
clear. The globes are intact.

Other: Small left frontal scalp hematoma but no foreign body or
underlying fracture.

CT CERVICAL SPINE FINDINGS

Alignment: Normal.

Skull base and vertebrae: No acute fracture. No primary bone lesion
or focal pathologic process. The C2-3 facets are fused bilaterally.

Soft tissues and spinal canal: No prevertebral fluid or swelling. No
visible canal hematoma.

Disc levels: The spinal canal is quite generous. No large disc
protrusions, significant spinal or foraminal stenosis. Air-filled
bone cyst noted in the C7 vertebral body.

Upper chest: The visualized lung apices are grossly clear.

Other: No neck mass or adenopathy.
IMPRESSION: 1. Mild age advanced cerebral atrophy, ventriculomegaly and
periventricular white matter disease.
2. No acute intracranial findings or skull fracture.
3. Normal alignment of the cervical vertebral bodies and no acute
cervical spine fracture.

## 2021-10-12 IMAGING — CT CT CERVICAL SPINE W/O CM
3 of 4 series · 12 of 33 positions shown, 14 images · non-contrast
Comparison: Brain MRI 10/15/2010

CLINICAL DATA: Fell.  Hit head.  Altered mental status.

EXAM:
CT HEAD WITHOUT CONTRAST
CT CERVICAL SPINE WITHOUT CONTRAST
TECHNIQUE: Multidetector CT imaging of the head and cervical spine was
performed following the standard protocol without intravenous
contrast. Multiplanar CT image reconstructions of the cervical spine
were also generated.

[Series 4: c_spine 2.0 3 st · axial · 0.28mm/px · z∈[-268,-128]mm · 4 of 100 slices shown, 5 images]
[im 17/100  soft-tissue]
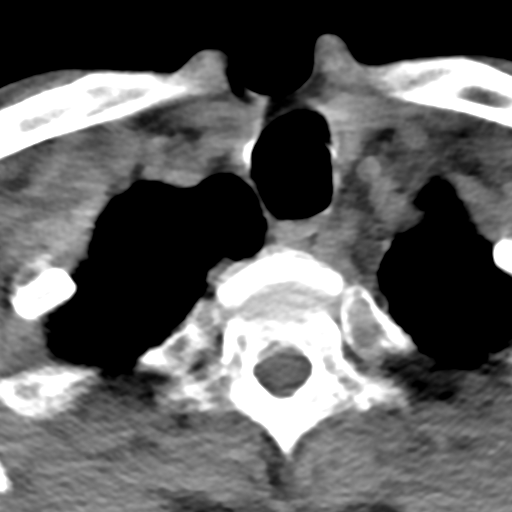
[im 17/100  bone]
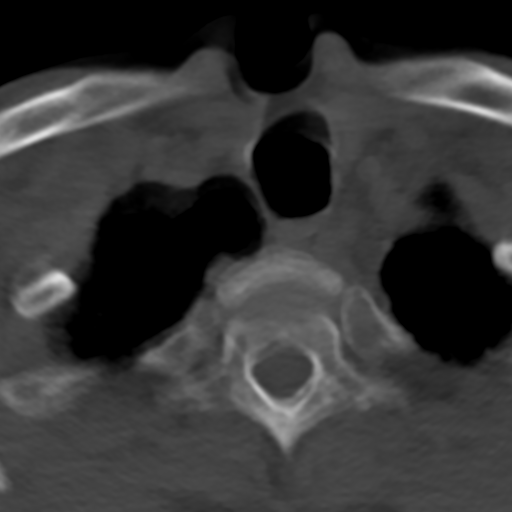
[im 34/100  bone]
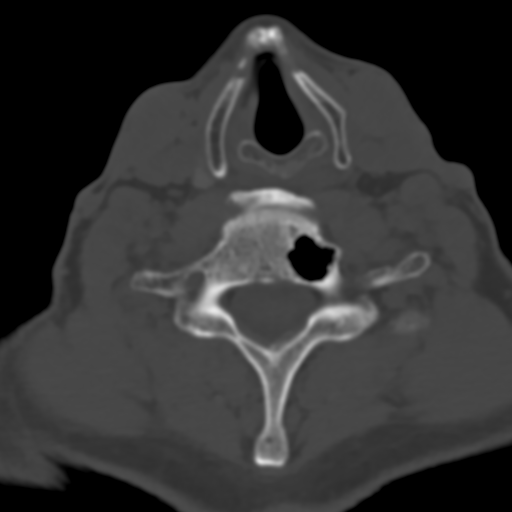
[im 67/100  bone]
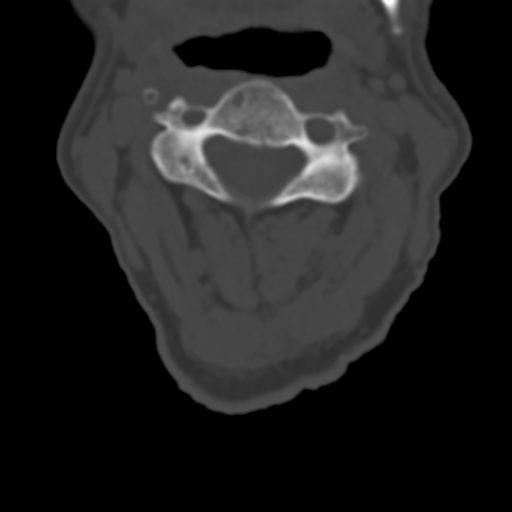
[im 83/100  bone]
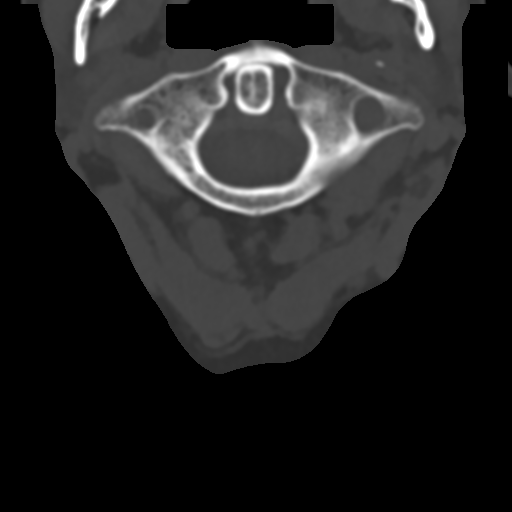

[Series 5: coronal bone · coronal · 0.30mm/px · 3 of 58 slices shown]
[im 12/58  bone]
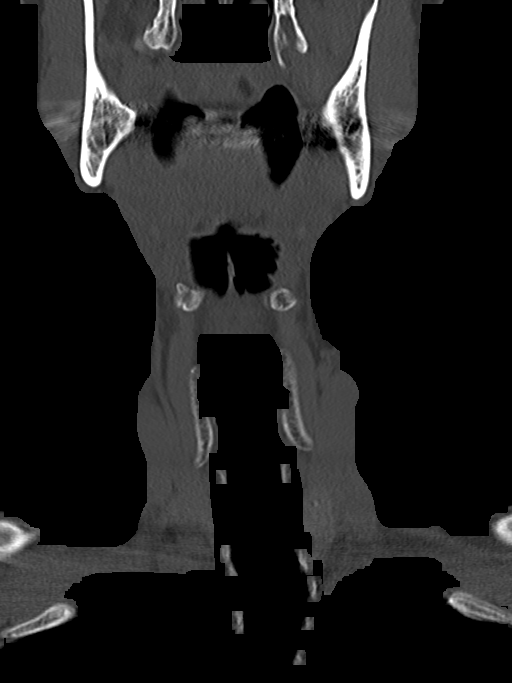
[im 23/58  bone]
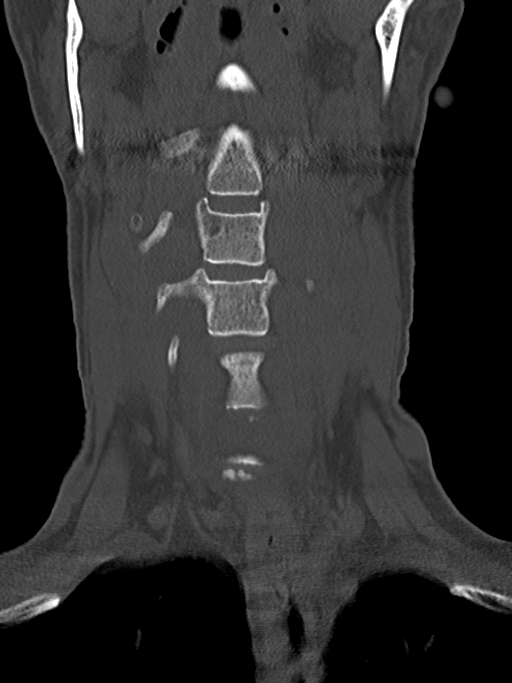
[im 35/58  bone]
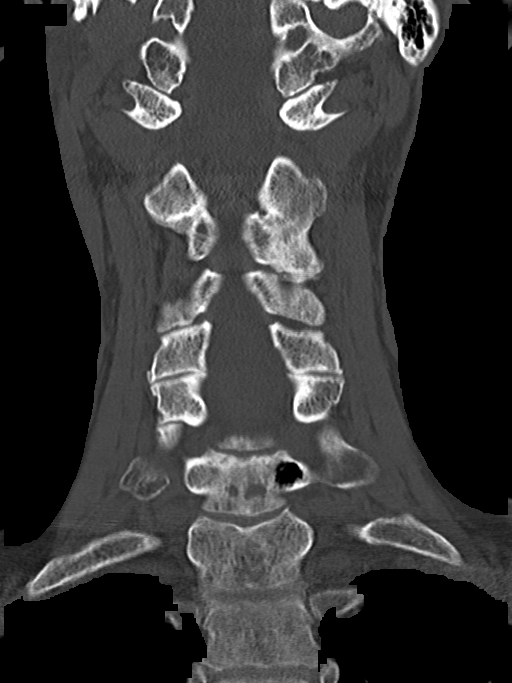

[Series 6: sagittal bone · sagittal · 0.25mm/px · 5 of 61 slices shown, 6 images]
[im 21/61  bone]
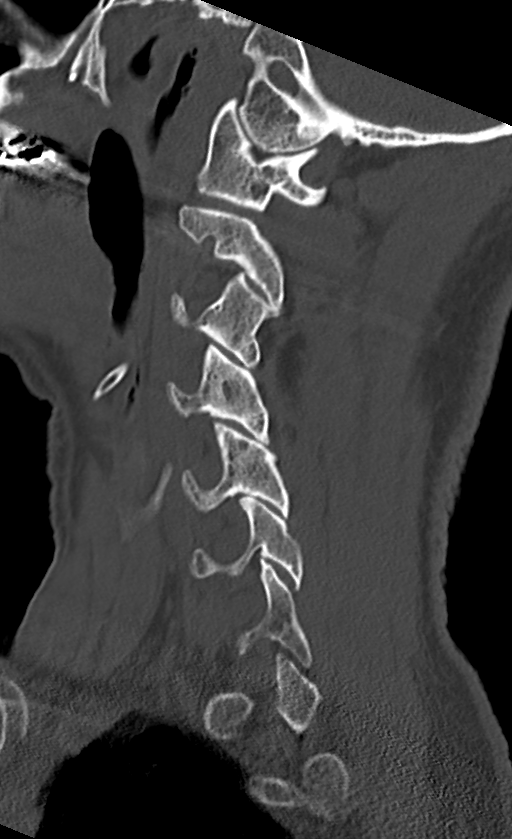
[im 26/61  bone]
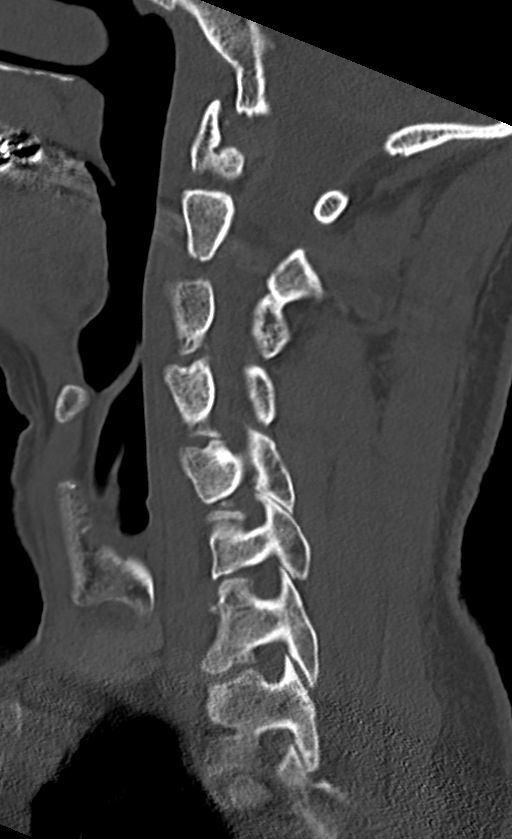
[im 31/61  soft-tissue]
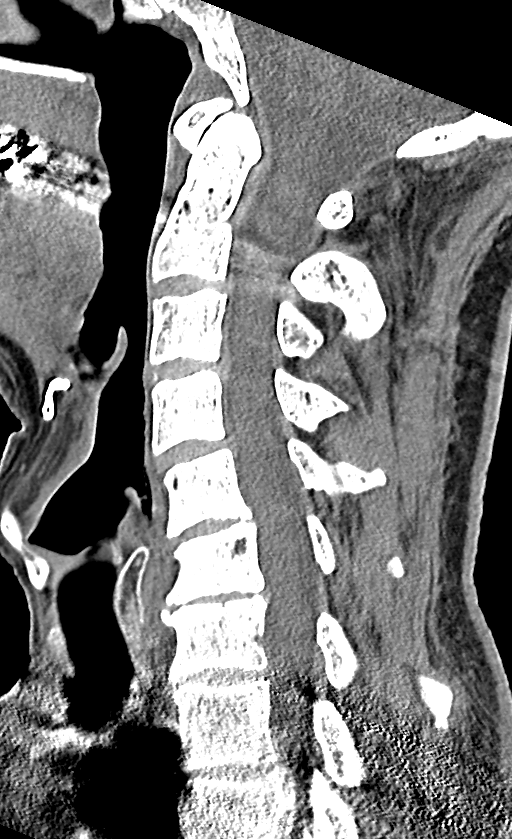
[im 31/61  bone]
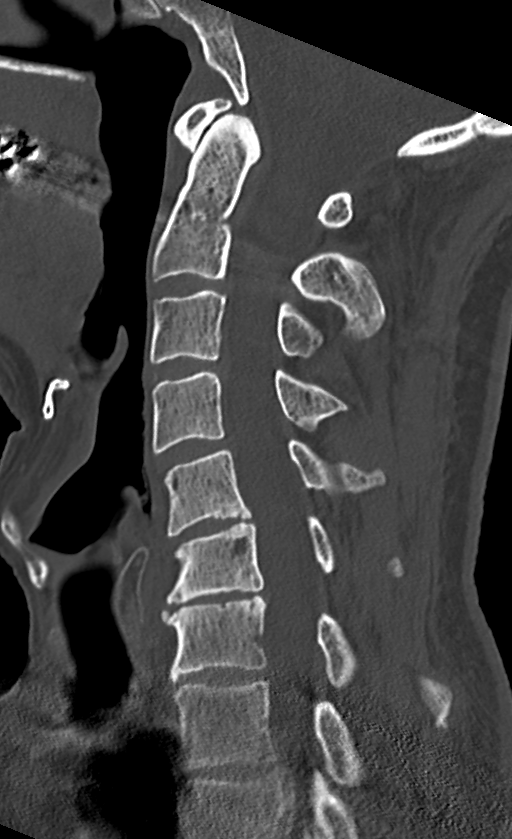
[im 36/61  bone]
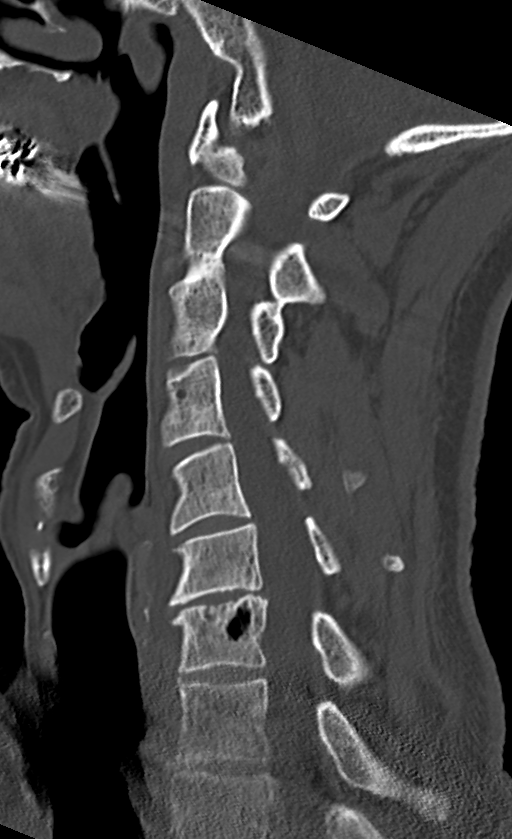
[im 41/61  bone]
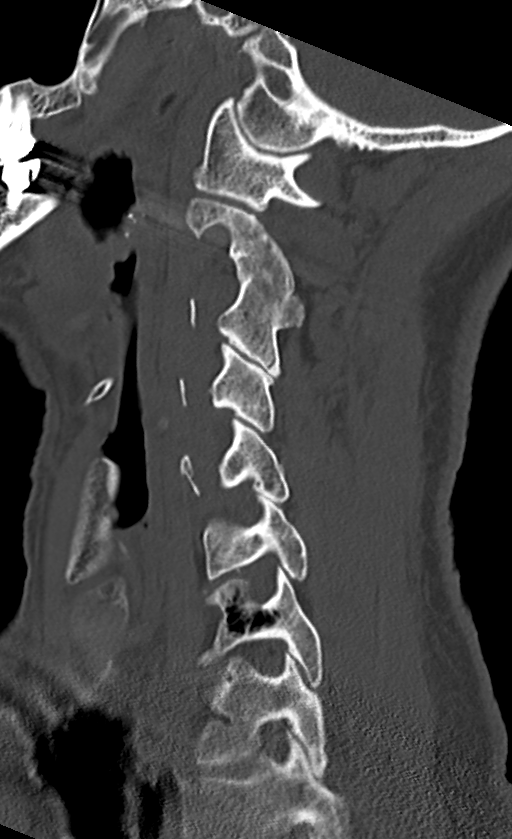

[12 of 33 positions shown; findings below may reference images not displayed]

FINDINGS: CT HEAD FINDINGS

Brain: Mild age advanced cerebral atrophy, ventriculomegaly and
periventricular white matter disease. No extra-axial fluid
collections are identified. No CT findings for acute hemispheric
infarction or intracranial hemorrhage. No mass lesions. The
brainstem and cerebellum are normal.

Vascular: Mild vascular calcifications but no aneurysm or hyperdense
vessels.

Skull: No acute skull fracture or worrisome bone lesions.

Sinuses/Orbits: The paranasal sinuses and mastoid air cells are
clear. The globes are intact.

Other: Small left frontal scalp hematoma but no foreign body or
underlying fracture.

CT CERVICAL SPINE FINDINGS

Alignment: Normal.

Skull base and vertebrae: No acute fracture. No primary bone lesion
or focal pathologic process. The C2-3 facets are fused bilaterally.

Soft tissues and spinal canal: No prevertebral fluid or swelling. No
visible canal hematoma.

Disc levels: The spinal canal is quite generous. No large disc
protrusions, significant spinal or foraminal stenosis. Air-filled
bone cyst noted in the C7 vertebral body.

Upper chest: The visualized lung apices are grossly clear.

Other: No neck mass or adenopathy.
IMPRESSION: 1. Mild age advanced cerebral atrophy, ventriculomegaly and
periventricular white matter disease.
2. No acute intracranial findings or skull fracture.
3. Normal alignment of the cervical vertebral bodies and no acute
cervical spine fracture.

## 2021-12-03 ENCOUNTER — Emergency Department (HOSPITAL_COMMUNITY): Payer: Medicare HMO

## 2021-12-03 ENCOUNTER — Emergency Department (HOSPITAL_COMMUNITY)
Admission: EM | Admit: 2021-12-03 | Discharge: 2021-12-03 | Disposition: A | Discharge status: Home / Self Care | Payer: Medicare HMO | Attending: Emergency Medicine | Admitting: Emergency Medicine

## 2021-12-03 ENCOUNTER — Encounter (HOSPITAL_COMMUNITY): Payer: Self-pay

## 2021-12-03 DIAGNOSIS — R7309 Other abnormal glucose: Secondary | ICD-10-CM | POA: Diagnosis not present

## 2021-12-03 DIAGNOSIS — F0390 Unspecified dementia without behavioral disturbance: Secondary | ICD-10-CM | POA: Insufficient documentation

## 2021-12-03 DIAGNOSIS — Z7982 Long term (current) use of aspirin: Secondary | ICD-10-CM | POA: Diagnosis not present

## 2021-12-03 DIAGNOSIS — R55 Syncope and collapse: Secondary | ICD-10-CM | POA: Diagnosis present

## 2021-12-03 DIAGNOSIS — E860 Dehydration: Secondary | ICD-10-CM | POA: Diagnosis not present

## 2021-12-03 DIAGNOSIS — W19XXXA Unspecified fall, initial encounter: Secondary | ICD-10-CM | POA: Insufficient documentation

## 2021-12-03 LAB — CBC WITH DIFFERENTIAL/PLATELET
Abs Immature Granulocytes: 0.04 10*3/uL (ref 0.00–0.07)
Basophils Absolute: 0.1 10*3/uL (ref 0.0–0.1)
Basophils Relative: 1 %
Eosinophils Absolute: 0.5 10*3/uL (ref 0.0–0.5)
Eosinophils Relative: 6 %
HCT: 42.9 % (ref 39.0–52.0)
Hemoglobin: 14.4 g/dL (ref 13.0–17.0)
Immature Granulocytes: 1 %
Lymphocytes Relative: 20 %
Lymphs Abs: 1.7 10*3/uL (ref 0.7–4.0)
MCH: 33.6 pg (ref 26.0–34.0)
MCHC: 33.6 g/dL (ref 30.0–36.0)
MCV: 100 fL (ref 80.0–100.0)
Monocytes Absolute: 0.7 10*3/uL (ref 0.1–1.0)
Monocytes Relative: 8 %
Neutro Abs: 5.5 10*3/uL (ref 1.7–7.7)
Neutrophils Relative %: 64 %
Platelets: 222 10*3/uL (ref 150–400)
RBC: 4.29 MIL/uL (ref 4.22–5.81)
RDW: 13.1 % (ref 11.5–15.5)
WBC: 8.5 10*3/uL (ref 4.0–10.5)
nRBC: 0 % (ref 0.0–0.2)

## 2021-12-03 LAB — COMPREHENSIVE METABOLIC PANEL
ALT: 13 U/L (ref 0–44)
AST: 17 U/L (ref 15–41)
Albumin: 4.1 g/dL (ref 3.5–5.0)
Alkaline Phosphatase: 99 U/L (ref 38–126)
Anion gap: 11 (ref 5–15)
BUN: 24 mg/dL — ABNORMAL HIGH (ref 8–23)
CO2: 22 mmol/L (ref 22–32)
Calcium: 8.9 mg/dL (ref 8.9–10.3)
Chloride: 107 mmol/L (ref 98–111)
Creatinine, Ser: 1.33 mg/dL — ABNORMAL HIGH (ref 0.61–1.24)
GFR, Estimated: 58 mL/min — ABNORMAL LOW (ref >60)
Glucose, Bld: 116 mg/dL — ABNORMAL HIGH (ref 70–99)
Potassium: 3.9 mmol/L (ref 3.5–5.1)
Sodium: 140 mmol/L (ref 135–145)
Total Bilirubin: 0.4 mg/dL (ref 0.3–1.2)
Total Protein: 7.1 g/dL (ref 6.5–8.1)

## 2021-12-03 LAB — CBG MONITORING, ED: Glucose-Capillary: 111 mg/dL — ABNORMAL HIGH (ref 70–99)

## 2021-12-03 MED ORDER — SODIUM CHLORIDE 0.9 % IV BOLUS
500.0000 mL | Freq: Once | INTRAVENOUS | Status: AC
  Administered 2021-12-03: 500 mL via INTRAVENOUS

## 2021-12-03 MED ORDER — SODIUM CHLORIDE 0.9 % IV SOLN: INTRAVENOUS | Status: DC

## 2021-12-03 NOTE — ED Provider Notes (Signed)
[facility]-EMERGENCY DEPT Provider Note   CSN: [id number] Arrival date & time: 12/03/21  1650     History  Chief Complaint  Patient presents with   Loss of Consciousness    Gerald Diaz is a 66 y.o. male.  Arrived by EMS from [facility].  The facility heard the patient fall in the when they arrived to the room they started CPR on the patient when EMS arrived patient was awake while staff was performing CPR.  On arrival here patient was alert.  Reported baseline mental status his Glasgow Coma Scale was 13-14.  Patient is normally fairly nonverbal.  Patient will make some brief words  Past medical history significant for anemia dimension and kidney stones.       Home Medications Prior to Admission medications   Medication Sig Start Date End Date Taking? Authorizing Provider  acetaminophen (TYLENOL) 500 MG tablet Take 500 mg by mouth every 4 (four) hours as needed (pain or temp > 100.5).    [provider]  aspirin EC 81 MG tablet Take 1 tablet (81 mg total) by mouth daily. Swallow whole. 10/03/21   Rai, Delene Ruffini, MD  diltiazem (CARDIZEM CD) 180 MG 24 hr capsule Take 1 capsule (180 mg total) by mouth daily. 10/03/21   Rai, Delene Ruffini, MD  Emollient (EUCERIN) lotion Apply 1 application. topically daily.    [provider]  fexofenadine (ALLEGRA) 180 MG tablet Take 180 mg by mouth daily.    [provider]  guaiFENesin-dextromethorphan (ROBITUSSIN DM) 100-10 MG/5ML syrup Take 10 mLs by mouth every 4 (four) hours as needed for cough (congestion).    [provider]  ondansetron (ZOFRAN) 4 MG tablet Take 4 mg by mouth every 8 (eight) hours as needed for nausea or vomiting.    [provider]  risperiDONE (RISPERDAL) 0.5 MG tablet Take 0.5 mg by mouth 2 (two) times daily. 0800, 1400    [provider]  sertraline (ZOLOFT) 25 MG tablet Take 25 mg by mouth daily. 01/01/20   [provider]  Tea Tree Oil OIL  Apply 1 application. topically daily as needed (hairline scailness). After washing hair    [provider]  traZODone (DESYREL) 50 MG tablet Take 50 mg by mouth at bedtime.    [provider]      Allergies    Patient has no known allergies.    Review of Systems   Review of Systems  Unable to perform ROS: Dementia    Physical Exam Updated Vital Signs BP 123/71   Pulse 79   Temp 97.6 F (36.4 C) (Oral)   Resp 11   SpO2 96%  Physical Exam Vitals and nursing note reviewed.  Constitutional:      General: He is not in acute distress.    Appearance: Normal appearance. He is well-developed.  HENT:     Head: Normocephalic and atraumatic.     Comments: No evidence of any head trauma    Mouth/Throat:     Mouth: Mucous membranes are dry.  Eyes:     Extraocular Movements: Extraocular movements intact.     Conjunctiva/sclera: Conjunctivae normal.     Pupils: Pupils are equal, round, and reactive to light.  Cardiovascular:     Rate and Rhythm: Normal rate and regular rhythm.     Heart sounds: No murmur heard. Pulmonary:     Effort: Pulmonary effort is normal. No respiratory distress.     Breath sounds: Normal breath sounds.  No wheezing or rales.     Comments: Despite the history of CPR no marks on the anterior part of the chest. Chest:     Chest wall: No tenderness.  Abdominal:     Palpations: Abdomen is soft.     Tenderness: There is no abdominal tenderness.  Musculoskeletal:        General: No swelling.     Cervical back: Neck supple.  Skin:    General: Skin is warm and dry.     Capillary Refill: Capillary refill takes less than 2 seconds.  Neurological:     Mental Status: He is alert. Mental status is at baseline.  Psychiatric:        Mood and Affect: Mood normal.     ED Results / Procedures / Treatments   Labs (all labs ordered are listed, but only abnormal results are displayed) Labs Reviewed  CBG MONITORING, ED - Abnormal; Notable for the  following components:      Result Value   Glucose-Capillary 111 (*)    All other components within normal limits  CBC WITH DIFFERENTIAL/PLATELET  COMPREHENSIVE METABOLIC PANEL    EKG EKG Interpretation  Date/Time:  Wednesday December 03 2021 17:29:12 EDT Ventricular Rate:  80 PR Interval:  151 QRS Duration: 95 QT Interval:  390 QTC Calculation: 450 R Axis:   77 Text Interpretation: Sinus rhythm Confirmed by Vanetta Mulders [phone]) on 12/03/2021 5:48:34 PM  Radiology No results found.  Procedures Procedures    Medications Ordered in ED Medications - No data to display  ED Course/ Medical Decision Making/ A&P                           Medical Decision Making Amount and/or Complexity of Data Reviewed Labs: ordered. Radiology: ordered.  Risk Prescription drug management.   Patient without any traumatic injuries.  No marks on his chest.  Patient's been very stable here.  He is to be a baseline for his dementia.  Work-up CT head and neck without any acute findings.  Patient also had x-rays of both hips and pelvis no bony abnormalities.  Patient CBC normal.  Complete metabolic panel BUN 24 creatinine 1.33 for GFR 58 normally his GFR is greater than 60.  The patient does appear to have some dehydration.  Mucous membranes were dry as well.  Blood sugars have been fine.  Patient stable for discharge back to nursing facility.  Hemodynamically and cardiac monitoring here without any acute findings or any abnormalities no arrhythmias.   Final Clinical Impression(s) / ED Diagnoses Final diagnoses:  None    Rx / DC Orders ED Discharge Orders     None         Vanetta Mulders, MD 12/03/21 2152

## 2021-12-03 NOTE — ED Notes (Signed)
PT. Back from CT

## 2021-12-03 NOTE — ED Notes (Signed)
Report called to Fayrene Fearing at [facility] [place] to notify of pt. Arrival.

## 2021-12-03 NOTE — Discharge Instructions (Signed)
Work-up for the fall negative CT head and neck.  Also x-rays of pelvis and both hips without any acute injuries.  Labs are consistent with a little bit of dehydration patient received some IV fluids here.  Patient has been stable here from cardiac monitoring standpoint.  Patient stable for return to nursing facility.

## 2021-12-03 NOTE — ED Triage Notes (Signed)
Pt arrives via EMS from [facility]. Facility heard the patient fall and they arrived to his room and started CPR once they entered his room. EMS states patient was awake while staff was performing cpr. Pt arrives alert and oriented to his reported baseline gcs of 13-14. Pt has dementia and is nonverbal.

## 2022-03-05 ENCOUNTER — Other Ambulatory Visit: Payer: Self-pay

## 2022-03-05 ENCOUNTER — Observation Stay (HOSPITAL_COMMUNITY): Payer: Medicare HMO

## 2022-03-05 ENCOUNTER — Observation Stay (HOSPITAL_COMMUNITY)
Admission: EM | Admit: 2022-03-05 | Discharge: 2022-03-06 | Disposition: A | Discharge status: Home / Self Care | Payer: Medicare HMO | Attending: Internal Medicine | Admitting: Internal Medicine

## 2022-03-05 ENCOUNTER — Encounter (HOSPITAL_COMMUNITY): Payer: Self-pay | Admitting: Internal Medicine

## 2022-03-05 ENCOUNTER — Emergency Department (HOSPITAL_COMMUNITY): Payer: Medicare HMO

## 2022-03-05 DIAGNOSIS — Z87891 Personal history of nicotine dependence: Secondary | ICD-10-CM | POA: Insufficient documentation

## 2022-03-05 DIAGNOSIS — R569 Unspecified convulsions: Secondary | ICD-10-CM

## 2022-03-05 DIAGNOSIS — R8290 Unspecified abnormal findings in urine: Secondary | ICD-10-CM | POA: Diagnosis present

## 2022-03-05 DIAGNOSIS — D7589 Other specified diseases of blood and blood-forming organs: Secondary | ICD-10-CM | POA: Diagnosis not present

## 2022-03-05 DIAGNOSIS — R7303 Prediabetes: Secondary | ICD-10-CM | POA: Diagnosis not present

## 2022-03-05 DIAGNOSIS — I480 Paroxysmal atrial fibrillation: Secondary | ICD-10-CM | POA: Diagnosis present

## 2022-03-05 DIAGNOSIS — F0390 Unspecified dementia without behavioral disturbance: Secondary | ICD-10-CM | POA: Diagnosis present

## 2022-03-05 DIAGNOSIS — R944 Abnormal results of kidney function studies: Secondary | ICD-10-CM | POA: Diagnosis not present

## 2022-03-05 DIAGNOSIS — G40909 Epilepsy, unspecified, not intractable, without status epilepticus: Principal | ICD-10-CM | POA: Insufficient documentation

## 2022-03-05 DIAGNOSIS — Z79899 Other long term (current) drug therapy: Secondary | ICD-10-CM | POA: Insufficient documentation

## 2022-03-05 DIAGNOSIS — R7989 Other specified abnormal findings of blood chemistry: Secondary | ICD-10-CM | POA: Diagnosis present

## 2022-03-05 DIAGNOSIS — Z7982 Long term (current) use of aspirin: Secondary | ICD-10-CM | POA: Diagnosis not present

## 2022-03-05 HISTORY — DX: Hydronephrosis with renal and ureteral calculous obstruction: N13.2

## 2022-03-05 HISTORY — DX: Unspecified convulsions: R56.9

## 2022-03-05 LAB — URINALYSIS, ROUTINE W REFLEX MICROSCOPIC
Bacteria, UA: NONE SEEN
Bilirubin Urine: NEGATIVE
Glucose, UA: NEGATIVE mg/dL
Hgb urine dipstick: NEGATIVE
Ketones, ur: NEGATIVE mg/dL
Nitrite: POSITIVE — AB
Protein, ur: 30 mg/dL — AB
Specific Gravity, Urine: 1.015 (ref 1.005–1.030)
pH: 6 (ref 5.0–8.0)

## 2022-03-05 LAB — CBC WITH DIFFERENTIAL/PLATELET
Abs Immature Granulocytes: 0.04 10*3/uL (ref 0.00–0.07)
Basophils Absolute: 0 10*3/uL (ref 0.0–0.1)
Basophils Relative: 0 %
Eosinophils Absolute: 0.3 10*3/uL (ref 0.0–0.5)
Eosinophils Relative: 3 %
HCT: 40.2 % (ref 39.0–52.0)
Hemoglobin: 13.2 g/dL (ref 13.0–17.0)
Immature Granulocytes: 0 %
Lymphocytes Relative: 11 %
Lymphs Abs: 1 10*3/uL (ref 0.7–4.0)
MCH: 33.2 pg (ref 26.0–34.0)
MCHC: 32.8 g/dL (ref 30.0–36.0)
MCV: 101 fL — ABNORMAL HIGH (ref 80.0–100.0)
Monocytes Absolute: 0.6 10*3/uL (ref 0.1–1.0)
Monocytes Relative: 6 %
Neutro Abs: 7.8 10*3/uL — ABNORMAL HIGH (ref 1.7–7.7)
Neutrophils Relative %: 80 %
Platelets: 225 10*3/uL (ref 150–400)
RBC: 3.98 MIL/uL — ABNORMAL LOW (ref 4.22–5.81)
RDW: 12.4 % (ref 11.5–15.5)
WBC: 9.8 10*3/uL (ref 4.0–10.5)
nRBC: 0 % (ref 0.0–0.2)

## 2022-03-05 LAB — COMPREHENSIVE METABOLIC PANEL
ALT: 13 U/L (ref 0–44)
AST: 18 U/L (ref 15–41)
Albumin: 3.6 g/dL (ref 3.5–5.0)
Alkaline Phosphatase: 92 U/L (ref 38–126)
Anion gap: 8 (ref 5–15)
BUN: 20 mg/dL (ref 8–23)
CO2: 24 mmol/L (ref 22–32)
Calcium: 8.8 mg/dL — ABNORMAL LOW (ref 8.9–10.3)
Chloride: 109 mmol/L (ref 98–111)
Creatinine, Ser: 1.27 mg/dL — ABNORMAL HIGH (ref 0.61–1.24)
GFR, Estimated: >60 mL/min (ref >60)
Glucose, Bld: 124 mg/dL — ABNORMAL HIGH (ref 70–99)
Potassium: 4 mmol/L (ref 3.5–5.1)
Sodium: 141 mmol/L (ref 135–145)
Total Bilirubin: 0.3 mg/dL (ref 0.3–1.2)
Total Protein: 6.5 g/dL (ref 6.5–8.1)

## 2022-03-05 LAB — HEMOGLOBIN A1C
Hgb A1c MFr Bld: 5.6 % (ref 4.8–5.6)
Mean Plasma Glucose: 114.02 mg/dL

## 2022-03-05 LAB — MAGNESIUM: Magnesium: 2.3 mg/dL (ref 1.7–2.4)

## 2022-03-05 LAB — GLUCOSE, CAPILLARY: Glucose-Capillary: 94 mg/dL (ref 70–99)

## 2022-03-05 LAB — CBG MONITORING, ED: Glucose-Capillary: 151 mg/dL — ABNORMAL HIGH (ref 70–99)

## 2022-03-05 MED ORDER — LORAZEPAM 2 MG/ML IJ SOLN: 2.0000 mg | INTRAMUSCULAR | Status: DC | PRN

## 2022-03-05 MED ORDER — ACETAMINOPHEN 325 MG PO TABS: 650.0000 mg | ORAL_TABLET | Freq: Four times a day (QID) | ORAL | Status: DC | PRN

## 2022-03-05 MED ORDER — DILTIAZEM HCL ER COATED BEADS 180 MG PO CP24
180.0000 mg | ORAL_CAPSULE | Freq: Every day | ORAL | Status: DC
  Administered 2022-03-06: 180 mg via ORAL
  Filled 2022-03-05: qty 1

## 2022-03-05 MED ORDER — LEVETIRACETAM IN NACL 500 MG/100ML IV SOLN
500.0000 mg | Freq: Two times a day (BID) | INTRAVENOUS | Status: DC
  Administered 2022-03-06 (×2): 500 mg via INTRAVENOUS
  Filled 2022-03-05 (×3): qty 100

## 2022-03-05 MED ORDER — ENOXAPARIN SODIUM 40 MG/0.4ML IJ SOSY
40.0000 mg | PREFILLED_SYRINGE | INTRAMUSCULAR | Status: DC
  Administered 2022-03-05 – 2022-03-06 (×2): 40 mg via SUBCUTANEOUS
  Filled 2022-03-05 (×2): qty 0.4

## 2022-03-05 MED ORDER — RISPERIDONE 0.25 MG PO TABS
0.5000 mg | ORAL_TABLET | Freq: Two times a day (BID) | ORAL | Status: DC
  Administered 2022-03-06 (×2): 0.5 mg via ORAL
  Filled 2022-03-05 (×2): qty 2

## 2022-03-05 MED ORDER — LEVETIRACETAM IN NACL 1000 MG/100ML IV SOLN
1000.0000 mg | Freq: Once | INTRAVENOUS | Status: AC
  Administered 2022-03-05: 1000 mg via INTRAVENOUS
  Filled 2022-03-05: qty 100

## 2022-03-05 MED ORDER — SERTRALINE HCL 25 MG PO TABS
25.0000 mg | ORAL_TABLET | Freq: Every day | ORAL | Status: DC
  Administered 2022-03-06: 25 mg via ORAL
  Filled 2022-03-05: qty 1

## 2022-03-05 MED ORDER — LACTATED RINGERS IV BOLUS
1000.0000 mL | Freq: Once | INTRAVENOUS | Status: AC
  Administered 2022-03-05: 1000 mL via INTRAVENOUS

## 2022-03-05 MED ORDER — ONDANSETRON HCL 4 MG PO TABS: 4.0000 mg | ORAL_TABLET | Freq: Four times a day (QID) | ORAL | Status: DC | PRN

## 2022-03-05 MED ORDER — TRAZODONE HCL 50 MG PO TABS
50.0000 mg | ORAL_TABLET | Freq: Every day | ORAL | Status: DC
  Administered 2022-03-05 – 2022-03-06 (×2): 50 mg via ORAL
  Filled 2022-03-05 (×2): qty 1

## 2022-03-05 MED ORDER — ACETAMINOPHEN 650 MG RE SUPP: 650.0000 mg | Freq: Four times a day (QID) | RECTAL | Status: DC | PRN

## 2022-03-05 MED ORDER — ONDANSETRON HCL 4 MG/2ML IJ SOLN: 4.0000 mg | Freq: Four times a day (QID) | INTRAMUSCULAR | Status: DC | PRN

## 2022-03-05 MED ORDER — LORAZEPAM 2 MG/ML IJ SOLN
1.0000 mg | Freq: Once | INTRAMUSCULAR | Status: AC
  Administered 2022-03-05: 1 mg via INTRAVENOUS
  Filled 2022-03-05: qty 1

## 2022-03-05 MED ORDER — ASPIRIN 81 MG PO TBEC
81.0000 mg | DELAYED_RELEASE_TABLET | Freq: Every day | ORAL | Status: DC
  Administered 2022-03-06: 81 mg via ORAL
  Filled 2022-03-05: qty 1

## 2022-03-05 NOTE — ED Triage Notes (Signed)
Pt BIBA from [facility]- dementia unit. Pt had witnessed seizure lasting 90 sec. No fall, no trauma, no blood thinners.  Hx of dementia- per EMS pt returned to baseline following short post-ictal period.  Alert- but can not follow commands  20 R hand  HR: 90 BP: 125/84 SPO2: 96 RA CBG: 172

## 2022-03-05 NOTE — Progress Notes (Signed)
Unable to obtain EEG at this moment pt currently in MRI will attempt tomorrow.

## 2022-03-05 NOTE — Consult Note (Addendum)
Neurology Consultation  Reason for Consult: New onset seizure  Referring Physician: Dr. Robb Matar  CC: None  History is obtained from: Chart   HPI: Gerald Diaz is a 66 y.o. male with a history of advanced dementia, atrial fibrillation not on anticoagulation and right nephrectomy who presents from his SNF with a seizure.  Patient had a seizure in the breakfast room lasting approximately 90 seconds and was brought to the ED, where he had a second seizure which consisted of bilateral arm flexion and jerking with eyes rolling back in the head.  Patient is nonverbal and unable to give any history, unclear if this is his baseline.  He did have a possible seizure vs. syncopal event at the SNF several months ago.  ROS:  Unable to obtain due to altered mental status.   Past Medical History:  Diagnosis Date   Anemia    Dementia (HCC)    Nephrolithiasis    Paroxysmal atrial fibrillation with RVR (HCC) 09/23/2021   S/p nephrectomy 02/06/2020   Ureteral stone with hydronephrosis     Family History  Family history unknown: Yes    Social History:   reports that he has quit smoking. He has never used smokeless tobacco. He reports that he does not drink alcohol and does not use drugs.  Medications  Current Facility-Administered Medications:    acetaminophen (TYLENOL) tablet 650 mg, 650 mg, Oral, Q6H PRN **OR** acetaminophen (TYLENOL) suppository 650 mg, 650 mg, Rectal, Q6H PRN, Bobette Mo, MD   enoxaparin (LOVENOX) injection 40 mg, 40 mg, Subcutaneous, Q24H, Bobette Mo, MD   ondansetron [facility]) tablet 4 mg, 4 mg, Oral, Q6H PRN **OR** ondansetron (ZOFRAN) injection 4 mg, 4 mg, Intravenous, Q6H PRN, Bobette Mo, MD  Current Outpatient Medications:    acetaminophen (TYLENOL) 500 MG tablet, Take 500 mg by mouth every 4 (four) hours as needed (pain or temp > 100.5)., Disp: , Rfl:    aspirin EC 81 MG tablet, Take 1 tablet (81 mg total) by mouth daily. Swallow whole., Disp: 30  tablet, Rfl: 12   diltiazem (CARDIZEM CD) 180 MG 24 hr capsule, Take 1 capsule (180 mg total) by mouth daily., Disp: , Rfl:    guaiFENesin-dextromethorphan (ROBITUSSIN DM) 100-10 MG/5ML syrup, Take 10 mLs by mouth every 4 (four) hours as needed for cough (congestion)., Disp: , Rfl:    ondansetron (ZOFRAN) 4 MG tablet, Take 4 mg by mouth every 8 (eight) hours as needed for nausea or vomiting., Disp: , Rfl:    risperiDONE (RISPERDAL) 0.5 MG tablet, Take 0.5 mg by mouth 2 (two) times daily. 0800, 1400, Disp: , Rfl:    sertraline (ZOLOFT) 25 MG tablet, Take 25 mg by mouth daily., Disp: , Rfl:    Tea Tree Oil OIL, Apply 1 application  topically daily as needed (hairline scailness)., Disp: , Rfl:    traZODone (DESYREL) 50 MG tablet, Take 50 mg by mouth at bedtime., Disp: , Rfl:    Emollient (EUCERIN) lotion, Apply 1 application. topically daily. (Patient not taking: Reported on 03/05/2022), Disp: , Rfl:    fexofenadine (ALLEGRA) 180 MG tablet, Take 180 mg by mouth daily. (Patient not taking: Reported on 03/05/2022), Disp: , Rfl:    Exam: Current vital signs: BP (!) 141/114 (BP Location: Right Arm)   Pulse (!) 106   Temp (!) 97.4 F (36.3 C) (Oral)   Resp 11   Ht 6' (1.829 m)   Wt 81.6 kg   SpO2 97%   BMI 24.41  kg/m  Vital signs in last 24 hours: Temp:  [97.4 F (36.3 C)] 97.4 F (36.3 C) (11/02 1007) Pulse Rate:  [89-106] 106 (11/02 1130) Resp:  [11-16] 11 (11/02 1130) BP: (111-141)/(68-114) 141/114 (11/02 1130) SpO2:  [96 %-97 %] 97 % (11/02 1130) Weight:  [81.6 kg] 81.6 kg (11/02 1008)  GENERAL: Drowsy, eyes closed, in no acute distress Psych: Affect flat Head: Normocephalic and atraumatic, without obvious abnormality. Increased tone of neck extensors.  EENT: Normal conjunctivae, moist mucous membranes, no OP obstruction LUNGS: Normal respiratory effort. Non-labored breathing on room air CV: Regular rate and rhythm on telemetry Extremities: warm, well perfused, without obvious  deformity  NEURO:  Mental Status:  Initial NP exam: Drowsy and nonverbal, does not open eyes to voice or touch No neglect is noted Follow up Neurology attending exam: Awake and alert. Accepts food spoon fed to him with normal chewing and swallowing. No agitation. Only verbal output is "no". Does not follow any commands.  Cranial Nerves:  II: PERRL III, IV, VI: Will not track objects on initial NP exam. Briefly gazes at examiner's face on follow up attending exam.  VII: Face is symmetric resting and while chewing food. Hypomimia noted.  Motor: Moves BUE purposefully and with good antigravity strength. Will localize to sternal rub.   Muscle bulk is normal. Mild cogwheel rigidity of BUE, R > L. Mild rigidity of BLE.   Sensation: Reacts to touch x 4 DTRs: Brisk low amplitude 3+ brachioradialis and patellar reflexes bilaterally Gait: Deferred  Labs I have reviewed labs in epic and the results pertinent to this consultation are:   CBC    Component Value Date/Time   WBC 9.8 03/05/2022 1045   RBC 3.98 (L) 03/05/2022 1045   HGB 13.2 03/05/2022 1045   HGB 14.1 04/23/2009 1504   HCT 40.2 03/05/2022 1045   HCT 41.7 04/23/2009 1504   PLT 225 03/05/2022 1045   PLT 193 04/23/2009 1504   MCV 101.0 (H) 03/05/2022 1045   MCV 99 (H) 04/23/2009 1504   MCH 33.2 03/05/2022 1045   MCHC 32.8 03/05/2022 1045   RDW 12.4 03/05/2022 1045   RDW 11.5 04/23/2009 1504   LYMPHSABS 1.0 03/05/2022 1045   LYMPHSABS 1.9 04/23/2009 1504   MONOABS 0.6 03/05/2022 1045   EOSABS 0.3 03/05/2022 1045   EOSABS 0.6 (H) 04/23/2009 1504   BASOSABS 0.0 03/05/2022 1045   BASOSABS 0.1 04/23/2009 1504    CMP     Component Value Date/Time   NA 141 03/05/2022 1045   NA 144 03/13/2009 1325   K 4.0 03/05/2022 1045   K 4.6 03/13/2009 1325   CL 109 03/05/2022 1045   CL 105 03/13/2009 1325   CO2 24 03/05/2022 1045   CO2 29 03/13/2009 1325   GLUCOSE 124 (H) 03/05/2022 1045   GLUCOSE 86 03/13/2009 1325   BUN 20  03/05/2022 1045   BUN 27 (H) 03/13/2009 1325   CREATININE 1.27 (H) 03/05/2022 1045   CREATININE 0.8 03/13/2009 1325   CALCIUM 8.8 (L) 03/05/2022 1045   CALCIUM 9.1 03/13/2009 1325   PROT 6.5 03/05/2022 1045   PROT 6.9 03/13/2009 1325   ALBUMIN 3.6 03/05/2022 1045   ALBUMIN 3.5 03/13/2009 1325   AST 18 03/05/2022 1045   AST 27 03/13/2009 1325   ALT 13 03/05/2022 1045   ALT 27 03/13/2009 1325   ALKPHOS 92 03/05/2022 1045   ALKPHOS 66 03/13/2009 1325   BILITOT 0.3 03/05/2022 1045   BILITOT 0.60 03/13/2009 1325  GFRNONAA >60 03/05/2022 1045   GFRAA 47 (L) 01/08/2020 0505    Lipid Panel  No results found for: "CHOL", "TRIG", "HDL", "CHOLHDL", "VLDL", "LDLCALC", "LDLDIRECT"   Imaging I have reviewed the images obtained:  CT-scan of the brain: No acute abnormality.  Atrophy and white matter disease  MRI examination of the brain: pending  Assessment: 65 year old patient with history of dementia, right nephrectomy and atrial fibrillation not on anticoagulation presents from SNF with new onset seizures.  He also had a questionable seizure vs. syncopal event several months ago. A second seizure today consisting of upper extremity flexion and jerking with eyes rolling back in the head was witnessed in the ED and patient was given lorazepam and Keppra.   - On initial NP exam he is drowsy, nonverbal and unable to follow commands. On follow up attending exam at 9::15 PM, he is awake and alert, eating food spoon fed to him by Nurse Tech, localizes to sternal rub and is nonverbal except for saying "no" to 3 questions. Unsure what his baseline is.   - Will need MRI brain and EEG as well as admission for seizure workup. 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   Pt seen by NP/Neuro and later by MD.   Katy Apo , MSN, AGACNP-BC [facility] See Amion for schedule and pager information 03/05/2022 12:59 PM  I have seen and examined the patient. I have formulated the assessment and recommendations. 66 year old patient with history of dementia, right nephrectomy and atrial fibrillation not on anticoagulation presents from SNF with new onset seizures.  He also had a questionable seizure vs. syncopal event several months ago. A second seizure today consisting of upper extremity flexion and jerking with eyes rolling back in the head was witnessed in the ED and patient was given lorazepam and Keppra. 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 Recommendations as above.  Electronically signed: Dr. Kerney Elbe

## 2022-03-05 NOTE — ED Provider Notes (Addendum)
[facility] DEPT Provider Note   CSN: [id number] Arrival date & time: 03/05/22  0948     History  Chief Complaint  Patient presents with   Seizures    HARGIS Gerald Diaz is a 66 y.o. male.  HPI 66 year old male presents with a seizure from the nursing facility.  History is by EMS.  He was eating breakfast and had a 90 second witnessed seizure by staff.  Initially not talking to EMS but then now is starting to be more alert.  He has dementia and is unclear to the paramedic what his typical baseline is.  The nursing staff reported to her that he has had 1 seizure in the past.  I spoke to [facility] staff, they noted that he had seizure at breakfast. Was in his chair and staff laid down to ground. No recent illness. Normally nonverbal but is active and busy.  She notes that last time he did not have a seizure but actually had syncope.  Home Medications Prior to Admission medications   Medication Sig Start Date End Date Taking? Authorizing Provider  acetaminophen (TYLENOL) 500 MG tablet Take 500 mg by mouth every 4 (four) hours as needed (pain or temp > 100.5).   Yes [provider]  aspirin EC 81 MG tablet Take 1 tablet (81 mg total) by mouth daily. Swallow whole. 10/03/21  Yes Rai, Ripudeep K, MD  diltiazem (CARDIZEM CD) 180 MG 24 hr capsule Take 1 capsule (180 mg total) by mouth daily. 10/03/21  Yes Rai, Ripudeep K, MD  guaiFENesin-dextromethorphan (ROBITUSSIN DM) 100-10 MG/5ML syrup Take 10 mLs by mouth every 4 (four) hours as needed for cough (congestion).   Yes [provider]  ondansetron (ZOFRAN) 4 MG tablet Take 4 mg by mouth every 8 (eight) hours as needed for nausea or vomiting.   Yes [provider]  risperiDONE (RISPERDAL) 0.5 MG tablet Take 0.5 mg by mouth 2 (two) times daily. 0800, 1400   Yes [provider]  sertraline (ZOLOFT) 25 MG tablet Take 25 mg by mouth daily. 01/01/20  Yes [provider]  Tea  Tree Oil OIL Apply 1 application  topically daily as needed (hairline scailness).   Yes [provider]  traZODone (DESYREL) 50 MG tablet Take 50 mg by mouth at bedtime.   Yes [provider]  Emollient (EUCERIN) lotion Apply 1 application. topically daily. Patient not taking: Reported on 03/05/2022    [provider]  fexofenadine (ALLEGRA) 180 MG tablet Take 180 mg by mouth daily. Patient not taking: Reported on 03/05/2022    [provider]      Allergies    Patient has no known allergies.    Review of Systems   Review of Systems  Unable to perform ROS: Dementia    Physical Exam Updated Vital Signs BP (!) 141/114 (BP Location: Right Arm)   Pulse (!) 106   Temp (!) 97.4 F (36.3 C) (Oral)   Resp 11   Ht 6' (1.829 m)   Wt 81.6 kg   SpO2 97%   BMI 24.41 kg/m  Physical Exam Vitals and nursing note reviewed.  Constitutional:      General: He is not in acute distress.    Appearance: He is well-developed. He is not ill-appearing or diaphoretic.  HENT:     Head: Normocephalic and atraumatic.  Eyes:     Pupils: Pupils are equal, round, and reactive to light.  Cardiovascular:     Rate and  Rhythm: Normal rate and regular rhythm.     Heart sounds: Normal heart sounds.  Pulmonary:     Effort: Pulmonary effort is normal.     Breath sounds: Normal breath sounds.  Abdominal:     General: There is no distension.  Musculoskeletal:     Cervical back: No rigidity.  Skin:    General: Skin is warm and dry.  Neurological:     Mental Status: He is alert.     Comments: Patient is awake and does look at me when talk to but does not really follow commands.  He will not open his mouth but there is no clear evidence of any type of oral injury.  When I pick up his arms he keeps them in the air and then slowly lowers them.  He will not follow commands for the lower extremity     ED Results / Procedures / Treatments   Labs (all labs ordered are listed,  but only abnormal results are displayed) Labs Reviewed  COMPREHENSIVE METABOLIC PANEL - Abnormal; Notable for the following components:      Result Value   Glucose, Bld 124 (*)    Creatinine, Ser 1.27 (*)    Calcium 8.8 (*)    All other components within normal limits  CBC WITH DIFFERENTIAL/PLATELET - Abnormal; Notable for the following components:   RBC 3.98 (*)    MCV 101.0 (*)    Neutro Abs 7.8 (*)    All other components within normal limits  URINALYSIS, ROUTINE W REFLEX MICROSCOPIC - Abnormal; Notable for the following components:   Protein, ur 30 (*)    Nitrite POSITIVE (*)    Leukocytes,Ua TRACE (*)    All other components within normal limits  CBG MONITORING, ED - Abnormal; Notable for the following components:   Glucose-Capillary 151 (*)    All other components within normal limits  MAGNESIUM    EKG EKG Interpretation  Date/Time:  Thursday March 05 2022 10:30:01 EDT Ventricular Rate:  89 PR Interval:  137 QRS Duration: 89 QT Interval:  366 QTC Calculation: 446 R Axis:   60 Text Interpretation: Sinus rhythm Borderline T wave abnormalities similar to Aug 2023 Confirmed by Sherwood Gambler [phone]) on 03/05/2022 10:43:12 AM  Radiology CT HEAD WO CONTRAST  Result Date: 03/05/2022 CLINICAL DATA:  Seizure. EXAM: CT HEAD WITHOUT CONTRAST TECHNIQUE: Contiguous axial images were obtained from the base of the skull through the vertex without intravenous contrast. RADIATION DOSE REDUCTION: This exam was performed according to the departmental dose-optimization program which includes automated exposure control, adjustment of the mA and/or kV according to patient size and/or use of iterative reconstruction technique. COMPARISON:  12/03/2021 FINDINGS: Brain: Stable age advanced cerebral atrophy, ventriculomegaly and periventricular white matter disease. No extra-axial fluid collections are identified. No CT findings for acute hemispheric infarction or intracranial hemorrhage. No  mass lesions. The brainstem and cerebellum are normal. Vascular: Stable vascular calcifications. No aneurysm or hyperdense vessels. Skull: No skull fracture or bone lesions. Sinuses/Orbits: Ethmoid sinus disease. The other paranasal sinuses are clear. The mastoid air cells and middle ear cavities are clear. The globes are intact. Other: No scalp lesions or scalp hematoma. IMPRESSION: 1. Stable age advanced cerebral atrophy, ventriculomegaly and periventricular white matter disease. 2. No acute intracranial findings or mass lesions. Electronically Signed   By: Marijo Sanes M.D.   On: 03/05/2022 11:26    Procedures Procedures    Medications Ordered in ED Medications  enoxaparin (LOVENOX) injection 40 mg (has no  administration in time range)  acetaminophen (TYLENOL) tablet 650 mg (has no administration in time range)    Or  acetaminophen (TYLENOL) suppository 650 mg (has no administration in time range)  ondansetron (ZOFRAN) tablet 4 mg (has no administration in time range)    Or  ondansetron (ZOFRAN) injection 4 mg (has no administration in time range)  LORazepam (ATIVAN) injection 1 mg (1 mg Intravenous Given 03/05/22 1147)  levETIRAcetam (KEPPRA) IVPB 1000 mg/100 mL premix (0 mg Intravenous Stopped 03/05/22 1221)    ED Course/ Medical Decision Making/ A&P Clinical Course as of 03/05/22 1241  Thu Mar 05, 2022  1140 I was informed by the nurse that the patient had a seizure.  She heard him utter a sound to them when she went in the room he was finishing up a seizure and became tachycardic.  When I arrived in the room he is now postictal though maintaining his airway and oxygenation.  We will give Ativan, IV Keppra, and consult neurology. [SG]    Clinical Course User Index [SG] Sherwood Gambler, MD                           Medical Decision Making Amount and/or Complexity of Data Reviewed Labs: ordered.    Details: Normal WBC. No significant electrolyte disturbance. Radiology: ordered and  independent interpretation performed.    Details: No head bleed on CT head ECG/medicine tests: ordered and independent interpretation performed.    Details: No significant change from 3 months ago  Risk Prescription drug management. Decision regarding hospitalization.   Patient has had 2 seizures today. No prior history, though chart review shows he was admitted in May with syncope vs seizure, thought to be more likely syncope due to new afib and hypotension. Given IV keppra here. Discussed with Dr. Cheral Marker. Neuro will see but he can stay [facility] at [facility] Will need MRI and EEG. Dr. Olevia Bowens to admit.  I also talked to [place], and we discussed presentation, treatment and plan.  No fever, elevated WBC or change in mental status from baseline. I doubt CNS infection at this time.      Final Clinical Impression(s) / ED Diagnoses Final diagnoses:  Seizures [facility])    Rx / DC Orders ED Discharge Orders     None         Sherwood Gambler, MD 03/05/22 1248    Sherwood Gambler, MD 03/05/22 1248

## 2022-03-05 NOTE — H&P (Signed)
History and Physical    Patient: Gerald Diaz XBD:[id number] DOB: November 26, 1955 DOA: 03/05/2022 DOS: the patient was seen and examined on 03/05/2022 PCP: [name], MD  Patient coming from: Home  Chief Complaint:  Chief Complaint  Patient presents with   Seizures   HPI: Gerald Diaz is a 66 y.o. 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  The patient is alert, but does not follow commands.  He is unable to provide further information.  ED course: Initial vital signs were temperature 97.4 F, pulse 89, respirations 16, BP 111/68 mmHg and O2 sat 96% on room air.  The patient received lorazepam 1 mg IVPB and 1000 mg of Keppra IVPB.  I added LR 1000 mL bolus.  Lab work: His urinalysis with proteinuria 30 mg/deciliter, positive nitrites, trace leukocyte esterase, 6-10 WBC and no bacteria on microscopic examination.  CBC showed a white count of 9.8,, hemoglobin 13.2 g/dL and platelets 834.  CMP showed a glucose of 124 and creatinine of 1.27 mg/dL.  The rest of the CMP measurements and magnesium were normal after calcium was corrected.  Imaging: CT head without contrast with stable AHI 1 cerebral atrophy, ventriculomegaly and periventricular white matter disease no acute intracranial findings or mass lesions seen.   Review of Systems: As mentioned in the history of present illness. All other systems reviewed and are negative.  Past Medical History:  Diagnosis Date   Anemia    Dementia (HCC)    Nephrolithiasis    Past Surgical History:  Procedure Laterality Date   CYSTOSCOPY W/ URETERAL STENT PLACEMENT Left 01/05/2020   Procedure: CYSTOSCOPY WITH RETROGRADE PYELOGRAM/URETERAL STENT PLACEMENT;  Surgeon: Crista Elliot, MD;  Location:  [facility];  Service: Urology;  Laterality: Left;   CYSTOSCOPY WITH LITHOLAPAXY N/A 02/09/2020   Procedure: CYSTOSCOPY WITH LITHOLAPAXY;  Surgeon: Sebastian Ache, MD;  Location: [facility] OR;  Service: Urology;  Laterality: N/A;   CYSTOSCOPY WITH RETROGRADE PYELOGRAM, URETEROSCOPY AND STENT PLACEMENT Left 02/09/2020   Procedure: CYSTOSCOPY WITH RETROGRADE PYELOGRAM, URETEROSCOPY AND STENT EXCHANGE;  Surgeon: Sebastian Ache, MD;  Location: [facility] OR;  Service: Urology;  Laterality: Left;  75 MINS   HOLMIUM LASER APPLICATION Left 02/09/2020   Procedure: HOLMIUM LASER APPLICATION;  Surgeon: Sebastian Ache, MD;  Location: [facility] OR;  Service: Urology;  Laterality: Left;   NEPHRECTOMY     pt was 10   Social History:  reports that he has quit smoking. He has never used smokeless tobacco. He reports that he does not drink alcohol and does not use drugs.  No Known Allergies  Family History  Family history unknown: Yes  Unable to obtain family history at this time.  Prior to Admission medications   Medication Sig Start Date End Date Taking? Authorizing Provider  acetaminophen (TYLENOL) 500 MG tablet Take 500 mg by mouth every 4 (four) hours as needed (pain or temp > 100.5).   Yes [provider]  aspirin EC 81 MG tablet Take 1 tablet (81 mg total) by mouth daily. Swallow whole. 10/03/21  Yes Rai, Ripudeep K, MD  diltiazem (CARDIZEM CD) 180 MG 24 hr capsule Take 1 capsule (180 mg total) by mouth daily. 10/03/21  Yes Rai, Ripudeep K, MD  guaiFENesin-dextromethorphan (ROBITUSSIN DM) 100-10 MG/5ML syrup Take 10 mLs by mouth every 4 (four)  hours as needed for cough (congestion).   Yes [provider]  ondansetron (ZOFRAN) 4 MG tablet Take 4 mg by mouth every 8 (eight) hours as needed for nausea or vomiting.   Yes [provider]  risperiDONE (RISPERDAL) 0.5 MG tablet Take 0.5 mg by mouth 2 (two) times daily. 0800, 1400   Yes [provider]  sertraline (ZOLOFT) 25 MG tablet Take 25 mg by  mouth daily. 01/01/20  Yes [provider]  Tea Tree Oil OIL Apply 1 application  topically daily as needed (hairline scailness).   Yes [provider]  traZODone (DESYREL) 50 MG tablet Take 50 mg by mouth at bedtime.   Yes [provider]  Emollient (EUCERIN) lotion Apply 1 application. topically daily. Patient not taking: Reported on 03/05/2022    [provider]  fexofenadine (ALLEGRA) 180 MG tablet Take 180 mg by mouth daily. Patient not taking: Reported on 03/05/2022    [provider]    Physical Exam: Vitals:   03/05/22 1000 03/05/22 1007 03/05/22 1008 03/05/22 1130  BP:  111/68  (!) 141/114  Pulse:  89  (!) 106  Resp:  16  11  Temp:  (!) 97.4 F (36.3 C)    TempSrc:  Oral    SpO2: 96% 96%  97%  Weight:   81.6 kg   Height:   6' (1.829 m)    Physical Exam Vitals and nursing note reviewed.  Constitutional:      General: He is awake. He is not in acute distress.    Appearance: Normal appearance.  HENT:     Head: Normocephalic.     Nose: No rhinorrhea.     Mouth/Throat:     Mouth: Mucous membranes are dry.  Eyes:     General: No scleral icterus.    Pupils: Pupils are equal, round, and reactive to light.  Neck:     Vascular: No JVD.  Cardiovascular:     Rate and Rhythm: Normal rate and regular rhythm.     Heart sounds: S1 normal and S2 normal.  Pulmonary:     Breath sounds: No wheezing, rhonchi or rales.  Abdominal:     General: Bowel sounds are normal. There is no distension.     Palpations: Abdomen is soft.     Tenderness: There is no abdominal tenderness.  Musculoskeletal:     Cervical back: Neck supple.     Right lower leg: No edema.     Left lower leg: No edema.  Skin:    General: Skin is warm and dry.  Neurological:     Mental Status: He is alert. Mental status is at baseline.  Psychiatric:        Attention and Perception: He is inattentive.        Cognition and Memory: Cognition is impaired.   Data  Reviewed:  There are no new results to review at this time.  Assessment and Plan: Principal Problem:   Seizure ([place]) Observation/telemetry. Seizure precautions. Frequent neurochecks. Check electroencephalogram. Check MRI of the brain. Lorazepam IVP for seizure as needed. Continue Keppra IV per neurology recommendations. Neurology consult appreciated.  Active Problems:   Elevated serum creatinine Does not meet criteria for AKI. LR bolus 1000 mL x 1. LR bolus 100 mL/h x 20 hours. Follow-up renal function in AM.    Abnormal urinalysis No fever. No pain on physical exam. No bacteria or significant WBC on UA. We will continue to monitor.    Macrocytosis Has had  it mildly on and off. Unknown clinical significance. Previous B12 levels were normal.    Paroxysmal atrial fibrillation (HCC)  CHA2DS2-VASc Score of at least 3. Continue Cardizem 180 mg p.o. daily. The patient is not on anticoagulation.    Dementia (HCC) Supportive care. Continue risperidone 0.5 mg p.o. twice daily. Continue sertraline 25 mg p.o. daily. Continue trazodone 50 mg p.o. at bedtime.    Prediabetes Carbohydrate modified diet. CBG monitoring before meals and bedtime. Check hemoglobin A1c.    Advance Care Planning:   Code Status: Full Code   Consults: Neuro hospitalist team.  Family Communication:   Severity of Illness: The appropriate patient status for this patient is OBSERVATION. Observation status is judged to be reasonable and necessary in order to provide the required intensity of service to ensure the patient's safety. The patient's presenting symptoms, physical exam findings, and initial radiographic and laboratory data in the context of their medical condition is felt to place them at decreased risk for further clinical deterioration. Furthermore, it is anticipated that the patient will be medically stable for discharge from the hospital within 2 midnights of admission.   Author: Bobette Mo, MD 03/05/2022 12:25 PM  For on call review [email or web address].   This document was prepared using Dragon voice recognition software and may contain some unintended transcription errors.

## 2022-03-05 NOTE — ED Notes (Signed)
Pt not responding appropriate. Will not grip with L hand or move L leg on command

## 2022-03-06 ENCOUNTER — Other Ambulatory Visit: Payer: Self-pay

## 2022-03-06 ENCOUNTER — Observation Stay (HOSPITAL_COMMUNITY)
Admit: 2022-03-06 | Discharge: 2022-03-06 | Disposition: A | Discharge status: Home / Self Care | Payer: Medicare HMO | Attending: Internal Medicine | Admitting: Internal Medicine

## 2022-03-06 DIAGNOSIS — R569 Unspecified convulsions: Secondary | ICD-10-CM | POA: Diagnosis not present

## 2022-03-06 LAB — COMPREHENSIVE METABOLIC PANEL
ALT: 14 U/L (ref 0–44)
AST: 21 U/L (ref 15–41)
Albumin: 3.5 g/dL (ref 3.5–5.0)
Alkaline Phosphatase: 94 U/L (ref 38–126)
Anion gap: 8 (ref 5–15)
BUN: 15 mg/dL (ref 8–23)
CO2: 26 mmol/L (ref 22–32)
Calcium: 8.4 mg/dL — ABNORMAL LOW (ref 8.9–10.3)
Chloride: 107 mmol/L (ref 98–111)
Creatinine, Ser: 1.09 mg/dL (ref 0.61–1.24)
GFR, Estimated: >60 mL/min (ref >60)
Glucose, Bld: 97 mg/dL (ref 70–99)
Potassium: 3.6 mmol/L (ref 3.5–5.1)
Sodium: 141 mmol/L (ref 135–145)
Total Bilirubin: 0.9 mg/dL (ref 0.3–1.2)
Total Protein: 6.2 g/dL — ABNORMAL LOW (ref 6.5–8.1)

## 2022-03-06 LAB — CBC
HCT: 40 % (ref 39.0–52.0)
Hemoglobin: 13.3 g/dL (ref 13.0–17.0)
MCH: 33.3 pg (ref 26.0–34.0)
MCHC: 33.3 g/dL (ref 30.0–36.0)
MCV: 100.3 fL — ABNORMAL HIGH (ref 80.0–100.0)
Platelets: 214 10*3/uL (ref 150–400)
RBC: 3.99 MIL/uL — ABNORMAL LOW (ref 4.22–5.81)
RDW: 12.4 % (ref 11.5–15.5)
WBC: 9.4 10*3/uL (ref 4.0–10.5)
nRBC: 0 % (ref 0.0–0.2)

## 2022-03-06 LAB — GLUCOSE, CAPILLARY
Glucose-Capillary: 90 mg/dL (ref 70–99)
Glucose-Capillary: 113 mg/dL — ABNORMAL HIGH (ref 70–99)
Glucose-Capillary: 133 mg/dL — ABNORMAL HIGH (ref 70–99)
Glucose-Capillary: 115 mg/dL — ABNORMAL HIGH (ref 70–99)

## 2022-03-06 MED ORDER — CHLORHEXIDINE GLUCONATE CLOTH 2 % EX PADS: 6.0000 | MEDICATED_PAD | Freq: Every day | CUTANEOUS | Status: DC

## 2022-03-06 MED ORDER — LEVETIRACETAM 500 MG PO TABS: 500.0000 mg | ORAL_TABLET | Freq: Two times a day (BID) | ORAL | 2 refills | Status: AC

## 2022-03-06 MED ORDER — ORAL CARE MOUTH RINSE: 15.0000 mL | OROMUCOSAL | Status: DC | PRN

## 2022-03-06 NOTE — Procedures (Signed)
Patient Name: Gerald Diaz  MRN: [id number]  Epilepsy Attending: Lora Havens  Referring Physician/Provider: Reubin Milan, MD  Date: 03/06/2022 Duration: 22.20 mins  Patient history: 66 year old patient with history of dementia, right nephrectomy and atrial fibrillation not on anticoagulation presents from SNF with new onset seizures. EEG to evaluate for seizure  Level of alertness: Awake  AEDs during EEG study: LEV  Technical aspects: This EEG study was done with scalp electrodes positioned according to the 10-20 International system of electrode placement. Electrical activity was reviewed with band pass filter of 1-70Hz , sensitivity of 7 uV/mm, display speed of 63mm/sec with a 60Hz  notched filter applied as appropriate. EEG data were recorded continuously and digitally stored.  Video monitoring was available and reviewed as appropriate.  Description: No clear posterior dominant rhythm was seen. EEG showed continuous generalized 5-9hz  theta slowing admixed with 2-3hz  delta slowing. Hyperventilation and photic stimulation were not performed.     ABNORMALITY - Continuous slow, generalized  IMPRESSION: This study is suggestive of moderate diffuse encephalopathy, nonspecific etiology. No seizures or epileptiform discharges were seen throughout the recording.  Kym Fenter Barbra Sarks

## 2022-03-06 NOTE — Progress Notes (Signed)
Patient was discharged back to SNF by [facility] at 2225. Patient's belongings and discharge package was given to patient. Report was given by RN day shift. Vital sign was taken before patient left the unit.

## 2022-03-06 NOTE — Discharge Summary (Signed)
Physician Discharge Summary  Gerald Diaz [id number] DOB: 10/17/1955 DOA: 03/05/2022  PCP: [name], MD  Admit date: 03/05/2022 Discharge date: 03/06/2022  Admitted From: Memory care unit Disposition: Memory care unit  Recommendations for Outpatient Follow-up:  Follow up with PCP in 1-2 weeks We will send referral to neurology for follow-up  Home Health: N/A Equipment/Devices: N/A  Discharge Condition: Fair CODE STATUS: DNR Diet recommendation: Regular diet  Discharge summary: 66 year old man with advanced dementia, A-fib brought from memory care unit with witnessed seizure while he was eating breakfast.  Lasted about 90 seconds.  Reportedly had another seizure that included bilateral arm flexion and jerking with eyes rolling back and head in the emergency room.  Reported similar syncopal-like event several months ago.  On arrival to the ER neurologically stable.  Poor baseline with difficulty speaking.  MRI of the brain was normal.  EEG was done and pending.  Patient was loaded with Keppra and started on Keppra for seizure prophylaxis and treatment.  Seen by neurology. Given second episode in 1 day and high risk of seizure recurrence, decided to treat. Patient was monitored in the hospital, due to his very advanced dementia and high risk of delirium legal guardian requested patient to be discharged back to memory care unit with medications.  Patient is on different medications for mood stabilization including Risperdal, sertraline and trazodone.  Atypical antipsychotics can reduce the seizure threshold, however he has very advanced memory issues and it will probably imbalance his currently status.  We decided to continue his medications.  We will send referral to neurology for outpatient follow-up.  This was communicated with the patient's legal guardian.  A shared decision was made for patient to go back to memory care unit.  Stable for discharge.   Discharge Diagnoses:   Principal Problem:   Seizure ([place]) Active Problems:   Dementia ([place])   Prediabetes   Elevated serum creatinine   Abnormal urinalysis   Macrocytosis   Paroxysmal atrial fibrillation [facility])    Discharge Instructions  Discharge Instructions     Ambulatory referral to Neurology   Complete by: As directed    An appointment is requested in approximately: 4 weeks   Diet - low sodium heart healthy   Complete by: As directed    Diet general   Complete by: As directed    Increase activity slowly   Complete by: As directed    Increase activity slowly   Complete by: As directed       Allergies as of 03/06/2022   No Known Allergies      Medication List     STOP taking these medications    eucerin lotion   fexofenadine 180 MG tablet Commonly known as: ALLEGRA       TAKE these medications    acetaminophen 500 MG tablet Commonly known as: TYLENOL Take 500 mg by mouth every 4 (four) hours as needed (pain or temp > 100.5).   aspirin EC 81 MG tablet Take 1 tablet (81 mg total) by mouth daily. Swallow whole.   diltiazem 180 MG 24 hr capsule Commonly known as: CARDIZEM CD Take 1 capsule (180 mg total) by mouth daily.   guaiFENesin-dextromethorphan 100-10 MG/5ML syrup Commonly known as: ROBITUSSIN DM Take 10 mLs by mouth every 4 (four) hours as needed for cough (congestion).   levETIRAcetam 500 MG tablet Commonly known as: Keppra Take 1 tablet (500 mg total) by mouth 2 (two) times daily.   ondansetron 4 MG tablet Commonly known  as: ZOFRAN Take 4 mg by mouth every 8 (eight) hours as needed for nausea or vomiting.   risperiDONE 0.5 MG tablet Commonly known as: RISPERDAL Take 0.5 mg by mouth 2 (two) times daily. 0800, 1400   sertraline 25 MG tablet Commonly known as: ZOLOFT Take 25 mg by mouth daily.   Tea Tree Oil Oil Apply 1 application  topically daily as needed (hairline scailness).   traZODone 50 MG tablet Commonly known as: DESYREL Take 50 mg by  mouth at bedtime.        No Known Allergies  Consultations: Neurology   Procedures/Studies: MR BRAIN WO CONTRAST  Result Date: 03/05/2022 CLINICAL DATA:  Mental status change, unknown cause EXAM: MRI HEAD WITHOUT CONTRAST TECHNIQUE: Multiplanar, multiecho pulse sequences of the brain and surrounding structures were obtained without intravenous contrast. COMPARISON:  CT head 12/03/2021. FINDINGS: Motion limited study. Brain: No acute infarction, hemorrhage, hydrocephalus, extra-axial collection or mass lesion. Cerebral atrophy. Began phi are within normal limits and symmetric. Vascular: Major arterial flow voids are maintained. Skull and upper cervical spine: Normal marrow signal. Sinuses/Orbits: Negative. Other: No mastoid effusions. IMPRESSION: Limited study without evidence of acute intracranial abnormality. Electronically Signed   By: Margaretha Sheffield M.D.   On: 03/05/2022 15:31   CT HEAD WO CONTRAST  Result Date: 03/05/2022 CLINICAL DATA:  Seizure. EXAM: CT HEAD WITHOUT CONTRAST TECHNIQUE: Contiguous axial images were obtained from the base of the skull through the vertex without intravenous contrast. RADIATION DOSE REDUCTION: This exam was performed according to the departmental dose-optimization program which includes automated exposure control, adjustment of the mA and/or kV according to patient size and/or use of iterative reconstruction technique. COMPARISON:  12/03/2021 FINDINGS: Brain: Stable age advanced cerebral atrophy, ventriculomegaly and periventricular white matter disease. No extra-axial fluid collections are identified. No CT findings for acute hemispheric infarction or intracranial hemorrhage. No mass lesions. The brainstem and cerebellum are normal. Vascular: Stable vascular calcifications. No aneurysm or hyperdense vessels. Skull: No skull fracture or bone lesions. Sinuses/Orbits: Ethmoid sinus disease. The other paranasal sinuses are clear. The mastoid air cells and middle  ear cavities are clear. The globes are intact. Other: No scalp lesions or scalp hematoma. IMPRESSION: 1. Stable age advanced cerebral atrophy, ventriculomegaly and periventricular white matter disease. 2. No acute intracranial findings or mass lesions. Electronically Signed   By: Marijo Sanes M.D.   On: 03/05/2022 11:26   (Echo, Carotid, EGD, Colonoscopy, ERCP)    Subjective: Patient seen and examined.  Looks at eyes, does not communicate.  Nursing reported that he had no trouble eating breakfast and taking his oral medications.   Discharge Exam: Vitals:   03/06/22 0524 03/06/22 1320  BP: 126/84 104/69  Pulse: 76 62  Resp: 18 18  Temp: 97.8 F (36.6 C) 98.1 F (36.7 C)  SpO2: 100% 99%   Vitals:   03/05/22 1801 03/05/22 2233 03/06/22 0524 03/06/22 1320  BP: 129/84 117/84 126/84 104/69  Pulse: 80 78 76 62  Resp: 17 16 18 18   Temp: (!) 97.5 F (36.4 C) 97.9 F (36.6 C) 97.8 F (36.6 C) 98.1 F (36.7 C)  TempSrc: Oral Oral  Oral  SpO2: 98% 99% 100% 99%  Weight:      Height:        General: Pt is alert, awake, not in acute distress. Does not follow commands.  Accepts food and medications with normal chewing.  Looks quiet.  Voices few words but not comprehensible. Cardiovascular: RRR, S1/S2 +, no rubs, no gallops  Respiratory: CTA bilaterally, no wheezing, no rhonchi Abdominal: Soft, NT, ND, bowel sounds + Extremities: no edema, no cyanosis    The results of significant diagnostics from this hospitalization (including imaging, microbiology, ancillary and laboratory) are listed below for reference.     Microbiology: No results found for this or any previous visit (from the past 240 hour(s)).   Labs: BNP (last 3 results) No results for input(s): "BNP" in the last 8760 hours. Basic Metabolic Panel: Recent Labs  Lab 03/05/22 1045 03/06/22 0531  NA 141 141  K 4.0 3.6  CL 109 107  CO2 24 26  GLUCOSE 124* 97  BUN 20 15  CREATININE 1.27* 1.09  CALCIUM 8.8* 8.4*  MG  2.3  --    Liver Function Tests: Recent Labs  Lab 03/05/22 1045 03/06/22 0531  AST 18 21  ALT 13 14  ALKPHOS 92 94  BILITOT 0.3 0.9  PROT 6.5 6.2*  ALBUMIN 3.6 3.5   No results for input(s): "LIPASE", "AMYLASE" in the last 168 hours. No results for input(s): "AMMONIA" in the last 168 hours. CBC: Recent Labs  Lab 03/05/22 1045 03/06/22 0531  WBC 9.8 9.4  NEUTROABS 7.8*  --   HGB 13.2 13.3  HCT 40.2 40.0  MCV 101.0* 100.3*  PLT 225 214   Cardiac Enzymes: No results for input(s): "CKTOTAL", "CKMB", "CKMBINDEX", "TROPONINI" in the last 168 hours. BNP: Invalid input(s): "POCBNP" CBG: Recent Labs  Lab 03/05/22 1024 03/05/22 2109 03/06/22 0718 03/06/22 1133  GLUCAP 151* 94 90 113*   D-Dimer No results for input(s): "DDIMER" in the last 72 hours. Hgb A1c Recent Labs    03/05/22 1024  HGBA1C 5.6   Lipid Profile No results for input(s): "CHOL", "HDL", "LDLCALC", "TRIG", "CHOLHDL", "LDLDIRECT" in the last 72 hours. Thyroid function studies No results for input(s): "TSH", "T4TOTAL", "T3FREE", "THYROIDAB" in the last 72 hours.  Invalid input(s): "FREET3" Anemia work up No results for input(s): "VITAMINB12", "FOLATE", "FERRITIN", "TIBC", "IRON", "RETICCTPCT" in the last 72 hours. Urinalysis    Component Value Date/Time   COLORURINE YELLOW 03/05/2022 1151   APPEARANCEUR CLEAR 03/05/2022 1151   LABSPEC 1.015 03/05/2022 1151   LABSPEC 1.015 03/13/2009 1428   PHURINE 6.0 03/05/2022 1151   GLUCOSEU NEGATIVE 03/05/2022 1151   HGBUR NEGATIVE 03/05/2022 1151   BILIRUBINUR NEGATIVE 03/05/2022 1151   KETONESUR NEGATIVE 03/05/2022 1151   PROTEINUR 30 (A) 03/05/2022 1151   NITRITE POSITIVE (A) 03/05/2022 1151   LEUKOCYTESUR TRACE (A) 03/05/2022 1151   Sepsis Labs Recent Labs  Lab 03/05/22 1045 03/06/22 0531  WBC 9.8 9.4   Microbiology No results found for this or any previous visit (from the past 240 hour(s)).   Time coordinating discharge:  32  minutes  SIGNED:   Barb Merino, MD  [facility] 03/06/2022, 3:19 PM

## 2022-03-06 NOTE — Plan of Care (Signed)

## 2022-03-06 NOTE — Progress Notes (Signed)
EEG complete - results pending 

## 2022-03-06 NOTE — NC FL2 (Signed)
[place] LEVEL OF CARE SCREENING TOOL     IDENTIFICATION  Patient Name: Gerald Diaz Birthdate: 04-29-1956 Sex: male Admission Date (Current Location): 03/05/2022  [facility] and [place] Number:  Herbalist and Address:  [facility],  [address] [place], [place]      Provider Number: [id number]  Attending Physician Name and Address:  Barb Merino, MD  Relative Name and Phone Number:  Legal Ewell Poe [phone]    Current Level of Care: Hospital Recommended Level of Care: [place], Memory Care Prior Approval Number:    Date Approved/Denied:   PASRR Number:    Discharge Plan: Other (Comment) (ALF)    Current Diagnoses: Patient Active Problem List   Diagnosis Date Noted   Seizure ([place]) 03/05/2022   Elevated serum creatinine 03/05/2022   Abnormal urinalysis 03/05/2022   Macrocytosis 03/05/2022   Paroxysmal atrial fibrillation ([place]) 03/05/2022   Prediabetes 09/27/2021   New onset a-fib ([place]) 09/23/2021   Paroxysmal atrial fibrillation with RVR ([place]) 09/23/2021   AKI (acute kidney injury) ([place]) 02/06/2020   S/p nephrectomy 02/06/2020   Dementia ([place]) 01/05/2020   Ureteral stone with hydronephrosis    ARF (acute renal failure) ([place]) 01/04/2020    Orientation RESPIRATION BLADDER Height & Weight     Self  Normal Incontinent, External catheter Weight: 180 lb (81.6 kg) Height:  6' (182.9 cm)  BEHAVIORAL SYMPTOMS/MOOD NEUROLOGICAL BOWEL NUTRITION STATUS      Incontinent Diet (Regular)  AMBULATORY STATUS COMMUNICATION OF NEEDS Skin   Limited Assist Does not communicate Normal                       Personal Care Assistance Level of Assistance  Bathing, Feeding, Dressing Bathing Assistance: Limited assistance Feeding assistance: Limited assistance Dressing Assistance: Limited assistance     Functional Limitations Info  Sight, Hearing, Speech Sight Info: Adequate Hearing Info:  Adequate Speech Info: Impaired    SPECIAL CARE FACTORS FREQUENCY                       Contractures Contractures Info: Not present    Additional Factors Info  Code Status, Allergies Code Status Info: DNR Allergies Info: No Known Allergies           Current Medications (03/06/2022):  This is the current hospital active medication list Current Facility-Administered Medications  Medication Dose Route Frequency Provider Last Rate Last Admin   acetaminophen (TYLENOL) tablet 650 mg  650 mg Oral Q6H PRN Reubin Milan, MD       Or   acetaminophen (TYLENOL) suppository 650 mg  650 mg Rectal Q6H PRN Reubin Milan, MD       aspirin EC tablet 81 mg  81 mg Oral Daily Reubin Milan, MD   81 mg at 03/06/22 0940   diltiazem (CARDIZEM CD) 24 hr capsule 180 mg  180 mg Oral Daily Reubin Milan, MD   180 mg at 03/06/22 0940   enoxaparin (LOVENOX) injection 40 mg  40 mg Subcutaneous Q24H Reubin Milan, MD   40 mg at 03/05/22 2134   levETIRAcetam (KEPPRA) IVPB 500 mg/100 mL premix  500 mg Intravenous Q12H Reubin Milan, MD 400 mL/hr at 03/06/22 1205 500 mg at 03/06/22 1205   LORazepam (ATIVAN) injection 2 mg  2 mg Intravenous Q4H PRN Reubin Milan, MD       ondansetron [facility]) tablet 4 mg  4  mg Oral Q6H PRN Bobette Mo, MD       Or   ondansetron [facility]) injection 4 mg  4 mg Intravenous Q6H PRN Bobette Mo, MD       Oral care mouth rinse  15 mL Mouth Rinse PRN Bobette Mo, MD       risperiDONE (RISPERDAL) tablet 0.5 mg  0.5 mg Oral BID Bobette Mo, MD   0.5 mg at 03/06/22 1456   sertraline (ZOLOFT) tablet 25 mg  25 mg Oral Daily Bobette Mo, MD   25 mg at 03/06/22 0940   traZODone (DESYREL) tablet 50 mg  50 mg Oral QHS Bobette Mo, MD   50 mg at 03/05/22 2134     Discharge Medications: Please see discharge summary for a list of discharge medications.  Relevant Imaging Results:  Relevant Lab  Results:   Additional Information SSN: [id number]  Otelia Santee, LCSW

## 2022-03-06 NOTE — TOC Transition Note (Signed)
Transition of Care [facility]) - CM/SW Discharge Note   Patient Details  Name: Gerald Diaz MRN: [id number] Date of Birth: May 17, 1955  Transition of Care [facility]) CM/SW Contact:  Vassie Moselle, LCSW Phone Number: 03/06/2022, 3:24 PM   Clinical Narrative:    Pt is to return to [place] at [facility]. CSW left voicemail with pt's legal guardian, Luci Bank to confirm discharge plans. RN to call report to [phone].  FL-2 faxed to [phone]. Pt will be transported to facility via [facility].   Final next level of care: Memory Care Barriers to Discharge: No Barriers Identified   Patient Goals and CMS Choice Patient states their goals for this hospitalization and ongoing recovery are:: To return to [facility] CMS Medicare.gov Compare Post Acute Care list provided to:: Legal Guardian Choice offered to / list presented to : [facility] POA / [place]  Discharge Placement              Patient chooses bed at: [facility] Patient to be transferred to facility by: [place] Name of family member notified: Luci Bank Patient and family notified of of transfer: 03/06/22  Discharge Plan and Services In-house Referral: NA Discharge Planning Services: NA Post Acute Care Choice: Resumption of Svcs/PTA Provider, Nursing Home          DME Arranged: N/A DME Agency: NA                  Social Determinants of Health (SDOH) Interventions     Readmission Risk Interventions    02/09/2020   11:58 AM  Readmission Risk Prevention Plan  Post Dischage Appt Not Complete  Appt Comments d/c date tbd  Medication Screening Complete  Transportation Screening Complete

## 2022-04-14 ENCOUNTER — Encounter: Payer: Self-pay | Admitting: Neurology

## 2022-04-14 ENCOUNTER — Ambulatory Visit: Payer: Medicare HMO | Admitting: Neurology

## 2023-04-04 DEATH — deceased
